# Patient Record
Sex: Female | Born: 1960 | Race: Black or African American | Hispanic: No | Marital: Married | Smoking: Former smoker
Health system: Southern US, Community
[De-identification: ages and names within clinical notes are randomized; demographics above are authoritative.]

## PROBLEM LIST (undated history)

## (undated) DIAGNOSIS — D869 Sarcoidosis, unspecified: Secondary | ICD-10-CM

## (undated) DIAGNOSIS — N1832 Chronic kidney disease, stage 3b: Secondary | ICD-10-CM

## (undated) DIAGNOSIS — E079 Disorder of thyroid, unspecified: Secondary | ICD-10-CM

## (undated) DIAGNOSIS — E785 Hyperlipidemia, unspecified: Secondary | ICD-10-CM

## (undated) DIAGNOSIS — Z9889 Other specified postprocedural states: Secondary | ICD-10-CM

## (undated) DIAGNOSIS — K219 Gastro-esophageal reflux disease without esophagitis: Secondary | ICD-10-CM

## (undated) DIAGNOSIS — Z87442 Personal history of urinary calculi: Secondary | ICD-10-CM

## (undated) DIAGNOSIS — R112 Nausea with vomiting, unspecified: Secondary | ICD-10-CM

## (undated) DIAGNOSIS — L309 Dermatitis, unspecified: Secondary | ICD-10-CM

## (undated) DIAGNOSIS — D649 Anemia, unspecified: Secondary | ICD-10-CM

## (undated) DIAGNOSIS — I10 Essential (primary) hypertension: Secondary | ICD-10-CM

## (undated) DIAGNOSIS — G4730 Sleep apnea, unspecified: Secondary | ICD-10-CM

## (undated) DIAGNOSIS — M109 Gout, unspecified: Secondary | ICD-10-CM

## (undated) DIAGNOSIS — T8859XA Other complications of anesthesia, initial encounter: Secondary | ICD-10-CM

## (undated) DIAGNOSIS — C73 Malignant neoplasm of thyroid gland: Secondary | ICD-10-CM

## (undated) DIAGNOSIS — H409 Unspecified glaucoma: Secondary | ICD-10-CM

## (undated) DIAGNOSIS — K743 Primary biliary cirrhosis: Secondary | ICD-10-CM

## (undated) DIAGNOSIS — K7581 Nonalcoholic steatohepatitis (NASH): Secondary | ICD-10-CM

## (undated) DIAGNOSIS — E119 Type 2 diabetes mellitus without complications: Secondary | ICD-10-CM

## (undated) DIAGNOSIS — Z85850 Personal history of malignant neoplasm of thyroid: Secondary | ICD-10-CM

## (undated) HISTORY — DX: Nonalcoholic steatohepatitis (NASH): K75.81

## (undated) HISTORY — DX: Dermatitis, unspecified: L30.9

## (undated) HISTORY — DX: Malignant neoplasm of thyroid gland: C73

## (undated) HISTORY — PX: COLONOSCOPY: SHX174

## (undated) HISTORY — PX: CATARACT EXTRACTION: SUR2

## (undated) HISTORY — DX: Hyperlipidemia, unspecified: E78.5

## (undated) HISTORY — DX: Primary biliary cirrhosis: K74.3

## (undated) HISTORY — PX: TUBAL LIGATION: SHX77

## (undated) HISTORY — DX: Type 2 diabetes mellitus without complications: E11.9

## (undated) HISTORY — DX: Gastro-esophageal reflux disease without esophagitis: K21.9

## (undated) HISTORY — PX: GASTRIC BYPASS: SHX52

## (undated) HISTORY — DX: Unspecified glaucoma: H40.9

## (undated) HISTORY — PX: OTHER SURGICAL HISTORY: SHX169

## (undated) HISTORY — DX: Sarcoidosis, unspecified: D86.9

## (undated) HISTORY — DX: Personal history of malignant neoplasm of thyroid: Z85.850

## (undated) HISTORY — DX: Essential (primary) hypertension: I10

## (undated) HISTORY — DX: Gout, unspecified: M10.9

## (undated) HISTORY — DX: Disorder of thyroid, unspecified: E07.9

## (undated) HISTORY — DX: Chronic kidney disease, stage 3b: N18.32

## (undated) HISTORY — DX: Anemia, unspecified: D64.9

---

## 2015-12-12 HISTORY — PX: KNEE SURGERY: SHX244

## 2016-12-11 LAB — HM COLONOSCOPY

## 2020-07-13 LAB — HM DIABETES EYE EXAM

## 2020-12-31 LAB — HM DIABETES EYE EXAM

## 2021-07-13 ENCOUNTER — Ambulatory Visit: Payer: Managed Care, Other (non HMO) | Admitting: Medical

## 2021-07-13 ENCOUNTER — Other Ambulatory Visit: Payer: Self-pay

## 2021-07-13 ENCOUNTER — Encounter: Payer: Self-pay | Admitting: Medical

## 2021-07-13 ENCOUNTER — Other Ambulatory Visit: Payer: Self-pay | Admitting: Medical

## 2021-07-13 ENCOUNTER — Ambulatory Visit
Admission: RE | Admit: 2021-07-13 | Discharge: 2021-07-13 | Disposition: A | Discharge status: Home / Self Care | Payer: Managed Care, Other (non HMO) | Source: Ambulatory Visit | Attending: Medical | Admitting: Medical

## 2021-07-13 VITALS — BP 120/80 | HR 88 | Ht 62.5 in | Wt 220.8 lb

## 2021-07-13 DIAGNOSIS — G8929 Other chronic pain: Secondary | ICD-10-CM | POA: Insufficient documentation

## 2021-07-13 DIAGNOSIS — M542 Cervicalgia: Secondary | ICD-10-CM | POA: Diagnosis not present

## 2021-07-13 DIAGNOSIS — N3941 Urge incontinence: Secondary | ICD-10-CM

## 2021-07-13 DIAGNOSIS — E790 Hyperuricemia without signs of inflammatory arthritis and tophaceous disease: Secondary | ICD-10-CM

## 2021-07-13 DIAGNOSIS — L309 Dermatitis, unspecified: Secondary | ICD-10-CM

## 2021-07-13 DIAGNOSIS — R159 Full incontinence of feces: Secondary | ICD-10-CM

## 2021-07-13 DIAGNOSIS — E785 Hyperlipidemia, unspecified: Secondary | ICD-10-CM

## 2021-07-13 DIAGNOSIS — M549 Dorsalgia, unspecified: Secondary | ICD-10-CM | POA: Diagnosis not present

## 2021-07-13 DIAGNOSIS — Z96652 Presence of left artificial knee joint: Secondary | ICD-10-CM | POA: Insufficient documentation

## 2021-07-13 DIAGNOSIS — G4700 Insomnia, unspecified: Secondary | ICD-10-CM

## 2021-07-13 DIAGNOSIS — R7989 Other specified abnormal findings of blood chemistry: Secondary | ICD-10-CM | POA: Insufficient documentation

## 2021-07-13 DIAGNOSIS — E890 Postprocedural hypothyroidism: Secondary | ICD-10-CM

## 2021-07-13 DIAGNOSIS — R202 Paresthesia of skin: Secondary | ICD-10-CM

## 2021-07-13 DIAGNOSIS — Z85850 Personal history of malignant neoplasm of thyroid: Secondary | ICD-10-CM

## 2021-07-13 DIAGNOSIS — R7301 Impaired fasting glucose: Secondary | ICD-10-CM | POA: Insufficient documentation

## 2021-07-13 DIAGNOSIS — I10 Essential (primary) hypertension: Secondary | ICD-10-CM

## 2021-07-13 DIAGNOSIS — Z9889 Other specified postprocedural states: Secondary | ICD-10-CM

## 2021-07-13 DIAGNOSIS — M1A9XX0 Chronic gout, unspecified, without tophus (tophi): Secondary | ICD-10-CM

## 2021-07-13 MED ORDER — TRIAMCINOLONE ACETONIDE 0.1 % EX CREA: 1.0000 "application " | TOPICAL_CREAM | Freq: Two times a day (BID) | CUTANEOUS | 0 refills | Status: DC

## 2021-07-13 MED ORDER — ZOLPIDEM TARTRATE ER 12.5 MG PO TBCR: 12.5000 mg | EXTENDED_RELEASE_TABLET | Freq: Every evening | ORAL | 0 refills | Status: DC | PRN

## 2021-07-13 NOTE — Patient Instructions (Signed)
Please go to [facility] for your  back xrays.   Their hours are 8am - 4:30 pm Monday - Friday.  Take your insurance card with you.  [facility] [phone]  [facility] [address], [place], [place] [address]  [address] [place], [place] [address]

## 2021-07-13 NOTE — Progress Notes (Signed)
Subjective:  Lauren [name] is a 60 y.o. female who presents for Chief Complaint  Patient presents with   new pt get established    Rash on arms x 1 month, back pain. Seen back in february 2022 for this.  Depression      Here as a new patient to establish care.   Moved to [facility] in May from [place].  Has back issues.  Had MRI of cervical spine 01/2021.  Gets pain in neck and throughout back upper and lower back since 12/2020.   No injury, no trauma, no fall.  Has hx/o poor posture, slouching, and hx/o rotator cuff repair both shoulder.  Hasn't seen back specialist.   The MRI was ordered by ortho since they were seeing her for the shoulder issues.  Has hx/o knee pains.   Lately hip and low back has been an issues.  No prior imaging of low back or hips.  Lately neck and upper back not as bad.   Using ibuprofen '800mg'$  some.   Aleve OTC really helps, but she note shx/o kidney issues.  She had cannabis prescription in [place], and would use vape for this.  She reports she is clumsy.  Sometimes legs give out.   Denies numbness or tingling in legs, but has had some weakness.  Currently unemployed.    Exercise - walks 2-3 miles per day.  Stretches regularly.   Occasionally gets numbness in hands, which is what led to the neck MRI.  No other numbness, tingling or weakness.   Has had some incontinence with stood at times, also with urine at times.  Currently feels she can control bowels and bladder ok.  No saddle anesthesia.    She notes rash on arms at antecubital area and neck x 1 mo.   Sometimes gets on dorsal hands if out in sun too long .  Is itchy, using OTC cream for itching and rash.  She notes hx/o thyroid cancer, s/p thyroidectomy.    She has a history of gout.  She is not on any daily prevention medicine.  She has as needed Indocin '50mg'$  BID  She had lab work done in January 2022.  She has this on her phone record  High blood pressure-compliant with medication  Hyperlipidemia-compliant with  medication  GERD-compliant with famotidine  No other aggravating or relieving factors.    No other c/o.  Past Medical History:  Diagnosis Date   Anemia    History of thyroid cancer    Hyperlipidemia    Hypertension    Thyroid disease     Current Outpatient Medications on File Prior to Visit  Medication Sig Dispense Refill   amLODipine-valsartan (EXFORGE) 5-160 MG tablet Take 1 tablet by mouth daily.     atorvastatin (LIPITOR) 80 MG tablet Take 80 mg by mouth daily.     chlorthalidone (HYGROTON) 25 MG tablet Take 25 mg by mouth daily.     diclofenac (VOLTAREN) 75 MG EC tablet Take 75 mg by mouth 2 (two) times daily.     famotidine (PEPCID) 20 MG tablet Take 20 mg by mouth 2 (two) times daily.     indomethacin (INDOCIN) 50 MG capsule Take 50 mg by mouth 2 (two) times daily with a meal.     No current facility-administered medications on file prior to visit.      The following portions of the patient's history were reviewed and updated as appropriate: allergies, current medications, past family history, past medical history, past social history,  past surgical history and problem list.  ROS Otherwise as in subjective above    Objective: BP 120/80   Pulse 88   Ht 5' 2.5" (1.588 m)   Wt 220 lb 12.8 oz (100.2 kg)   SpO2 98%   BMI 39.74 kg/m   General appearance: alert, no distress, well developed, well nourished, obese African American female Back: mild left lumbar parapsinal and midline lumbar spine tendnerss.  No swelling, no deformity.  Back flexion to about 100 degrees, ext normal.  No obvious scoliosis Hips with normal ROM bilat without pain, nontender, no obvious swelling or deformity Tender over left knee and there is a surgical scar of left knee, no swelling, no other leg tenderness or deformity Legs seem neurovascularly intact Arms also seen neurovascularly intact without tenderness or deformity Heart: RRR, normal S1, S2, no murmurs Lungs: CTA bilaterally, no  wheezes, rhonchi, or rales Pulses: 2+ radial pulses, 2+ pedal pulses, normal cap refill Ext: no edema Psych: Pleasant, answers questions appropriately     Assessment: Encounter Diagnoses  Name Primary?   Chronic neck pain Yes   Chronic back pain, unspecified back location, unspecified back pain laterality    Urge incontinence of urine    Incontinence of feces, unspecified fecal incontinence type    Paresthesia of arm    Eczema, unspecified type    H/O left knee surgery    History of thyroid cancer    Essential hypertension, benign    Hyperlipidemia, unspecified hyperlipidemia type    Postoperative hypothyroidism    Impaired fasting blood sugar    Elevated uric acid in blood    Elevated serum creatinine    Insomnia, unspecified type    Chronic gout without tophus, unspecified cause, unspecified site      Plan: Today we reviewed over her numerous issues.  She has a lot of chronic issues.  I reviewed MRI report from cervical spine from 01/2021 on her phone.  It showed cervical spondylosis and recent narrowing in C4-C5, C5-C6.    I reviewed labs she had on her phone from a chart from January 2022.  Labs showed uric acid over 10, hemoglobin A1c was over 9%, glucose was about 166, triglycerides over 300, CBC normal, creatinine elevated around 1.5, alkaline phosphatase was elevated.  Rest of labs seem within normal limits  Given her ongoing back pain this we will send for baseline x-rays.  She will likely need referral to a back specialist  She may need referral to gastro specialist given the fecal incontinence occasionally.  She will call back with the dose of her thyroid medicine since she does not have her pill bottle today.  Hypertension-continue current medication  Hyperlipidemia-continue current medication  Update labs as below today  Insomnia-refilled Ambien for as needed use.  Cautioned on limiting use of this and discussed the risk and benefits of medication  Given  elevated creatinine and her medication prior kidney issues, advised we avoid NSAIDs.  I did not refill diclofenac today and advised we will need to find other solutions to Arthritis and pain given kidney issues.  Elevated uric acid, hx/o gout - will likely need to start preventative medication such as allopurinol    Lauren [name] was seen today for new pt get established.  Diagnoses and all orders for this visit:  Chronic neck pain -     DG Lumbar Spine Complete; Future -     DG Thoracic Spine 2 View; Future  Chronic back pain, unspecified back location, unspecified back  pain laterality -     DG Lumbar Spine Complete; Future -     DG Thoracic Spine 2 View; Future  Urge incontinence of urine -     DG Lumbar Spine Complete; Future -     DG Thoracic Spine 2 View; Future  Incontinence of feces, unspecified fecal incontinence type -     DG Lumbar Spine Complete; Future -     DG Thoracic Spine 2 View; Future  Paresthesia of arm  Eczema, unspecified type  H/O left knee surgery  History of thyroid cancer  Essential hypertension, benign -     Comprehensive metabolic panel  Hyperlipidemia, unspecified hyperlipidemia type  Postoperative hypothyroidism -     TSH + free T4  Impaired fasting blood sugar -     Hemoglobin A1c  Elevated uric acid in blood -     Uric acid  Elevated serum creatinine  Insomnia, unspecified type  Chronic gout without tophus, unspecified cause, unspecified site  Other orders -     triamcinolone cream (KENALOG) 0.1 %; Apply 1 application topically 2 (two) times daily. -     zolpidem (AMBIEN CR) 12.5 MG CR tablet; Take 1 tablet (12.5 mg total) by mouth at bedtime as needed for sleep.  Spent > 45 minutes face to face with patient in discussion of symptoms, evaluation, plan and recommendations.     Follow up: pending xrays

## 2021-07-14 LAB — TSH+FREE T4
Free T4: 1.07 ng/dL (ref 0.82–1.77)
TSH: 3.11 u[IU]/mL (ref 0.450–4.500)

## 2021-07-14 LAB — COMPREHENSIVE METABOLIC PANEL
ALT: 43 IU/L — ABNORMAL HIGH (ref 0–32)
AST: 49 IU/L — ABNORMAL HIGH (ref 0–40)
Albumin/Globulin Ratio: 1.8 (ref 1.2–2.2)
Albumin: 5.2 g/dL — ABNORMAL HIGH (ref 3.8–4.9)
Alkaline Phosphatase: 236 IU/L — ABNORMAL HIGH (ref 44–121)
BUN/Creatinine Ratio: 14 (ref 9–23)
BUN: 26 mg/dL — ABNORMAL HIGH (ref 6–24)
Bilirubin Total: 0.3 mg/dL (ref 0.0–1.2)
CO2: 25 mmol/L (ref 20–29)
Calcium: 10.6 mg/dL — ABNORMAL HIGH (ref 8.7–10.2)
Chloride: 102 mmol/L (ref 96–106)
Creatinine, Ser: 1.84 mg/dL — ABNORMAL HIGH (ref 0.57–1.00)
Globulin, Total: 2.9 g/dL (ref 1.5–4.5)
Glucose: 124 mg/dL — ABNORMAL HIGH (ref 65–99)
Potassium: 4.1 mmol/L (ref 3.5–5.2)
Sodium: 144 mmol/L (ref 134–144)
Total Protein: 8.1 g/dL (ref 6.0–8.5)
eGFR: 31 mL/min/{1.73_m2} — ABNORMAL LOW (ref >59)

## 2021-07-14 LAB — HEMOGLOBIN A1C
Est. average glucose Bld gHb Est-mCnc: 183 mg/dL
Hgb A1c MFr Bld: 8 % — ABNORMAL HIGH (ref 4.8–5.6)

## 2021-07-14 LAB — URIC ACID: Uric Acid: 10.6 mg/dL — ABNORMAL HIGH (ref 3.0–7.2)

## 2021-07-18 ENCOUNTER — Other Ambulatory Visit: Payer: Self-pay

## 2021-07-18 ENCOUNTER — Telehealth: Payer: Self-pay

## 2021-07-18 ENCOUNTER — Ambulatory Visit: Payer: Managed Care, Other (non HMO) | Admitting: Medical

## 2021-07-18 VITALS — BP 122/72 | HR 69 | Wt 220.2 lb

## 2021-07-18 DIAGNOSIS — E890 Postprocedural hypothyroidism: Secondary | ICD-10-CM | POA: Diagnosis not present

## 2021-07-18 DIAGNOSIS — Z794 Long term (current) use of insulin: Secondary | ICD-10-CM | POA: Insufficient documentation

## 2021-07-18 DIAGNOSIS — E119 Type 2 diabetes mellitus without complications: Secondary | ICD-10-CM | POA: Insufficient documentation

## 2021-07-18 DIAGNOSIS — E790 Hyperuricemia without signs of inflammatory arthritis and tophaceous disease: Secondary | ICD-10-CM

## 2021-07-18 DIAGNOSIS — R748 Abnormal levels of other serum enzymes: Secondary | ICD-10-CM | POA: Insufficient documentation

## 2021-07-18 DIAGNOSIS — M1A9XX0 Chronic gout, unspecified, without tophus (tophi): Secondary | ICD-10-CM

## 2021-07-18 DIAGNOSIS — I10 Essential (primary) hypertension: Secondary | ICD-10-CM | POA: Diagnosis not present

## 2021-07-18 DIAGNOSIS — E785 Hyperlipidemia, unspecified: Secondary | ICD-10-CM | POA: Diagnosis not present

## 2021-07-18 DIAGNOSIS — Z85850 Personal history of malignant neoplasm of thyroid: Secondary | ICD-10-CM

## 2021-07-18 DIAGNOSIS — E1165 Type 2 diabetes mellitus with hyperglycemia: Secondary | ICD-10-CM

## 2021-07-18 DIAGNOSIS — N1832 Chronic kidney disease, stage 3b: Secondary | ICD-10-CM

## 2021-07-18 MED ORDER — ALLOPURINOL 100 MG PO TABS: 100.0000 mg | ORAL_TABLET | Freq: Every day | ORAL | 6 refills | Status: DC

## 2021-07-18 MED ORDER — EMPAGLIFLOZIN 10 MG PO TABS: 10.0000 mg | ORAL_TABLET | Freq: Every day | ORAL | 2 refills | Status: DC

## 2021-07-18 MED ORDER — FREESTYLE LIBRE 14 DAY SENSOR MISC: 2.0000 | 5 refills | Status: DC

## 2021-07-18 NOTE — Telephone Encounter (Signed)
Per [name] labs for tomorrow do not need to be fasting.  Tried to reach pt by phone and unable to leave message

## 2021-07-18 NOTE — Progress Notes (Signed)
Subjective:  Lauren [name] is a 60 y.o. female who presents for Chief Complaint  Patient presents with   discuss lab results    Discuss lab results.    Here with husband to discuss the recent abnormal labs from last visit.   She notes hx/o thyroid cancer, s/p thyroidectomy.    She has a history of gout.  She is not on any daily prevention medicine.  She has as needed Indocin '50mg'$  BID  She had lab work done in January 2022.  She has this on her phone record  High blood pressure-compliant with medication  Hyperlipidemia-compliant with medication  GERD-compliant with famotidine   Past Medical History:  Diagnosis Date   Anemia    History of thyroid cancer    Hyperlipidemia    Hypertension    Thyroid disease    Current Outpatient Medications on File Prior to Visit  Medication Sig Dispense Refill   amLODipine-valsartan (EXFORGE) 5-160 MG tablet Take 1 tablet by mouth daily.     atorvastatin (LIPITOR) 80 MG tablet Take 80 mg by mouth daily.     chlorthalidone (HYGROTON) 25 MG tablet Take 12.5 mg by mouth daily.     famotidine (PEPCID) 20 MG tablet Take 20 mg by mouth 2 (two) times daily.     triamcinolone cream (KENALOG) 0.1 % Apply 1 application topically 2 (two) times daily. 30 g 0   zolpidem (AMBIEN CR) 12.5 MG CR tablet Take 1 tablet (12.5 mg total) by mouth at bedtime as needed for sleep. 30 tablet 0   No current facility-administered medications on file prior to visit.   ROS as in subjective    Objective: BP 122/72   Pulse 69   Wt 220 lb 3.2 oz (99.9 kg)   BMI 39.63 kg/m   Gen: wd, wn, nad Otherwise not examined    Assessment: Encounter Diagnoses  Name Primary?   Essential hypertension, benign Yes   Stage 3b chronic kidney disease (HCC)    Postoperative hypothyroidism    Hyperlipidemia, unspecified hyperlipidemia type    History of thyroid cancer    Alkaline phosphatase elevation    Elevated uric acid in blood    Serum calcium elevated    Type 2  diabetes mellitus with hyperglycemia, without long-term current use of insulin (HCC)    Chronic gout without tophus, unspecified cause, unspecified site    Elevated liver enzymes      Plan: Diabetes Unfortunately your labs are in the diabetic range I recommend we begin Jardiance medication 1 tablet daily to help control blood sugars Inease water intake Monitor blood sugars and write these numbers down Begin monitoring with Free Style LIbre device    Chronic kidney disease stage IIIb Due to abnormal kidney function, and in order to protect your kidneys, I recommend you avoid medications that can harm the kidneys such as ibuprofen, Aleve, Advil, Motrin, Naprosyn, or prescription anti-inflammatories which are used for pain, inflammation, and arthritis.   You should avoid dehydration which can harm the kidneys. Hydrate with plenty of water such as 80 to 100 ounces of water daily The goal is to keep blood pressure and blood sugars under control Stop diclofenac   High cholesterol For now continue atorvastatin Lipitor 80 mg daily We are checking additional labs in regards to elevated liver test I suspect the liver tests are related to fatty liver disease However if we do not find any specific cause we may need to stop the atorvastatin temporarily   Alkaline phosphatase elevated  I am checking additional labs and regards to evaluation of this issue This could be due to low vitamin D or other issues   Elevated liver test Return at your convenience for additional labs We will plan to get an ultrasound of the liver as well I suspect your elevated liver test are due to fatty liver disease Recommendations for fatty liver disease is healthy low-fat diet regular exercise and weight loss   Elevated uric acid Begin allopurinol 100 mg daily for gout prevention and reducing uric acid in the blood For the first 2 weeks as you begin allopurinol, take your indomethacin/Indocin daily to help  reduce flareup of gout After 1 to 2 weeks I would stop the Indocin Other options for dealing with gout flare include Tylenol over-the-counter, tart cherry juice or other prescription medications I recommend we treat the elevated uric acid to protect the kidneys and to reduce gout For the meantime, reduce your chlorthalidone dose to 1/2 tablet daily    Lauren [name] was seen today for discuss lab results.  Diagnoses and all orders for this visit:  Essential hypertension, benign  Stage 3b chronic kidney disease ([place])  Postoperative hypothyroidism  Hyperlipidemia, unspecified hyperlipidemia type -     Hepatitis C antibody; Future -     Hepatitis B surface antigen; Future -     Iron, TIBC and Ferritin Panel; Future -     US Abdomen Limited; Future -     Alkaline Phosphatase, Isoenzymes; Future  History of thyroid cancer  Alkaline phosphatase elevation -     Hepatitis C antibody; Future -     Hepatitis B surface antigen; Future -     Iron, TIBC and Ferritin Panel; Future -     US Abdomen Limited; Future -     Alkaline Phosphatase, Isoenzymes; Future  Elevated uric acid in blood  Serum calcium elevated  Type 2 diabetes mellitus with hyperglycemia, without long-term current use of insulin (HCC)  Chronic gout without tophus, unspecified cause, unspecified site  Elevated liver enzymes -     Hepatitis C antibody; Future -     Hepatitis B surface antigen; Future -     Iron, TIBC and Ferritin Panel; Future -     US Abdomen Limited; Future -     Alkaline Phosphatase, Isoenzymes; Future   Spent > 45 minutes face to face with patient in discussion of symptoms, evaluation, plan and recommendations.     Follow up: this week for additional labs

## 2021-07-18 NOTE — Patient Instructions (Signed)
  Diabetes Unfortunately your labs are in the diabetic range I recommend we begin Jardiance medication 1 tablet daily to help control blood sugars Inease water intake Monitor blood sugars and write these numbers down Begin monitoring with Free Style LIbre device    Chronic kidney disease stage IIIb Due to abnormal kidney function, and in order to protect your kidneys, I recommend you avoid medications that can harm the kidneys such as ibuprofen, Aleve, Advil, Motrin, Naprosyn, or prescription anti-inflammatories which are used for pain, inflammation, and arthritis.   You should avoid dehydration which can harm the kidneys. Hydrate with plenty of water such as 80 to 100 ounces of water daily The goal is to keep blood pressure and blood sugars under control Stop diclofenac   High cholesterol For now continue atorvastatin Lipitor 80 mg daily We are checking additional labs in regards to elevated liver test I suspect the liver tests are related to fatty liver disease However if we do not find any specific cause we may need to stop the atorvastatin temporarily   Alkaline phosphatase elevated I am checking additional labs and regards to evaluation of this issue This could be due to low vitamin D or other issues   Elevated liver test Return at your convenience for additional labs We will plan to get an ultrasound of the liver as well I suspect your elevated liver test are due to fatty liver disease Recommendations for fatty liver disease is healthy low-fat diet regular exercise and weight loss   Elevated uric acid Begin allopurinol 100 mg daily for gout prevention and reducing uric acid in the blood For the first 2 weeks as you begin allopurinol, take your indomethacin/Indocin daily to help reduce flareup of gout After 1 to 2 weeks I would stop the Indocin Other options for dealing with gout flare include Tylenol over-the-counter, tart cherry juice or other prescription medications I  recommend we treat the elevated uric acid to protect the kidneys and to reduce gout For the meantime, reduce your chlorthalidone dose to 1/2 tablet daily

## 2021-07-19 ENCOUNTER — Other Ambulatory Visit: Payer: Managed Care, Other (non HMO)

## 2021-07-19 DIAGNOSIS — E785 Hyperlipidemia, unspecified: Secondary | ICD-10-CM

## 2021-07-19 DIAGNOSIS — R748 Abnormal levels of other serum enzymes: Secondary | ICD-10-CM

## 2021-07-20 ENCOUNTER — Encounter: Payer: Self-pay | Admitting: Internal Medicine

## 2021-07-22 LAB — HEPATITIS C ANTIBODY: Hep C Virus Ab: 0.2 s/co ratio (ref 0.0–0.9)

## 2021-07-22 LAB — ALKALINE PHOSPHATASE, ISOENZYMES
Alkaline Phosphatase: 206 IU/L — ABNORMAL HIGH (ref 44–121)
BONE FRACTION: 19 % (ref 14–68)
INTESTINAL FRAC.: 6 % (ref 0–18)
LIVER FRACTION: 75 % (ref 18–85)

## 2021-07-22 LAB — IRON,TIBC AND FERRITIN PANEL
Ferritin: 143 ng/mL (ref 15–150)
Iron Saturation: 24 % (ref 15–55)
Iron: 74 ug/dL (ref 27–159)
Total Iron Binding Capacity: 305 ug/dL (ref 250–450)
UIBC: 231 ug/dL (ref 131–425)

## 2021-07-22 LAB — HEPATITIS B SURFACE ANTIGEN: Hepatitis B Surface Ag: NEGATIVE

## 2021-07-25 ENCOUNTER — Telehealth: Payer: Self-pay

## 2021-07-25 NOTE — Telephone Encounter (Signed)
P.A. JARDIANCE completed and approved, called pharmacy went thru for $25.  Activated discount card went thru for $10.  Called pt and informed

## 2021-07-26 ENCOUNTER — Telehealth: Payer: Self-pay | Admitting: Internal Medicine

## 2021-07-26 ENCOUNTER — Encounter: Payer: Self-pay | Admitting: Internal Medicine

## 2021-07-26 NOTE — Telephone Encounter (Signed)
Patient wants to know about jardiance. She wants to know if she should be taking this since she has chronic kidney disease.   Also she wants to know what her BS should be reading range

## 2021-07-26 NOTE — Telephone Encounter (Signed)
Tried to call pt but vm is not set up

## 2021-07-27 ENCOUNTER — Encounter: Payer: Self-pay | Admitting: Internal Medicine

## 2021-07-27 ENCOUNTER — Other Ambulatory Visit: Payer: Self-pay | Admitting: Medical

## 2021-07-27 MED ORDER — INSULIN GLARGINE (1 UNIT DIAL) 300 UNIT/ML ~~LOC~~ SOPN: 10.0000 [IU] | PEN_INJECTOR | Freq: Every day | SUBCUTANEOUS | 5 refills | Status: DC

## 2021-07-27 MED ORDER — BD PEN NEEDLE NANO U/F 32G X 4 MM MISC: 1.0000 | Freq: Every day | 2 refills | Status: DC

## 2021-07-27 NOTE — Telephone Encounter (Signed)
Tried to call pt but vm full

## 2021-07-27 NOTE — Telephone Encounter (Signed)
Pt will be willing to start on insulin 10 units at night. Please send something in.   Also patient wants to know if you can send in a prenatal vitamin to her pharmacy. Her last doctor said she was deficient in vitamins and told her to go on prenatal vitamin but never followed up

## 2021-07-28 ENCOUNTER — Encounter: Payer: Self-pay | Admitting: Internal Medicine

## 2021-07-29 ENCOUNTER — Encounter: Payer: Self-pay | Admitting: Internal Medicine

## 2021-08-01 ENCOUNTER — Ambulatory Visit
Admission: RE | Admit: 2021-08-01 | Discharge: 2021-08-01 | Disposition: A | Discharge status: Home / Self Care | Payer: Managed Care, Other (non HMO) | Source: Ambulatory Visit | Attending: Medical | Admitting: Medical

## 2021-08-01 DIAGNOSIS — R748 Abnormal levels of other serum enzymes: Secondary | ICD-10-CM

## 2021-08-01 DIAGNOSIS — E785 Hyperlipidemia, unspecified: Secondary | ICD-10-CM

## 2021-08-04 ENCOUNTER — Other Ambulatory Visit: Payer: Self-pay | Admitting: Medical

## 2021-08-04 ENCOUNTER — Telehealth: Payer: Self-pay | Admitting: Internal Medicine

## 2021-08-04 MED ORDER — PRENATAL VITAMIN 27-0.8 MG PO TABS: 1.0000 | ORAL_TABLET | Freq: Every day | ORAL | 3 refills | Status: DC

## 2021-08-04 NOTE — Telephone Encounter (Signed)
Pt would like a prenatal vitamin [facility]

## 2021-08-05 ENCOUNTER — Other Ambulatory Visit: Payer: Self-pay | Admitting: Medical

## 2021-08-05 MED ORDER — ALLOPURINOL 100 MG PO TABS: 100.0000 mg | ORAL_TABLET | Freq: Every day | ORAL | 1 refills | Status: DC

## 2021-08-05 MED ORDER — ATORVASTATIN CALCIUM 80 MG PO TABS: 80.0000 mg | ORAL_TABLET | Freq: Every day | ORAL | 1 refills | Status: DC

## 2021-08-05 MED ORDER — ZOLPIDEM TARTRATE ER 12.5 MG PO TBCR: 12.5000 mg | EXTENDED_RELEASE_TABLET | Freq: Every evening | ORAL | 0 refills | Status: DC | PRN

## 2021-08-05 MED ORDER — FREESTYLE LIBRE 14 DAY SENSOR MISC: 2.0000 | 3 refills | Status: DC

## 2021-08-05 MED ORDER — INSULIN GLARGINE (1 UNIT DIAL) 300 UNIT/ML ~~LOC~~ SOPN: 10.0000 [IU] | PEN_INJECTOR | Freq: Every day | SUBCUTANEOUS | 1 refills | Status: DC

## 2021-08-10 LAB — HM DIABETES EYE EXAM

## 2021-08-11 ENCOUNTER — Encounter: Payer: Self-pay | Admitting: Internal Medicine

## 2021-08-29 ENCOUNTER — Ambulatory Visit: Payer: Managed Care, Other (non HMO) | Admitting: Family Medicine

## 2021-08-29 ENCOUNTER — Encounter: Payer: Self-pay | Admitting: Family Medicine

## 2021-08-29 ENCOUNTER — Encounter: Payer: Self-pay | Admitting: Internal Medicine

## 2021-08-29 ENCOUNTER — Other Ambulatory Visit: Payer: Self-pay

## 2021-08-29 VITALS — BP 138/88 | HR 80 | Ht 62.5 in | Wt 216.4 lb

## 2021-08-29 DIAGNOSIS — M1A9XX0 Chronic gout, unspecified, without tophus (tophi): Secondary | ICD-10-CM | POA: Diagnosis not present

## 2021-08-29 DIAGNOSIS — E1122 Type 2 diabetes mellitus with diabetic chronic kidney disease: Secondary | ICD-10-CM

## 2021-08-29 DIAGNOSIS — I10 Essential (primary) hypertension: Secondary | ICD-10-CM

## 2021-08-29 DIAGNOSIS — N1832 Chronic kidney disease, stage 3b: Secondary | ICD-10-CM | POA: Diagnosis not present

## 2021-08-29 DIAGNOSIS — Z794 Long term (current) use of insulin: Secondary | ICD-10-CM

## 2021-08-29 DIAGNOSIS — E790 Hyperuricemia without signs of inflammatory arthritis and tophaceous disease: Secondary | ICD-10-CM

## 2021-08-29 DIAGNOSIS — M79672 Pain in left foot: Secondary | ICD-10-CM | POA: Diagnosis not present

## 2021-08-29 LAB — HM PAP SMEAR: HM Pap smear: ABNORMAL

## 2021-08-29 LAB — RESULTS CONSOLE HPV: CHL HPV: NEGATIVE

## 2021-08-29 MED ORDER — PREDNISONE 10 MG PO TABS: ORAL_TABLET | ORAL | 0 refills | Status: DC

## 2021-08-29 MED ORDER — COLCHICINE 0.6 MG PO TABS
ORAL_TABLET | ORAL | 0 refills | Status: DC
Start: 2021-08-29 — End: 2021-09-15

## 2021-08-29 NOTE — Patient Instructions (Addendum)
  Go to [facility] ([address]) tomorrow to get an xray of your left foot. It might be gout, but could be other things (stress fracture, tendonitis).  We will try colchicine today, as this would treat any gout component. If pain is not better, start steroids tomorrow morning.  Take 40mg  for 2 days, then 30mg  for 1 days, then 20mg  for 2 days and 10mg  for 2 days.  If your sugars are still very high, you can taper down faster (by doing only 1 day each at 30 mg, 20mg  and 10mg ).  You should follow up with [name] within the next 1-2 weks. You should have your labs repeated, and possibly your allopurinol adjusted (vs changed to Uloric, depending on creatinine and uric acid levels).  If sugars are over 200 (fasting), increase your Toujeo to 13 Units, adjust as needed and contact us.

## 2021-08-29 NOTE — Progress Notes (Signed)
Chief Complaint  Patient presents with   Gout    Having a gout flare-first started with her right foot great toe, her foot is now swollen and painful-almost a week now. Takes daily allopurinol '100mg'$  daily, tart cherry supplement daily and has been taking tart cherry juice for the last 3-4 days.    Patient has a known history of gout. She presents today with complaint of foot pain.  The pain started Wednesday 9/14 in the left great toe, then pain spread to the whole foot (bottom of the foot along arch, and across the top of the foot. Heel is the only part not painful.) She took ibuprofen (per doctor on call '800mg'$ ), but didn't want to continue this because she knew it wasn't good for her kidneys. She had some leftover percocet from shoulder surgery--helped with pain, last dose yesterday.  She is a fairly new patient of [facility].  At last visit, she was told to cut the chlorthalidone in half, about a month ago.  Couldn't cut it in half, so stopped taking it. BP's at home have been 115-130/80 or less.  Has been walking 3 miles/day (none in the last 2 weeks, but didn't stop related to any pain)  Lab Results  Component Value Date   LABURIC 10.6 (H) 07/13/2021     Chemistry      Component Value Date/Time   NA 144 07/13/2021 1215   K 4.1 07/13/2021 1215   CL 102 07/13/2021 1215   CO2 25 07/13/2021 1215   BUN 26 (H) 07/13/2021 1215   CREATININE 1.84 (H) 07/13/2021 1215      Component Value Date/Time   CALCIUM 10.6 (H) 07/13/2021 1215   ALKPHOS 206 (H) 07/19/2021 1324   AST 49 (H) 07/13/2021 1215   ALT 43 (H) 07/13/2021 1215   BILITOT 0.3 07/13/2021 1215     DM--on insulin for about a month. On 10 Units of Toujeo.  Sugars are 130-140 range.  2 weeks ago a 1am she had at low at [address] [facility] alarm woke her up), no lows since.   Lab Results  Component Value Date   HGBA1C 8.0 (H) 07/13/2021   She also reports some issues with pain in her back/hips, and down both legs, that she has discussed  with [name] (had MRI in past).  She feels slight discomfort coming up her left calf.  Denies calf swelling.  PMH, PSH, SH reviewed  Outpatient Encounter Medications as of 08/29/2021  Medication Sig   allopurinol (ZYLOPRIM) 100 MG tablet Take 1 tablet (100 mg total) by mouth daily.   amLODipine-valsartan (EXFORGE) 5-160 MG tablet Take 1 tablet by mouth daily.   atorvastatin (LIPITOR) 80 MG tablet Take 1 tablet (80 mg total) by mouth daily.   Continuous Blood Gluc Sensor (FREESTYLE LIBRE 14 DAY SENSOR) MISC 2 each by Does not apply route every 14 (fourteen) days.   insulin glargine, 1 Unit Dial, (TOUJEO) 300 UNIT/ML Solostar Pen Inject 10 Units into the skin daily.   Insulin Pen Needle (BD PEN NEEDLE NANO U/F) 32G X 4 MM MISC 1 each by Does not apply route at bedtime.   LUMIGAN 0.01 % SOLN 1 drop at bedtime.   Misc Natural Products (TART CHERRY ADVANCED PO) Take 1 capsule by mouth daily.   Prenatal Vit-Fe Fumarate-FA (PRENATAL VITAMIN) 27-0.8 MG TABS Take 1 tablet by mouth daily.   triamcinolone cream (KENALOG) 0.1 % Apply 1 application topically 2 (two) times daily.   chlorthalidone (HYGROTON) 25 MG tablet Take  12.5 mg by mouth daily. (Patient not taking: Reported on 08/29/2021)   famotidine (PEPCID) 20 MG tablet Take 20 mg by mouth 2 (two) times daily. (Patient not taking: Reported on 08/29/2021)   zolpidem (AMBIEN CR) 12.5 MG CR tablet Take 1 tablet (12.5 mg total) by mouth at bedtime as needed for sleep. (Patient not taking: Reported on 08/29/2021)   No facility-administered encounter medications on file as of 08/29/2021.   Allergies  Allergen Reactions   Oxycodone Itching   ROS:  No fever, some nausea today, no vomiting. No URI symptoms, headaches, dizziness, chest pain, abdominal pain, dysuria. No hypoglycemia (didn't have sx with glu 68), no polydipsia, polyuria. See HPI.   PHYSICAL EXAM:  BP 138/88   Pulse 80   Ht 5' 2.5" (1.588 m)   Wt 216 lb 6.4 oz (98.2 kg)   BMI 38.95 kg/m    Pleasant, obese female, in a wheelchair, accompanied by her husband. HEENT: conjunctiva and sclera are clear, wearing mask Extremities: normal pulses.  Some mild erythema across the top of the left foot (proximal to mid metatarsals). Some inflammation at the L 1st MTP noted, and some mild erythema and swelling noted across the toes. Nontender at plantar fascia insertion at calcaneous. No pitting edema, calves are nontender, no cords. She is alert and oriented. Gait not observed (in wheelchair due to foot pain). Psych: normal mood, affect, hygiene and grooming  Glu 103 when she checked with her Freestyle while in exam room  ASSESSMENT/PLAN:  Left foot pain - Ddx reviewed. Sounds like started as gout. Cannot r/o stress fracture, OA as contributing. Check x-ray. Treat for gout - Plan: DG Foot Complete Left  Chronic gout without tophus, unspecified cause, unspecified site - Colchicine today. If not better, start prednisone--given DM, will avoid higher doses. Pt needs to be able to walk/bear weight in 2 days.  - Plan: colchicine 0.6 MG tablet, predniSONE (DELTASONE) 10 MG tablet  Type 2 diabetes mellitus with stage 3b chronic kidney disease, with long-term current use of insulin (HCC) - Cont monitoring sugar; discussed titration of Toujeo if sugars >200 due to steroids (then taper back down to avoid hypoglycemia)  Stage 3b chronic kidney disease (HCC) - Avoid NSAIDs  Essential hypertension, benign - In pain now. Per home values, BP okay since chlorthalidone stopped  Elevated uric acid in blood - stopped diuretic and started '100mg'$  allopurinol. Now poss in a flare, not good time to recheck. f/u with [name] within 2 weeks for recheck uric acid  I spent 40 minutes dedicated to the care of this patient, including pre-visit review of records, face to face time, post-visit ordering of testing and documentation.   Go to [facility] (either [address]) tomorrow to get an xray of  your left foot. It might be gout, but could be other things (stress fracture, tendonitis).  We will try colchicine today, as this would treat any gout component. If pain is not better, start steroids tomorrow morning.  Take '40mg'$  for 2 days, then '30mg'$  for 1 days, then '20mg'$  for 2 days and '10mg'$  for 2 days.  If your sugars are still very high, you can taper down faster (by doing only 1 day each at 30 mg, '20mg'$  and '10mg'$ ).  You should follow up with [name] within the next 1-2 weks. You should have your labs repeated, and possibly your allopurinol adjusted (vs changed to Uloric, depending on creatinine and uric acid levels).

## 2021-08-30 ENCOUNTER — Ambulatory Visit
Admission: RE | Admit: 2021-08-30 | Discharge: 2021-08-30 | Disposition: A | Discharge status: Home / Self Care | Payer: Managed Care, Other (non HMO) | Source: Ambulatory Visit | Attending: Family Medicine | Admitting: Family Medicine

## 2021-08-30 DIAGNOSIS — M79672 Pain in left foot: Secondary | ICD-10-CM

## 2021-09-06 ENCOUNTER — Encounter: Payer: Self-pay | Admitting: Internal Medicine

## 2021-09-08 ENCOUNTER — Encounter: Payer: Self-pay | Admitting: Medical

## 2021-09-12 ENCOUNTER — Encounter: Payer: Self-pay | Admitting: Medical

## 2021-09-12 ENCOUNTER — Ambulatory Visit: Payer: Managed Care, Other (non HMO) | Admitting: Medical

## 2021-09-12 ENCOUNTER — Other Ambulatory Visit: Payer: Self-pay

## 2021-09-12 VITALS — BP 128/80 | HR 76 | Wt 213.8 lb

## 2021-09-12 DIAGNOSIS — J301 Allergic rhinitis due to pollen: Secondary | ICD-10-CM | POA: Insufficient documentation

## 2021-09-12 DIAGNOSIS — R748 Abnormal levels of other serum enzymes: Secondary | ICD-10-CM

## 2021-09-12 DIAGNOSIS — N1832 Chronic kidney disease, stage 3b: Secondary | ICD-10-CM | POA: Diagnosis not present

## 2021-09-12 DIAGNOSIS — E790 Hyperuricemia without signs of inflammatory arthritis and tophaceous disease: Secondary | ICD-10-CM

## 2021-09-12 DIAGNOSIS — E1165 Type 2 diabetes mellitus with hyperglycemia: Secondary | ICD-10-CM

## 2021-09-12 DIAGNOSIS — M1A9XX0 Chronic gout, unspecified, without tophus (tophi): Secondary | ICD-10-CM

## 2021-09-12 DIAGNOSIS — M25572 Pain in left ankle and joints of left foot: Secondary | ICD-10-CM

## 2021-09-12 DIAGNOSIS — M25571 Pain in right ankle and joints of right foot: Secondary | ICD-10-CM | POA: Insufficient documentation

## 2021-09-12 DIAGNOSIS — E785 Hyperlipidemia, unspecified: Secondary | ICD-10-CM

## 2021-09-12 DIAGNOSIS — G4733 Obstructive sleep apnea (adult) (pediatric): Secondary | ICD-10-CM

## 2021-09-12 DIAGNOSIS — G4700 Insomnia, unspecified: Secondary | ICD-10-CM

## 2021-09-12 LAB — HM MAMMOGRAPHY

## 2021-09-12 NOTE — Progress Notes (Signed)
Subjective:  Lauren [name] is a 60 y.o. female who presents for Chief Complaint  Patient presents with   2 week follow-up    2 week Follow-up on gout .      Here for med check.  Diabetes, diagnosed earlier this year.  She had been prediabetic prior to this year.  She is compliant with Toujeo 10 units nightly but she has had some low readings.  She has a freestyle libre device and the alarms go off sometimes between 2 and 5 AM showing blood sugars down into the 68 or 69 range at times.  No foot concerns.  She does see her eye doctor yearly.  For the most part fasting blood sugars in the morning have been less than 130  Hyperlipidemia-compliant with statin without complaint.  She has had recent gout problems.  She was here recently and saw Dr. Tomi Bamberger for really bad gout flare, the worst she has had.  She came in a wheelchair.  The medicine she was given help although last week her right foot started to act up.  The left foot was the last 1 to act up.  She is taking allopurinol 100 mg daily.  She started the prednisone that was given last visit.  Sleep apnea-compliant with CPAP, last sleep study greater than 8 years ago  Insomnia-she takes Ambien CR but feels like it does not help as much of late.  She would like to try a different medication.  She can get the sleep fine but she seems to wake up early in the morning and cannot get back to sleep.  She had heard about a medication called Dwana Curd she was interested in trying  She saw a TV commercial about medication Carrington Clamp and was curious if she needed to be on this or not  She and her husband moved here in recent months.  She has not established with gastroenterology here  She sees new orthopedic tomorrow about her left knee , Dr. Vanetta Mulders  Lost 45 lb since early 2022 intentionally.  She also has history of gastric sleeve surgery  She was on chlorthalidone prior to stop this after last visit as she could not break them in half as we  discussed.  This was stopped due to potential for aggravating uric acid.  She is compliant with her other blood pressure medication amlodipine valsartan.  Home blood pressure readings lately has been running normal.  No other aggravating or relieving factors.    No other c/o.   Past Medical History:  Diagnosis Date   Anemia    History of thyroid cancer    Hyperlipidemia    Hypertension    Thyroid disease     Current Outpatient Medications on File Prior to Visit  Medication Sig Dispense Refill   allopurinol (ZYLOPRIM) 100 MG tablet Take 1 tablet (100 mg total) by mouth daily. 90 tablet 1   amLODipine-valsartan (EXFORGE) 5-160 MG tablet Take 1 tablet by mouth daily.     atorvastatin (LIPITOR) 80 MG tablet Take 1 tablet (80 mg total) by mouth daily. 90 tablet 1   chlorthalidone (HYGROTON) 25 MG tablet Take 12.5 mg by mouth daily.     colchicine 0.6 MG tablet Take 2 tablets at onset of gout pain.  Take an additional 1 tablet an hour later, if needed 3 tablet 0   Continuous Blood Gluc Sensor (FREESTYLE LIBRE 2 SENSOR) MISC by Does not apply route.     famotidine (PEPCID) 20 MG tablet Take 20  mg by mouth 2 (two) times daily.     insulin glargine, 1 Unit Dial, (TOUJEO) 300 UNIT/ML Solostar Pen Inject 10 Units into the skin daily. 15 mL 1   Insulin Pen Needle (BD PEN NEEDLE NANO U/F) 32G X 4 MM MISC 1 each by Does not apply route at bedtime. 100 each 2   LUMIGAN 0.01 % SOLN 1 drop at bedtime.     Misc Natural Products (TART CHERRY ADVANCED PO) Take 1 capsule by mouth daily.     predniSONE (DELTASONE) 10 MG tablet Take as directed. Take in the morning with food. 17 tablet 0   Prenatal Vit-Fe Fumarate-FA (PRENATAL VITAMIN) 27-0.8 MG TABS Take 1 tablet by mouth daily. 90 tablet 3   triamcinolone cream (KENALOG) 0.1 % Apply 1 application topically 2 (two) times daily. 30 g 0   zolpidem (AMBIEN CR) 12.5 MG CR tablet Take 1 tablet (12.5 mg total) by mouth at bedtime as needed for sleep. 90 tablet 0    No current facility-administered medications on file prior to visit.    The following portions of the patient's history were reviewed and updated as appropriate: allergies, current medications, past family history, past medical history, past social history, past surgical history and problem list.  ROS Otherwise as in subjective above    Objective: BP 128/80   Pulse 76   Wt 213 lb 12.8 oz (97 kg)   BMI 38.48 kg/m   BP Readings from Last 3 Encounters:  09/12/21 128/80  08/29/21 138/88  07/18/21 122/72   Wt Readings from Last 3 Encounters:  09/12/21 213 lb 12.8 oz (97 kg)  08/29/21 216 lb 6.4 oz (98.2 kg)  07/18/21 220 lb 3.2 oz (99.9 kg)    General appearance: alert, no distress, well developed, well nourished, African American female Neck: supple, no lymphadenopathy, no thyromegaly, no masses, no bruits Heart: RRR, normal S1, S2, no murmurs Lungs: CTA bilaterally, no wheezes, rhonchi, or rales Pulses: 2+ radial pulses, 2+ pedal pulses, normal cap refill Ext: no edema     Assessment: Encounter Diagnoses  Name Primary?   Type 2 diabetes mellitus with hyperglycemia, without long-term current use of insulin (HCC) Yes   Stage 3b chronic kidney disease (HCC)    Hyperlipidemia, unspecified hyperlipidemia type    Elevated uric acid in blood    Chronic gout without tophus, unspecified cause, unspecified site    Alkaline phosphatase elevation    Insomnia, unspecified type    Pain in joints of both feet    Allergic rhinitis due to pollen, unspecified seasonality    OSA (obstructive sleep apnea)       Plan: Diabetes Routine labs today She had been on metformin in the past before gastric sleeve surgery, but this was not started recently given kidney marker She is taking insulin at night 10 units of Toujeo at bedtime.  She does have some readings on her freestyle monitor showing 69 glucose on various days in the early hours such as 2 AM to 5 AM. Consider switching to  Toujeo in the morning.  Consider other medications to help control sugars  CKD 3 recheck labs today avoid NSAIDs discussed importance of keeping blood pressure and blood sugars in control consider nephrology consult  Hyperlipidemia Fasting labs today, continue statin  Elevated uric acid, chronic gout, joint pains Labs today, we will likely increase allopurinol to 300 mg daily She is currently on prednisone which is elevating her sugar some Avoid NSAIDs due to kidney function Other lab  evaluation today  Elevated LFTs , elevated alkaline phosphatase with liver fraction being the most predominant I reviewed over the labs we did back in August.  At that time negative hepatitis B and C, iron levels and ferritin normal, abdominal ultrasound showed fatty liver disease Continue efforts to lose weight through healthy diet and exercise Consider GI consult We discussed that liver biopsy would be the definitive answer to clearing up any uncertainty about her labs, but she has not had this prior  Insomnia We discussed the risk of sleep aids, sleep medications Consider updated sleep evaluation.  Her last sleep study was greater than 5 years ago We discussed medication options.  She feels like her Ambien is no longer working.  We discussed the new medicine she was inquiring about compared to other options like trazodone and Belsomra I will consider her options and get back in touch with her   Obstructive sleep apnea-continue CPAP   Allergic rhinitis-advise she use over-the-counter nasal spray such as Flonase or allergy pill such as Zyrtec daily   Tazaria was seen today for 2 week follow-up.  Diagnoses and all orders for this visit:  Type 2 diabetes mellitus with hyperglycemia, without long-term current use of insulin (HCC) -     Comprehensive metabolic panel -     Hemoglobin A1c  Stage 3b chronic kidney disease (HCC)  Hyperlipidemia, unspecified hyperlipidemia type -     Comprehensive  metabolic panel -     Lipid panel  Elevated uric acid in blood -     Uric acid  Chronic gout without tophus, unspecified cause, unspecified site -     Uric acid -     Sedimentation rate  Alkaline phosphatase elevation -     Comprehensive metabolic panel -     Sedimentation rate  Insomnia, unspecified type  Pain in joints of both feet -     Uric acid -     CBC -     CK -     Sedimentation rate  Allergic rhinitis due to pollen, unspecified seasonality  OSA (obstructive sleep apnea)  Spent > 45 minutes face to face with patient in discussion of symptoms, evaluation, plan and recommendations.    Follow up: pending labs

## 2021-09-13 ENCOUNTER — Ambulatory Visit (HOSPITAL_BASED_OUTPATIENT_CLINIC_OR_DEPARTMENT_OTHER)
Admission: RE | Admit: 2021-09-13 | Discharge: 2021-09-13 | Disposition: A | Discharge status: Home / Self Care | Payer: Managed Care, Other (non HMO) | Source: Ambulatory Visit | Attending: Orthopaedic Surgery | Admitting: Orthopaedic Surgery

## 2021-09-13 ENCOUNTER — Encounter (HOSPITAL_BASED_OUTPATIENT_CLINIC_OR_DEPARTMENT_OTHER): Payer: Self-pay | Admitting: Orthopaedic Surgery

## 2021-09-13 ENCOUNTER — Other Ambulatory Visit (HOSPITAL_BASED_OUTPATIENT_CLINIC_OR_DEPARTMENT_OTHER): Payer: Self-pay | Admitting: Orthopaedic Surgery

## 2021-09-13 ENCOUNTER — Ambulatory Visit (INDEPENDENT_AMBULATORY_CARE_PROVIDER_SITE_OTHER): Payer: Managed Care, Other (non HMO) | Admitting: Orthopaedic Surgery

## 2021-09-13 VITALS — BP 151/104 | Ht 62.0 in | Wt 208.0 lb

## 2021-09-13 DIAGNOSIS — Z9889 Other specified postprocedural states: Secondary | ICD-10-CM | POA: Insufficient documentation

## 2021-09-13 DIAGNOSIS — M1712 Unilateral primary osteoarthritis, left knee: Secondary | ICD-10-CM

## 2021-09-13 LAB — COMPREHENSIVE METABOLIC PANEL
ALT: 41 IU/L — ABNORMAL HIGH (ref 0–32)
AST: 25 IU/L (ref 0–40)
Albumin/Globulin Ratio: 1.8 (ref 1.2–2.2)
Albumin: 4.6 g/dL (ref 3.8–4.9)
Alkaline Phosphatase: 252 IU/L — ABNORMAL HIGH (ref 44–121)
BUN/Creatinine Ratio: 12 (ref 9–23)
BUN: 17 mg/dL (ref 6–24)
Bilirubin Total: <0.2 mg/dL (ref 0.0–1.2)
CO2: 23 mmol/L (ref 20–29)
Calcium: 9.8 mg/dL (ref 8.7–10.2)
Chloride: 104 mmol/L (ref 96–106)
Creatinine, Ser: 1.47 mg/dL — ABNORMAL HIGH (ref 0.57–1.00)
Globulin, Total: 2.6 g/dL (ref 1.5–4.5)
Glucose: 138 mg/dL — ABNORMAL HIGH (ref 70–99)
Potassium: 3.9 mmol/L (ref 3.5–5.2)
Sodium: 141 mmol/L (ref 134–144)
Total Protein: 7.2 g/dL (ref 6.0–8.5)
eGFR: 41 mL/min/{1.73_m2} — ABNORMAL LOW (ref >59)

## 2021-09-13 LAB — LIPID PANEL
Chol/HDL Ratio: 2.8 ratio (ref 0.0–4.4)
Cholesterol, Total: 160 mg/dL (ref 100–199)
HDL: 57 mg/dL (ref >39)
LDL Chol Calc (NIH): 64 mg/dL (ref 0–99)
Triglycerides: 246 mg/dL — ABNORMAL HIGH (ref 0–149)
VLDL Cholesterol Cal: 39 mg/dL (ref 5–40)

## 2021-09-13 LAB — CBC
Hematocrit: 36.5 % (ref 34.0–46.6)
Hemoglobin: 11.7 g/dL (ref 11.1–15.9)
MCH: 26.3 pg — ABNORMAL LOW (ref 26.6–33.0)
MCHC: 32.1 g/dL (ref 31.5–35.7)
MCV: 82 fL (ref 79–97)
Platelets: 427 10*3/uL (ref 150–450)
RBC: 4.45 x10E6/uL (ref 3.77–5.28)
RDW: 14.6 % (ref 11.7–15.4)
WBC: 8.9 10*3/uL (ref 3.4–10.8)

## 2021-09-13 LAB — HEMOGLOBIN A1C
Est. average glucose Bld gHb Est-mCnc: 160 mg/dL
Hgb A1c MFr Bld: 7.2 % — ABNORMAL HIGH (ref 4.8–5.6)

## 2021-09-13 LAB — SEDIMENTATION RATE: Sed Rate: 26 mm/hr (ref 0–40)

## 2021-09-13 LAB — CK: Total CK: 304 U/L — ABNORMAL HIGH (ref 32–182)

## 2021-09-13 LAB — URIC ACID: Uric Acid: 4.7 mg/dL (ref 3.0–7.2)

## 2021-09-13 NOTE — Progress Notes (Signed)
Chief Complaint: left knee     History of Present Illness:   Lauren [name] is a 60 y.o. female left knee pain status post patellofemoral knee replacement done in [place] 5 years prior.  She says that she has had pain following this although this is progressively worse over the last several years.  She endorses popping and clicking about the knee.  She has pain with weightbearing on the knee.  Her pain is worse at the end of a long day.  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  She has not tried any specific bracing.    Surgical History:   Left knee patellofemoral placement 2015  PMH/PSH/Family History/Social History/Meds/Allergies:    Past Medical History:  Diagnosis Date   Anemia    History of thyroid cancer    Hyperlipidemia    Hypertension    Thyroid disease    Past Surgical History:  Procedure Laterality Date   GASTRIC BYPASS     KNEE SURGERY Left 2017   Social History   Socioeconomic History   Marital status: Married    Spouse name: Not on file   Number of children: Not on file   Years of education: Not on file   Highest education level: Not on file  Occupational History   Not on file  Tobacco Use   Smoking status: Former    Types: E-cigarettes   Smokeless tobacco: Never  Substance and Sexual Activity   Alcohol use: Never   Drug use: Not Currently    Comment: gummies   Sexual activity: Yes  Other Topics Concern   Not on file  Social History Narrative   Not on file   Social Determinants of Health   Financial Resource Strain: Not on file  Food Insecurity: Not on file  Transportation Needs: Not on file  Physical Activity: Not on file  Stress: Not on file  Social Connections: Not on file   No family history on file. Allergies  Allergen Reactions   Oxycodone Itching   Current Outpatient Medications  Medication Sig Dispense Refill    allopurinol (ZYLOPRIM) 100 MG tablet Take 1 tablet (100 mg total) by mouth daily. 90 tablet 1   amLODipine-valsartan (EXFORGE) 5-160 MG tablet Take 1 tablet by mouth daily.     atorvastatin (LIPITOR) 80 MG tablet Take 1 tablet (80 mg total) by mouth daily. 90 tablet 1   chlorthalidone (HYGROTON) 25 MG tablet Take 12.5 mg by mouth daily.     colchicine 0.6 MG tablet Take 2 tablets at onset of gout pain.  Take an additional 1 tablet an hour later, if needed 3 tablet 0   Continuous Blood Gluc Sensor (FREESTYLE LIBRE 2 SENSOR) MISC by Does not apply route.     famotidine (PEPCID) 20 MG tablet Take 20 mg by mouth 2 (two) times daily.     insulin glargine, 1 Unit Dial, (TOUJEO) 300 UNIT/ML Solostar Pen Inject 10 Units into the skin daily. 15 mL 1   Insulin Pen Needle (BD PEN NEEDLE NANO U/F) 32G X 4 MM MISC 1 each by Does not apply route at bedtime. 100 each 2   LUMIGAN 0.01 % SOLN 1 drop at bedtime.     Misc Natural Products (TART CHERRY ADVANCED  PO) Take 1 capsule by mouth daily.     predniSONE (DELTASONE) 10 MG tablet Take as directed. Take in the morning with food. 17 tablet 0   Prenatal Vit-Fe Fumarate-FA (PRENATAL VITAMIN) 27-0.8 MG TABS Take 1 tablet by mouth daily. 90 tablet 3   triamcinolone cream (KENALOG) 0.1 % Apply 1 application topically 2 (two) times daily. 30 g 0   zolpidem (AMBIEN CR) 12.5 MG CR tablet Take 1 tablet (12.5 mg total) by mouth at bedtime as needed for sleep. 90 tablet 0   No current facility-administered medications for this visit.   No results found.  Review of Systems:   A ROS was performed including pertinent positives and negatives as documented in the HPI.  Physical Exam :   Constitutional: NAD and appears stated age Neurological: Alert and oriented Psych: Appropriate affect and cooperative Blood pressure (!) 151/104, height 5\' 2"  (1.575 m), weight 208 lb (94.3 kg).   Comprehensive Musculoskeletal Exam:     Musculoskeletal Exam  Gait Normal  Alignment  Normal   Right Left  Inspection Normal Normal  Palpation    Tenderness None Patellofemoral  Crepitus None Lateral patellar  Effusion None None  Range of Motion    Extension 0 0  Flexion 135 135  Strength    Extension 5/5 5/5  Flexion 5/5 5/5  Ligament Exam     Generalized Laxity No No  Lachman Negative Negative   Pivot Shift Negative Negative  Anterior Drawer Negative Negative  Valgus at 0 Negative Negative  Valgus at 20 Negative Negative  Varus at 0 0 0  Varus at 20   0 0  Posterior Drawer at 90 0 0  Vascular/Lymphatic Exam    Edema None None  Venous Stasis Changes No No  Distal Circulation Normal Normal  Neurologic    Light Touch Sensation Intact Intact  Special Tests:      Imaging:   Xray (4 views left knee): She has mild to moderate arthritis involving the predominantly medial compartment.  There is lateral patellar tilt with significant osteophyte formation about the lateral patellofemoral joint   I personally reviewed and interpreted the radiographs.   Assessment:   60 year old female with a history of left patellofemoral replacement in 2015.  Overall she continues to have persistent pain.  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  We discussed different treatment possibilities including bracing.  Specifically we will trial a patella strap brace in order to offload the developed lateral patellofemoral joint where she does have some residual impingement.  We tried this time today and she experienced significant improvement so would like to keep this.  I did also discussed with her injections.  I would like to stay away from steroid injections given the fact that she does have patellofemoral replacement in place.  We just discussed the role for PRP although I was somewhat hesitant on this given her residual lateral patellofemoral osteophytes which are more of a mechanical phenomenon.  She does have a history of lumbar disc herniation  which she was being previously treated in [place] for I do believe that she is a candidate for an epidural steroid injection given the fact that she is not able to take systemic anti-inflammatories due to her sarcoidosis.  We will plan for referral for possible injection.  Plan :    -Left knee patella brace provided -Referral for consideration of epidural steroid injection given her lumbar stenosis type pain and inability to take NSAIDs  I personally saw and evaluated the patient, and participated in the management and treatment plan.  Vanetta Mulders, MD Attending Physician, Orthopedic Surgery  This document was dictated using Dragon voice recognition software. A reasonable attempt at proof reading has been made to minimize errors.

## 2021-09-15 ENCOUNTER — Other Ambulatory Visit: Payer: Self-pay | Admitting: Medical

## 2021-09-15 DIAGNOSIS — M1A9XX0 Chronic gout, unspecified, without tophus (tophi): Secondary | ICD-10-CM

## 2021-09-15 MED ORDER — BELSOMRA 15 MG PO TABS: 1.0000 | ORAL_TABLET | Freq: Every day | ORAL | 1 refills | Status: DC

## 2021-09-15 MED ORDER — ALLOPURINOL 100 MG PO TABS: 200.0000 mg | ORAL_TABLET | Freq: Every day | ORAL | 1 refills | Status: DC

## 2021-09-15 MED ORDER — COLCHICINE 0.6 MG PO TABS: 0.6000 mg | ORAL_TABLET | Freq: Two times a day (BID) | ORAL | 1 refills | Status: DC

## 2021-09-20 ENCOUNTER — Encounter: Payer: Self-pay | Admitting: Internal Medicine

## 2021-11-12 ENCOUNTER — Other Ambulatory Visit: Payer: Self-pay | Admitting: Medical

## 2021-12-08 ENCOUNTER — Telehealth: Payer: Self-pay | Admitting: Internal Medicine

## 2021-12-08 NOTE — Telephone Encounter (Signed)
Pt was notified.  

## 2021-12-08 NOTE — Telephone Encounter (Signed)
Pt is in [place] until Jan 5th and she is having some arthitis flare up and she wants to know how much tylenol can she take and for how long can she take it for.

## 2021-12-23 ENCOUNTER — Ambulatory Visit (INDEPENDENT_AMBULATORY_CARE_PROVIDER_SITE_OTHER): Payer: Managed Care, Other (non HMO) | Admitting: Medical

## 2021-12-23 ENCOUNTER — Other Ambulatory Visit: Payer: Self-pay

## 2021-12-23 VITALS — BP 130/86 | HR 78 | Temp 97.4°F | Wt 216.6 lb

## 2021-12-23 DIAGNOSIS — M25571 Pain in right ankle and joints of right foot: Secondary | ICD-10-CM

## 2021-12-23 DIAGNOSIS — M79671 Pain in right foot: Secondary | ICD-10-CM

## 2021-12-23 DIAGNOSIS — N1832 Chronic kidney disease, stage 3b: Secondary | ICD-10-CM

## 2021-12-23 DIAGNOSIS — E162 Hypoglycemia, unspecified: Secondary | ICD-10-CM | POA: Diagnosis not present

## 2021-12-23 DIAGNOSIS — E1165 Type 2 diabetes mellitus with hyperglycemia: Secondary | ICD-10-CM

## 2021-12-23 DIAGNOSIS — E790 Hyperuricemia without signs of inflammatory arthritis and tophaceous disease: Secondary | ICD-10-CM

## 2021-12-23 DIAGNOSIS — R748 Abnormal levels of other serum enzymes: Secondary | ICD-10-CM

## 2021-12-23 MED ORDER — HYDROCODONE-ACETAMINOPHEN 5-325 MG PO TABS: 1.0000 | ORAL_TABLET | Freq: Four times a day (QID) | ORAL | 0 refills | Status: DC | PRN

## 2021-12-23 MED ORDER — METHYLPREDNISOLONE ACETATE 40 MG/ML IJ SUSP
40.0000 mg | Freq: Once | INTRAMUSCULAR | Status: AC
  Administered 2021-12-23: 40 mg via INTRAMUSCULAR

## 2021-12-23 NOTE — Progress Notes (Signed)
Subjective:  Lauren [name] is a 61 y.o. female who presents for Chief Complaint  Patient presents with   gout flare up    Right foot gout flare up.      Last visit here 09/2021.    She notes for a few weeks been having low sugars.  Having lows in the 50-66 range.  Using the freestyle libre device.   Has glucose tabs to use for emergency.  Using Toujeo 10u daily.   Likes to walk but hasn't been walking as much as she would like other than recent vacation in [place].  Diet is about the same as last visit.   Using counseling with [employer] for nutrition.   Was in [place] for 2 weeks during the time of the hypoglycemia.  Was doing more walking.   Diet hasn't really changed.   Having "gout flare".  Been having pains in right ankle and mid foot.  Taking 2 allopurinol daily.  Using tart cherry tablets and colchicine but colchicine BID gives her heavy watery stools.    Hx/o elevated ALP and LFTs. Hasn't yet seen gastro.  Was in [place] to meet her new grandchild. Her daughter is in [place] and lives in [place]  No other aggravating or relieving factors.    No other c/o.  The following portions of the patient's history were reviewed and updated as appropriate: allergies, current medications, past family history, past medical history, past social history, past surgical history and problem list.  ROS Otherwise as in subjective above  Objective: BP 130/86    Pulse 78    Temp (!) 97.4 F (36.3 C)    Wt 216 lb 9.6 oz (98.2 kg)    BMI 39.62 kg/m   General appearance: alert, no distress, well developed, well nourished Heart: RRR, normal S1, S2, no murmurs Lungs: CTA bilaterally, no wheezes, rhonchi, or rales Mild swelling of right ankle laterally, and tender over ankle and mid foot, reduced ankle ROM.  Rest of toes and leg unremarkable Pulses: 2+ radial pulses, 2+ pedal pulses, normal cap refill Ext: no edema   Assessment: Encounter Diagnoses  Name Primary?   Foot pain, right Yes   Acute  right ankle pain    Hypoglycemia    Type 2 diabetes mellitus with hyperglycemia, without long-term current use of insulin (HCC)    Stage 3b chronic kidney disease (HCC)    Elevated liver enzymes    Alkaline phosphatase elevation    Elevated uric acid in blood      Plan: Foot pain, ankle pain-we discussed possible differential causes.  Could be gout flare but could be other inflammation.  No recent injury or trauma.  Can use Tylenol or hydrocodone below for pain.  Discussed risk and benefits of medications.  Do not duplicate Tylenol.  Avoid NSAIDs.  Depo-Medrol 40 mg IM given today.  Cut back colchicine to once daily or 1/2 tablet twice daily to see if she can tolerate this and still get benefit.  She is not tolerating regular dosing due to severe diarrhea.  Hx/o gout/elevated uric acid - c/t Allopurinol 200mg  daily.   Diabetes  with recent hypoglycemia - we want to avoid hypoglycemia.   Immediately reduce Toujeo from 10u to 7 u daily.  Continue monitoring.  If still getting any sugars <80 in the next week, then call back.  Discussed emergency measures if hypoglycemia.  Discussed hypo pen for hypoglycemia.  She declines today  Elevated LFT and ALP - we will check on  status of GI referral  [facility] - return soon for med check and labs.  Avoid NSAIDs, avoid dehydration.    Lauren [name] was seen today for gout flare up.  Diagnoses and all orders for this visit:  Foot pain, right -     methylPREDNISolone acetate (DEPO-MEDROL) injection 40 mg  Acute right ankle pain  Hypoglycemia  Type 2 diabetes mellitus with hyperglycemia, without long-term current use of insulin (HCC)  Stage 3b chronic kidney disease (HCC)  Elevated liver enzymes  Alkaline phosphatase elevation  Elevated uric acid in blood  Other orders -     HYDROcodone-acetaminophen (NORCO) 5-325 MG tablet; Take 1 tablet by mouth every 6 (six) hours as needed.    Follow up: 65mo

## 2021-12-23 NOTE — Patient Instructions (Addendum)
Given the recent low sugars, back off Toujeo to 7 units daily.  Continue to monitor sugars.    Goal is 80-130 fasting.   If you start creeping up over 130 fasting, we may need to go up a little.  But for now, 10 units is obviously too much.  If you still are getting readings under 70 within the next week, call back.     For joint pain currently: Since you are having lots of diarrhea, try just using half dosing of Colchicine and see if you tolerate this Colchicine is meant to be used short term for flare up only Continue Tart Cherry tablets We gave you a shot of steroid today to help the inflammation You can use Tylenol over the counter either 325mg  or 500mg  up to 3 times daily short term If you continue to have ankle and foot pains for several more weeks, we would need to get some xrays

## 2021-12-26 ENCOUNTER — Encounter: Payer: Self-pay | Admitting: Internal Medicine

## 2022-01-09 ENCOUNTER — Other Ambulatory Visit: Payer: Self-pay

## 2022-01-09 MED ORDER — ALLOPURINOL 100 MG PO TABS: 200.0000 mg | ORAL_TABLET | Freq: Every day | ORAL | 1 refills | Status: DC

## 2022-01-11 HISTORY — PX: LIVER BIOPSY: SHX301

## 2022-01-12 ENCOUNTER — Telehealth: Payer: Self-pay | Admitting: Gastroenterology

## 2022-01-12 ENCOUNTER — Encounter: Payer: Self-pay | Admitting: Gastroenterology

## 2022-01-12 ENCOUNTER — Other Ambulatory Visit (INDEPENDENT_AMBULATORY_CARE_PROVIDER_SITE_OTHER): Payer: 59

## 2022-01-12 ENCOUNTER — Ambulatory Visit (INDEPENDENT_AMBULATORY_CARE_PROVIDER_SITE_OTHER): Payer: 59 | Admitting: Gastroenterology

## 2022-01-12 VITALS — BP 128/88 | HR 95 | Ht 61.5 in | Wt 217.0 lb

## 2022-01-12 DIAGNOSIS — R7989 Other specified abnormal findings of blood chemistry: Secondary | ICD-10-CM

## 2022-01-12 DIAGNOSIS — R748 Abnormal levels of other serum enzymes: Secondary | ICD-10-CM

## 2022-01-12 LAB — HEPATIC FUNCTION PANEL
ALT: 21 U/L (ref 0–35)
AST: 25 U/L (ref 0–37)
Albumin: 4.6 g/dL (ref 3.5–5.2)
Alkaline Phosphatase: 167 U/L — ABNORMAL HIGH (ref 39–117)
Bilirubin, Direct: 0.1 mg/dL (ref 0.0–0.3)
Total Bilirubin: 0.4 mg/dL (ref 0.2–1.2)
Total Protein: 8.1 g/dL (ref 6.0–8.3)

## 2022-01-12 LAB — PROTIME-INR
INR: 0.8 ratio (ref 0.8–1.0)
Prothrombin Time: 9.4 s — ABNORMAL LOW (ref 9.6–13.1)

## 2022-01-12 NOTE — Progress Notes (Signed)
01/12/2022 Lauren [name] [id number] 07-Jan-1961   HISTORY OF PRESENT ILLNESS:  This is a 61 year old female who is new to our office.  She has been referred here by Chana Bode, PA-C, for evaluation of elevated LFTs, primarily alk phos with a peak of 252.  AST and ALT mildly elevated at 49 and 43.  Total bili is normal.  Ultrasound showed fatty liver.  Labs have not been checked since October.  No significant alcohol use or consumption of herbs, etc. She tells me that she does use Synergy THC/CBD balm on her joints for gout, etc.  She tells me that that actually did show up on her urine drug screen test.  No family history of liver disease to her knowledge.  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  No issues with that in quite some time.  She and her husband just relocated here from [place] several months ago.  She tells me that she had a colonoscopy in 2018.  She does not recall having any polyps at that time.   Past Medical History:  Diagnosis Date   Anemia    History of thyroid cancer    Hyperlipidemia    Hypertension    Thyroid disease    Past Surgical History:  Procedure Laterality Date   GASTRIC BYPASS     KNEE SURGERY Left 2017    reports that she has quit smoking. Her smoking use included e-cigarettes. She has never used smokeless tobacco. She reports current alcohol use. She reports that she does not currently use drugs. family history includes Breast cancer in her maternal grandmother and mother; Diabetes Mellitus II in her paternal grandfather; Throat cancer in her paternal grandfather. Allergies  Allergen Reactions   Oxycodone Itching      Outpatient Encounter Medications as of 01/12/2022  Medication Sig   allopurinol (ZYLOPRIM) 100 MG tablet Take 2 tablets (200 mg total) by mouth daily.   amLODipine-valsartan (EXFORGE) 5-160 MG tablet Take 1 tablet by mouth daily.   atorvastatin (LIPITOR) 80 MG tablet Take 1  tablet (80 mg total) by mouth daily.   colchicine 0.6 MG tablet Take 1 tablet (0.6 mg total) by mouth 2 (two) times daily. Take 2 tablets at onset of gout pain.  Take an additional 1 tablet an hour later, if needed   Continuous Blood Gluc Sensor (FREESTYLE LIBRE 2 SENSOR) MISC by Does not apply route.   famotidine (PEPCID) 20 MG tablet Take 20 mg by mouth 2 (two) times daily as needed.   HYDROcodone-acetaminophen (NORCO) 5-325 MG tablet Take 1 tablet by mouth every 6 (six) hours as needed.   insulin glargine, 1 Unit Dial, (TOUJEO) 300 UNIT/ML Solostar Pen Inject 10 Units into the skin daily.   Insulin Pen Needle (BD PEN NEEDLE NANO U/F) 32G X 4 MM MISC 1 each by Does not apply route at bedtime.   LUMIGAN 0.01 % SOLN 1 drop at bedtime.   Misc Natural Products (TART CHERRY ADVANCED PO) Take 1 capsule by mouth daily.   Prenatal Vit-Fe Fumarate-FA (PRENATAL VITAMIN) 27-0.8 MG TABS Take 1 tablet by mouth daily.   Suvorexant (BELSOMRA) 15 MG TABS Take 1 tablet by mouth daily. (Patient taking differently: Take 1 tablet by mouth as needed.)   No facility-administered encounter medications on file as of 01/12/2022.     REVIEW OF SYSTEMS  : All other systems reviewed and negative except where noted in the History of  Present Illness.   PHYSICAL EXAM: BP 128/88    Pulse 95    Ht 5' 1.5" (1.562 m)    Wt 217 lb (98.4 kg)    SpO2 97%    BMI 40.34 kg/m  General: Well developed AA female in no acute distress Head: Normocephalic and atraumatic Eyes:  Sclerae anicteric, conjunctiva pink. Ears: Normal auditory acuity Lungs: Clear throughout to auscultation; no W/R/R. Heart: Regular rate and rhythm; no M/R/G. Abdomen: Soft, non-distended.  BS present.  Non-tender. Musculoskeletal: Symmetrical with no gross deformities  Skin: No lesions on visible extremities Extremities: No edema  Neurological: Alert oriented x 4, grossly non-focal Psychological:  Alert and cooperative. Normal mood and affect  ASSESSMENT  AND PLAN: *Elevated LFTs, primarily alk phos with a peak of 252.  AST and ALT mildly elevated at 49 and 43.  Total bili is normal.  Ultrasound showed fatty liver.  Labs have not been checked since October.  No alcohol use or consumption of herbs, etc.  No family history of liver disease to her knowledge.  We will recheck LFTs and perform extensive serologic evaluation to rule out other causes of chronic liver disease.  **We will try to request her previous colonoscopy records from [place].   CC:  Tysinger, Camelia Eng, PA-C

## 2022-01-12 NOTE — Patient Instructions (Signed)
Your provider has requested that you go to the basement level for lab work before leaving today. Press "B" on the elevator. The lab is located at the first door on the left as you exit the elevator.  If you are age 61 or older, your body mass index should be between 23-30. Your Body mass index is 40.34 kg/m. If this is out of the aforementioned range listed, please consider follow up with your Primary Care Provider.  If you are age 13 or younger, your body mass index should be between 19-25. Your Body mass index is 40.34 kg/m. If this is out of the aformentioned range listed, please consider follow up with your Primary Care Provider.   ________________________________________________________  The [place] [facility] providers would like to encourage you to use [facility] to communicate with providers for non-urgent requests or questions.  Due to long hold times on the telephone, sending your provider a message by [facility] may be a faster and more efficient way to get a response.  Please allow 48 business hours for a response.  Please remember that this is for non-urgent requests.  _______________________________________________________

## 2022-01-12 NOTE — Telephone Encounter (Signed)
Inbound call from patient states last colonoscopy was in 2012 by Dr. Ritta Slot Phone number [phone]

## 2022-01-13 NOTE — Progress Notes (Signed)
Agree with assessment and plan as outlined.  

## 2022-01-13 NOTE — Telephone Encounter (Signed)
Spoke with patient and she will come by and sign a records release.

## 2022-01-17 LAB — ALPHA-1-ANTITRYPSIN: A-1 Antitrypsin, Ser: 126 mg/dL (ref 83–199)

## 2022-01-17 LAB — MITOCHONDRIAL ANTIBODIES: Mitochondrial M2 Ab, IgG: 50.1 U — ABNORMAL HIGH (ref <=20.0)

## 2022-01-17 LAB — ANA: Anti Nuclear Antibody (ANA): NEGATIVE

## 2022-01-17 LAB — ANTI-SMOOTH MUSCLE ANTIBODY, IGG: Actin (Smooth Muscle) Antibody (IGG): <20 U (ref <20)

## 2022-01-17 LAB — ALKALINE PHOSPHATASE ISOENZYMES
Alkaline phosphatase (APISO): 170 U/L — ABNORMAL HIGH (ref 37–153)
Bone Isoenzymes: 19 % — ABNORMAL LOW (ref 28–66)
Intestinal Isoenzymes: 0 % — ABNORMAL LOW (ref 1–24)
Liver Isoenzymes: 81 % — ABNORMAL HIGH (ref 25–69)
Macrohepatic isoenzymes: 0 % (ref <=0)
Placental isoenzymes: 0 % (ref <=0)

## 2022-01-17 LAB — IGG: IgG (Immunoglobin G), Serum: 1003 mg/dL (ref 600–1640)

## 2022-01-17 LAB — CERULOPLASMIN: Ceruloplasmin: 29 mg/dL (ref 18–53)

## 2022-01-20 ENCOUNTER — Encounter: Payer: Self-pay | Admitting: Pulmonary Disease

## 2022-01-20 ENCOUNTER — Other Ambulatory Visit: Payer: Self-pay

## 2022-01-20 ENCOUNTER — Ambulatory Visit (INDEPENDENT_AMBULATORY_CARE_PROVIDER_SITE_OTHER): Payer: 59 | Admitting: Pulmonary Disease

## 2022-01-20 VITALS — BP 138/100 | HR 98 | Temp 98.0°F | Ht 62.0 in | Wt 217.8 lb

## 2022-01-20 DIAGNOSIS — G4733 Obstructive sleep apnea (adult) (pediatric): Secondary | ICD-10-CM

## 2022-01-20 DIAGNOSIS — G4722 Circadian rhythm sleep disorder, advanced sleep phase type: Secondary | ICD-10-CM

## 2022-01-20 NOTE — Patient Instructions (Signed)
Will call with results of CPAP download  Follow up in 1 year 

## 2022-01-20 NOTE — Progress Notes (Signed)
[facility]  Chief Complaint  Patient presents with   Consult    Sleep consult DME: [facility]    Past Surgical History:  She  has a past surgical history that includes Gastric bypass and Knee surgery (Left, 2017).  Past Medical History:  Anemia, Thyroid cancer, HLD, HTN, Gout, DM type 2, Reflux, Sarcoidosis  Constitutional:  BP (!) 138/100 (BP Location: Left Arm, Cuff Size: Normal)    Pulse 98    Temp 98 F (36.7 C) (Oral)    Ht 5\' 2"  (1.575 m)    Wt 217 lb 12.8 oz (98.8 kg)    SpO2 99%    BMI 39.84 kg/m   Brief Summary:  Lauren [name] is a 61 y.o. female with obstructive sleep apnea.      Subjective:   She is here with her husband.  She recently moved from [place] to [place].    She had a sleep study in 2009 and started on CPAP.  Her current machine is at 10 cm H2O and she uses nasal mask.  Her machine is about 61 years old.  She gets supplies from [employer].  No issues with mask fit or pressure setting.  She had gastric bypass and lost about 70 lbs.  She still has snoring and apnea if she doesn't use CPAP.  She goes to bed at 830 pm.  She falls asleep easily.  She wakes up at 230 am.  She then can't fall back to sleep.  She would prefer to wake up at 4 am.  She has tried [place], but this didn't help her fall back to sleep.  She has more recently tried belsomra, but this hasn't helped her fall back to sleep either.  She uses this sporadically.  She was getting episodes of sleep paralysis when she was heavier and had more difficulty with sleep apnea.  She denies sleep walking, sleep talking, bruxism, or nightmares.  There is no history of restless legs.  She denies sleep hallucinations, or cataplexy.  The Epworth score is 2 out of 24.   Physical Exam:   Appearance - well kempt   ENMT - no sinus tenderness, no oral exudate, no LAN, Mallampati 3 airway, no stridor  Respiratory - equal breath sounds bilaterally, no wheezing or  rales  CV - s1s2 regular rate and rhythm, no murmurs  Ext - no clubbing, no edema  Skin - no rashes  Psych - normal mood and affect   Sleep Tests:  PSG 11/26/08 >> AHI 19.4, SpO2 low 80%  Social History:  She  reports that she quit smoking about 21 years ago. Her smoking use included e-cigarettes and cigarettes. She has a 27.50 pack-year smoking history. She has never used smokeless tobacco. She reports current alcohol use. She reports that she does not currently use drugs.  Family History:  Her family history includes Breast cancer in her maternal grandmother and mother; Diabetes Mellitus II in her paternal grandfather; Throat cancer in her paternal grandfather.     Assessment/Plan:   Obstructive sleep apnea. - she is compliant with CPAP and reports benefit from therapy - she uses [employer] for her DME - continue CPAP 10 cm H2O - will get a copy of her download and call her with results  Advanced sleep phase. - explained how this could mimic symptoms of sleep maintenance insomnia - advised her to delay her sleep time until 930 pm and wake time at 4 am - emphasized the  importance of maintaining a regular sleep-wake schedule - proper sleep hygiene reviewed  Time Spent Involved in Patient Care on Day of Examination:  37 minutes  Follow up:   Patient Instructions  Will call with results of CPAP download  Follow up in 1 year  Medication List:   Allergies as of 01/20/2022       Reactions   Oxycodone Itching        Medication List        Accurate as of January 20, 2022  3:58 PM. If you have any questions, ask your nurse or doctor.          allopurinol 100 MG tablet Commonly known as: ZYLOPRIM Take 2 tablets (200 mg total) by mouth daily.   amLODipine-valsartan 5-160 MG tablet Commonly known as: EXFORGE Take 1 tablet by mouth daily.   atorvastatin 80 MG tablet Commonly known as: LIPITOR Take 1 tablet (80 mg total) by mouth daily.   BD Pen Needle Nano  U/F 32G X 4 MM Misc Generic drug: Insulin Pen Needle 1 each by Does not apply route at bedtime.   Belsomra 15 MG Tabs Generic drug: Suvorexant Take 1 tablet by mouth daily. What changed:  when to take this reasons to take this   colchicine 0.6 MG tablet Take 1 tablet (0.6 mg total) by mouth 2 (two) times daily. Take 2 tablets at onset of gout pain.  Take an additional 1 tablet an hour later, if needed   famotidine 20 MG tablet Commonly known as: PEPCID Take 20 mg by mouth 2 (two) times daily as needed.   FreeStyle Libre 2 Sensor Misc by Does not apply route.   HYDROcodone-acetaminophen 5-325 MG tablet Commonly known as: Norco Take 1 tablet by mouth every 6 (six) hours as needed.   insulin glargine (1 Unit Dial) 300 UNIT/ML Solostar Pen Commonly known as: TOUJEO Inject 10 Units into the skin daily.   Lumigan 0.01 % Soln Generic drug: bimatoprost 1 drop at bedtime.   Prenatal Vitamin 27-0.8 MG Tabs Take 1 tablet by mouth daily.   TART CHERRY ADVANCED PO Take 1 capsule by mouth daily.        Signature:  Chesley Mires, MD [place] Pager - [phone] 01/20/2022, 3:58 PM

## 2022-01-24 ENCOUNTER — Ambulatory Visit (INDEPENDENT_AMBULATORY_CARE_PROVIDER_SITE_OTHER): Payer: 59 | Admitting: Gastroenterology

## 2022-01-24 ENCOUNTER — Other Ambulatory Visit: Payer: Self-pay

## 2022-01-24 ENCOUNTER — Ambulatory Visit (INDEPENDENT_AMBULATORY_CARE_PROVIDER_SITE_OTHER)
Admission: RE | Admit: 2022-01-24 | Discharge: 2022-01-24 | Disposition: A | Discharge status: Home / Self Care | Payer: 59 | Source: Ambulatory Visit | Attending: Gastroenterology | Admitting: Gastroenterology

## 2022-01-24 ENCOUNTER — Encounter: Payer: Self-pay | Admitting: Gastroenterology

## 2022-01-24 ENCOUNTER — Other Ambulatory Visit (INDEPENDENT_AMBULATORY_CARE_PROVIDER_SITE_OTHER): Payer: 59

## 2022-01-24 VITALS — BP 140/90 | HR 70 | Ht 62.0 in | Wt 218.0 lb

## 2022-01-24 DIAGNOSIS — R748 Abnormal levels of other serum enzymes: Secondary | ICD-10-CM

## 2022-01-24 DIAGNOSIS — K760 Fatty (change of) liver, not elsewhere classified: Secondary | ICD-10-CM

## 2022-01-24 DIAGNOSIS — Z1211 Encounter for screening for malignant neoplasm of colon: Secondary | ICD-10-CM | POA: Diagnosis not present

## 2022-01-24 DIAGNOSIS — K743 Primary biliary cirrhosis: Secondary | ICD-10-CM

## 2022-01-24 LAB — TSH: TSH: 4.46 u[IU]/mL (ref 0.35–5.50)

## 2022-01-24 MED ORDER — SUTAB 1479-225-188 MG PO TABS: 1.0000 | ORAL_TABLET | Freq: Once | ORAL | 0 refills | Status: AC

## 2022-01-24 NOTE — Patient Instructions (Addendum)
If you are age 61 or older, your body mass index should be between 23-30. Your Body mass index is 39.87 kg/m. If this is out of the aforementioned range listed, please consider follow up with your Primary Care Provider.  If you are age 35 or younger, your body mass index should be between 19-25. Your Body mass index is 39.87 kg/m. If this is out of the aformentioned range listed, please consider follow up with your Primary Care Provider.   ________________________________________________________  The  GI providers would like to encourage you to use [facility] to communicate with providers for non-urgent requests or questions.  Due to long hold times on the telephone, sending your provider a message by [facility] may be a faster and more efficient way to get a response.  Please allow 48 business hours for a response.  Please remember that this is for non-urgent requests.  _______________________________________________________  Please go to the lab in the basement of our building to have lab work done as you leave today. Hit "B" for basement when you get on the elevator.  When the doors open the lab is on your left.  We will call you with the results. Thank you.  Please go to the lab in the basement of our building to have Bone Density scan done as you leave today. Hit "B" for basement when you get on the elevator.  When the doors open Radiology is straight ahead.   You will be contacted to schedule a LIVER BIOPSY at [facility] have been scheduled for a Colonoscopy on 03-07-22. Please follow written instructions given to you at your visit today.  Please pick up your prep supplies at the pharmacy within the next 1-3 days. If you use inhalers (even only as needed), please bring them with you on the day of your procedure.  Please follow up in 3 to 4 months.  Thank you for entrusting me with your care and for choosing [facility], Dr. Melvina Cellar

## 2022-01-24 NOTE — Progress Notes (Signed)
HPI :  61 year old female here for a follow-up visit for abnormal liver enzymes.  Recall she was seen just a few weeks ago to establish care with [place], seen by Alonza Bogus.  As far as our system looks back, her liver enzymes have been elevated since August 22.  [place] of state and moved to [place] within the past year.  She thinks she has been told she has had elevated liver enzymes for 5 years or so.  Alk phos elevated from 160s to 250s, AST anywhere from 20s to 40s, ALT 220s to 40s.  Bilirubin normal.  She underwent a right upper quadrant ultrasound in August of last year which showed fatty liver.  She has undergone a serologic work-up for this so far to include negative testing for hepatitis B and C, normal iron studies, negative testing for autoimmune hepatitis, normal ceruloplasmin, normal alpha-1 antitrypsin.  Her antimitochondrial antibody however was markedly positive at 50.  Does have occasional pruritus, does have occasional dry eyes and dry mouth for which she uses eyedrops.  She does have some fatigue at the end of the day but attributes this to insomnia as she does really not sleep very well.  She does admit to a history of sarcoidosis remotely but has not been an issue for her more recently.  She has very occasional alcohol use, not routinely.  She denies any family history of liver disease.  She previously weighed 271 pounds, had a gastric sleeve 10 years ago, has overtime been able to reduce her weight down to 218, her BMI remains around 39-40.  History of thyroid cancer status post partial thyroidectomy.  She is active and tries to exercise routinely but has been limited by gout in that regard and has been challenging to lose weight.  She had a colonoscopy in 2012 which she thinks was normal, have not been able to obtain results but she is overdue for screening.  Denies any problems with her bowels at this time.  No issues with anesthesia in the past that she  recalls.    Colonoscopy 2012 - Dr. Sheela Stack - no report available   RUQ [place] 08/01/21: IMPRESSION: 1. Heterogeneous increased hepatic parenchymal echogenicity typical of hepatic steatosis. 2. Gallstones without sonographic findings of acute cholecystitis. No biliary dilatation.   AP elevated 167 on 01/12/22 - elevations to 230s in the past ALT 21, AST 25,  AMA positive to 50 (strongly positive)    Past Medical History:  Diagnosis Date   Anemia    History of thyroid cancer    Hyperlipidemia    Hypertension    Thyroid disease      Past Surgical History:  Procedure Laterality Date   GASTRIC BYPASS     KNEE SURGERY Left 2017   Family History  Problem Relation Age of Onset   Breast cancer Mother    Breast cancer Maternal Grandmother    Diabetes Mellitus II Paternal Grandfather    Throat cancer Paternal Grandfather    Stomach cancer Neg Hx    Esophageal cancer Neg Hx    Pancreatic cancer Neg Hx    Colon cancer Neg Hx    Social History   Tobacco Use   Smoking status: Former    Packs/day: 1.25    Years: 22.00    Pack years: 27.50    Types: E-cigarettes, Cigarettes    Quit date: 2002    Years since quitting: 21.1   Smokeless tobacco: Never   Tobacco comments:  Stopped smoking 16yr ago  Vaping Use   Vaping Use: Never used  Substance Use Topics   Alcohol use: Yes    Comment: Occ.   Drug use: Not Currently    Comment: gummies   Current Outpatient Medications  Medication Sig Dispense Refill   allopurinol (ZYLOPRIM) 100 MG tablet Take 2 tablets (200 mg total) by mouth daily. 90 tablet 1   amLODipine-valsartan (EXFORGE) 5-160 MG tablet Take 1 tablet by mouth daily.     atorvastatin (LIPITOR) 80 MG tablet Take 1 tablet (80 mg total) by mouth daily. 90 tablet 1   colchicine 0.6 MG tablet Take 1 tablet (0.6 mg total) by mouth 2 (two) times daily. Take 2 tablets at onset of gout pain.  Take an additional 1 tablet an hour later, if needed 60 tablet 1   Continuous  Blood Gluc Sensor (FREESTYLE LIBRE 2 SENSOR) MISC by Does not apply route.     famotidine (PEPCID) 20 MG tablet Take 20 mg by mouth 2 (two) times daily as needed.     HYDROcodone-acetaminophen (NORCO) 5-325 MG tablet Take 1 tablet by mouth every 6 (six) hours as needed. 12 tablet 0   insulin glargine, 1 Unit Dial, (TOUJEO) 300 UNIT/ML Solostar Pen Inject 10 Units into the skin daily. 15 mL 1   Insulin Pen Needle (BD PEN NEEDLE NANO U/F) 32G X 4 MM MISC 1 each by Does not apply route at bedtime. 100 each 2   LUMIGAN 0.01 % SOLN 1 drop at bedtime.     Misc Natural Products (TART CHERRY ADVANCED PO) Take 1 capsule by mouth daily.     Prenatal Vit-Fe Fumarate-FA (PRENATAL VITAMIN) 27-0.8 MG TABS Take 1 tablet by mouth daily. 90 tablet 3   Suvorexant (BELSOMRA) 15 MG TABS Take 1 tablet by mouth daily. (Patient taking differently: Take 1 tablet by mouth as needed.) 30 tablet 1   No current facility-administered medications for this visit.   Allergies  Allergen Reactions   Oxycodone Itching     Review of Systems: All systems reviewed and negative except where noted in HPI.   Lab Results  Component Value Date   WBC 8.9 09/12/2021   HGB 11.7 09/12/2021   HCT 36.5 09/12/2021   MCV 82 09/12/2021   PLT 427 09/12/2021    Lab Results  Component Value Date   ALT 21 01/12/2022   AST 25 01/12/2022   ALKPHOS 167 (H) 01/12/2022   BILITOT 0.4 01/12/2022    Lab Results  Component Value Date   CREATININE 1.47 (H) 09/12/2021   BUN 17 09/12/2021   NA 141 09/12/2021   K 3.9 09/12/2021   CL 104 09/12/2021   CO2 23 09/12/2021     Physical Exam: BP 140/90    Pulse 70    Ht _0  (1.575 m)    Wt 218 lb (98.9 kg)    BMI 39.87 kg/m  Constitutional: Pleasant,well-developed, female in no acute distress. Neurological: Alert and oriented to person place and time. Psychiatric: Normal mood and affect. Behavior is normal.   ASSESSMENT AND PLAN: 61year old female here for reassessment of the  following:  PBC Fatty liver Elevated liver enzymes Colon cancer screening  Reviewed her history and work-up to date.  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  We discussed what this is, risks for fibrotic change/cirrhosis/portal hypertension.  While I think PBC is the correct diagnosis,  we discussed the utility of performing a liver biopsy in the situation to confirm she has this but more so to assess for fibrotic change as this appears to have been ongoing for some time, also in the setting of fatty liver.  She does not have any overt cirrhosis on imaging or labs which is reassuring.  We discussed risks and benefits of liver biopsy, I think it would be useful again to assess for amount of fibrotic change and after this discussion she wants to proceed with it.  Will refer to IR for transcutaneous liver biopsy.  We otherwise discussed PBC, unclear if it is causing some of the symptoms that she is endorsing above.  We will check TSH, screen her for immunity to hepatitis a and B and vaccinate to those if needed.  We will also coordinate a DEXA scan to screen for osteoporosis which can commonly be seen with PBC.  We otherwise discussed fatty liver.  Would recommend minimizing alcohol use.  She should continue to work on weight loss, discussed risks of fibrosis and cirrhosis with this as well.  Assuming liver biopsy confirms PBC we will plan on starting ursodiol at that time.  We discussed that briefly and will await liver biopsy result first.  She agrees I would like to see her in 3 to 4 months for reassessment. Otherwise due for screening colonoscopy and she wants to proceed.  Plan: - refer to IR for liver biopsy - lab for TSH, immunity to hep A and B - schedule DEXA scan - anticipate starting Ursodiol when liver biopsy returnes - schedule screening colonoscopy - f/u 3-4 months  Jolly Mango,  MD [facility]  CC: Carlena Hurl, PA-C

## 2022-01-25 LAB — HEPATITIS B SURFACE ANTIBODY,QUALITATIVE: Hep B S Ab: NONREACTIVE

## 2022-01-25 LAB — HEPATITIS A ANTIBODY, TOTAL: Hepatitis A AB,Total: REACTIVE — AB

## 2022-01-26 ENCOUNTER — Other Ambulatory Visit: Payer: Self-pay

## 2022-01-26 DIAGNOSIS — K743 Primary biliary cirrhosis: Secondary | ICD-10-CM

## 2022-01-26 DIAGNOSIS — Z23 Encounter for immunization: Secondary | ICD-10-CM

## 2022-01-26 DIAGNOSIS — R748 Abnormal levels of other serum enzymes: Secondary | ICD-10-CM

## 2022-01-26 DIAGNOSIS — K760 Fatty (change of) liver, not elsewhere classified: Secondary | ICD-10-CM

## 2022-01-27 ENCOUNTER — Ambulatory Visit (INDEPENDENT_AMBULATORY_CARE_PROVIDER_SITE_OTHER): Payer: 59 | Admitting: Gastroenterology

## 2022-01-27 DIAGNOSIS — K760 Fatty (change of) liver, not elsewhere classified: Secondary | ICD-10-CM | POA: Diagnosis not present

## 2022-01-27 DIAGNOSIS — K743 Primary biliary cirrhosis: Secondary | ICD-10-CM | POA: Diagnosis not present

## 2022-01-27 DIAGNOSIS — Z23 Encounter for immunization: Secondary | ICD-10-CM | POA: Diagnosis not present

## 2022-01-27 DIAGNOSIS — R748 Abnormal levels of other serum enzymes: Secondary | ICD-10-CM | POA: Diagnosis not present

## 2022-02-01 ENCOUNTER — Other Ambulatory Visit: Payer: Self-pay | Admitting: Medical

## 2022-02-02 ENCOUNTER — Telehealth: Payer: Self-pay | Admitting: Pulmonary Disease

## 2022-02-02 ENCOUNTER — Other Ambulatory Visit: Payer: Self-pay | Admitting: Student

## 2022-02-02 NOTE — Telephone Encounter (Signed)
CPAP 12/25/21 to 01/23/22 >> used on 8 of 30 nights with average 5 hrs 45 min.  Average AHI 0.2 with CPAP 10 cm H2O.  Please let her know that her CPAP report shows good control of sleep apnea when she uses the device.  She needs to use whenever she is asleep to get maximum benefit from therapy.  If she feels the pressure setting is too high, then okay to send order to [facility] to change setting to CPAP 8 cm H2O.

## 2022-02-02 NOTE — Telephone Encounter (Signed)
ATC patient.  LMTCB. 

## 2022-02-03 ENCOUNTER — Encounter (HOSPITAL_COMMUNITY): Payer: Self-pay

## 2022-02-03 ENCOUNTER — Other Ambulatory Visit: Payer: Self-pay

## 2022-02-03 ENCOUNTER — Ambulatory Visit (HOSPITAL_COMMUNITY)
Admission: RE | Admit: 2022-02-03 | Discharge: 2022-02-03 | Disposition: A | Discharge status: Home / Self Care | Payer: Managed Care, Other (non HMO) | Source: Ambulatory Visit | Attending: Gastroenterology | Admitting: Gastroenterology

## 2022-02-03 ENCOUNTER — Inpatient Hospital Stay (HOSPITAL_COMMUNITY)
Admission: RE | Admit: 2022-02-03 | Discharge: 2022-02-03 | Disposition: A | Discharge status: Home / Self Care | Payer: Managed Care, Other (non HMO) | Source: Ambulatory Visit | Attending: Gastroenterology | Admitting: Gastroenterology

## 2022-02-03 DIAGNOSIS — E785 Hyperlipidemia, unspecified: Secondary | ICD-10-CM | POA: Insufficient documentation

## 2022-02-03 DIAGNOSIS — R7989 Other specified abnormal findings of blood chemistry: Secondary | ICD-10-CM | POA: Insufficient documentation

## 2022-02-03 DIAGNOSIS — D649 Anemia, unspecified: Secondary | ICD-10-CM | POA: Insufficient documentation

## 2022-02-03 DIAGNOSIS — Z85850 Personal history of malignant neoplasm of thyroid: Secondary | ICD-10-CM | POA: Insufficient documentation

## 2022-02-03 DIAGNOSIS — Z9884 Bariatric surgery status: Secondary | ICD-10-CM | POA: Insufficient documentation

## 2022-02-03 DIAGNOSIS — R0602 Shortness of breath: Secondary | ICD-10-CM | POA: Diagnosis not present

## 2022-02-03 DIAGNOSIS — N289 Disorder of kidney and ureter, unspecified: Secondary | ICD-10-CM | POA: Insufficient documentation

## 2022-02-03 DIAGNOSIS — K743 Primary biliary cirrhosis: Secondary | ICD-10-CM

## 2022-02-03 DIAGNOSIS — Z6839 Body mass index (BMI) 39.0-39.9, adult: Secondary | ICD-10-CM | POA: Insufficient documentation

## 2022-02-03 DIAGNOSIS — G4733 Obstructive sleep apnea (adult) (pediatric): Secondary | ICD-10-CM | POA: Insufficient documentation

## 2022-02-03 DIAGNOSIS — K760 Fatty (change of) liver, not elsewhere classified: Secondary | ICD-10-CM

## 2022-02-03 DIAGNOSIS — I10 Essential (primary) hypertension: Secondary | ICD-10-CM | POA: Insufficient documentation

## 2022-02-03 DIAGNOSIS — Z87891 Personal history of nicotine dependence: Secondary | ICD-10-CM | POA: Insufficient documentation

## 2022-02-03 DIAGNOSIS — E039 Hypothyroidism, unspecified: Secondary | ICD-10-CM | POA: Insufficient documentation

## 2022-02-03 DIAGNOSIS — K8020 Calculus of gallbladder without cholecystitis without obstruction: Secondary | ICD-10-CM | POA: Insufficient documentation

## 2022-02-03 DIAGNOSIS — E119 Type 2 diabetes mellitus without complications: Secondary | ICD-10-CM | POA: Insufficient documentation

## 2022-02-03 DIAGNOSIS — E669 Obesity, unspecified: Secondary | ICD-10-CM | POA: Insufficient documentation

## 2022-02-03 DIAGNOSIS — Z1211 Encounter for screening for malignant neoplasm of colon: Secondary | ICD-10-CM

## 2022-02-03 DIAGNOSIS — K91870 Postprocedural hematoma of a digestive system organ or structure following a digestive system procedure: Secondary | ICD-10-CM | POA: Diagnosis not present

## 2022-02-03 DIAGNOSIS — R748 Abnormal levels of other serum enzymes: Secondary | ICD-10-CM

## 2022-02-03 HISTORY — DX: Other complications of anesthesia, initial encounter: T88.59XA

## 2022-02-03 HISTORY — DX: Nausea with vomiting, unspecified: R11.2

## 2022-02-03 HISTORY — DX: Nausea with vomiting, unspecified: Z98.890

## 2022-02-03 LAB — CBC
HCT: 38.6 % (ref 36.0–46.0)
Hemoglobin: 11.9 g/dL — ABNORMAL LOW (ref 12.0–15.0)
MCH: 26.8 pg (ref 26.0–34.0)
MCHC: 30.8 g/dL (ref 30.0–36.0)
MCV: 86.9 fL (ref 80.0–100.0)
Platelets: 394 10*3/uL (ref 150–400)
RBC: 4.44 MIL/uL (ref 3.87–5.11)
RDW: 15.9 % — ABNORMAL HIGH (ref 11.5–15.5)
WBC: 6.8 10*3/uL (ref 4.0–10.5)
nRBC: 0 % (ref 0.0–0.2)

## 2022-02-03 LAB — PROTIME-INR
INR: 0.9 (ref 0.8–1.2)
Prothrombin Time: 11.6 seconds (ref 11.4–15.2)

## 2022-02-03 LAB — GLUCOSE, CAPILLARY: Glucose-Capillary: 101 mg/dL — ABNORMAL HIGH (ref 70–99)

## 2022-02-03 MED ORDER — FENTANYL CITRATE (PF) 100 MCG/2ML IJ SOLN
INTRAMUSCULAR | Status: AC
  Filled 2022-02-03: qty 2

## 2022-02-03 MED ORDER — LIDOCAINE HCL 1 % IJ SOLN
INTRAMUSCULAR | Status: AC
  Administered 2022-02-03: 10 mL
  Filled 2022-02-03: qty 20

## 2022-02-03 MED ORDER — FENTANYL CITRATE (PF) 100 MCG/2ML IJ SOLN
INTRAMUSCULAR | Status: AC | PRN
  Administered 2022-02-03 (×2): 50 ug via INTRAVENOUS

## 2022-02-03 MED ORDER — SODIUM CHLORIDE 0.9 % IV SOLN: INTRAVENOUS | Status: DC

## 2022-02-03 MED ORDER — MIDAZOLAM HCL 2 MG/2ML IJ SOLN
INTRAMUSCULAR | Status: AC
  Filled 2022-02-03: qty 4

## 2022-02-03 MED ORDER — GELATIN ABSORBABLE 12-7 MM EX MISC
CUTANEOUS | Status: AC
  Filled 2022-02-03: qty 1

## 2022-02-03 MED ORDER — MIDAZOLAM HCL 2 MG/2ML IJ SOLN
INTRAMUSCULAR | Status: AC | PRN
  Administered 2022-02-03 (×2): 1 mg via INTRAVENOUS

## 2022-02-03 NOTE — Discharge Instructions (Addendum)
There are no changes to your home medications      Interventional radiology phone numbers [phone] After hours [phone]  Liver Biopsy, Care After These instructions give you information on caring for yourself after your procedure. Your doctor may also give you more specific instructions. Call your doctor if you have any problems or questions after your procedure. What can I expect after the procedure? After the procedure, it is common to have: Pain and soreness where the biopsy was done. Bruising around the area where the biopsy was done. Sleepiness and be tired for a few days. Follow these instructions at home: Medicines Take over-the-counter and prescription medicines only as told by your doctor. If you were prescribed an antibiotic medicine, take it as told by your doctor. Do not stop taking the antibiotic even if you start to feel better. Do not take medicines such as aspirin and ibuprofen. These medicines can thin your blood. Do not take these medicines unless your doctor tells you to take them. If you are taking prescription pain medicine, take actions to prevent or treat constipation. Your doctor may recommend that you: Drink enough fluid to keep your pee (urine) clear or pale yellow. Take over-the-counter or prescription medicines. Eat foods that are high in fiber, such as fresh fruits and vegetables, whole grains, and beans. Limit foods that are high in fat and processed sugars, such as fried and sweet foods. Caring for your cut Follow instructions from your doctor about how to take care of your cuts from surgery (incisions). Make sure you: Wash your hands with soap and water before you change your bandage (dressing). If you cannot use soap and water, use hand sanitizer. Change your bandage as told by your doctor. Check your cuts every day for signs of infection. Check for: Redness, swelling, or more pain. Fluid or blood. Pus or a bad smell. Warmth. Do not take  baths, swim, or use a hot tub until your doctor says it is okay to do so. You may remove your dressing tomorrow and shower. Activity Rest at home for 1-2 days or as told by your doctor. Avoid sitting for a long time without moving. Get up to take short walks every 1-2 hours. Return to your normal activities as told by your doctor. Ask what activities are safe for you. Do not do these things in the first 24 hours: Drive. Use machinery. Take a bath or shower. Do not lift more than 10 pounds (4.5 kg) or play contact sports for the first 2 weeks.   General instructions Do not drink alcohol in the first week after the procedure. Have someone stay with you for at least 24 hours after the procedure. Get your test results. Ask your doctor or the department that is doing the test: When will my results be ready? How will I get my results? What are my treatment options? What other tests do I need? What are my next steps? Keep all follow-up visits as told by your doctor. This is important.   Contact a doctor if: A cut bleeds and leaves more than just a small spot of blood. A cut is red, puffs up (swells), or hurts more than before. Fluid or something else comes from a cut. A cut smells bad. You have a fever or chills. Get help right away if: You have swelling, bloating, or pain in your belly (abdomen). You get dizzy or faint. You have a rash. You feel sick to your stomach (nauseous) or throw up (vomit).  You have trouble breathing, feel short of breath, or feel faint. Your chest hurts. You have problems talking or seeing. You have trouble with your balance or moving your arms or legs. Summary After the procedure, it is common to have pain, soreness, bruising, and tiredness. Your doctor will tell you how to take care of yourself at home. Change your bandage, take your medicines, and limit your activities as told by your doctor. Call your doctor if you have symptoms of infection. Get help  right away if your belly swells, your cut bleeds a lot, or you have trouble talking or breathing. This information is not intended to replace advice given to you by your health care provider. Make sure you discuss any questions you have with your health care provider. Document Revised: 12/06/2017 Document Reviewed: 12/07/2017 Elsevier Patient Education  2021 [place].     Moderate Conscious Sedation, Adult, Care After This sheet gives you information about how to care for yourself after your procedure. Your health care provider may also give you more specific instructions. If you have problems or questions, contact your health care provider. What can I expect after the procedure? After the procedure, it is common to have: Sleepiness for several hours. Impaired judgment for several hours. Difficulty with balance. Vomiting if you eat too soon. Follow these instructions at home: For the time period you were told by your health care provider: Rest. Do not participate in activities where you could fall or become injured. Do not drive or use machinery. Do not drink alcohol. Do not take sleeping pills or medicines that cause drowsiness. Do not make important decisions or sign legal documents. Do not take care of children on your own.     Eating and drinking Follow the diet recommended by your health care provider. Drink enough fluid to keep your urine pale yellow. If you vomit: Drink water, juice, or soup when you can drink without vomiting. Make sure you have little or no nausea before eating solid foods.   General instructions Take over-the-counter and prescription medicines only as told by your health care provider. Have a responsible adult stay with you for the time you are told. It is important to have someone help care for you until you are awake and alert. Do not smoke. Keep all follow-up visits as told by your health care provider. This is important. Contact a health care  provider if: You are still sleepy or having trouble with balance after 24 hours. You feel light-headed. You keep feeling nauseous or you keep vomiting. You develop a rash. You have a fever. You have redness or swelling around the IV site. Get help right away if: You have trouble breathing. You have new-onset confusion at home. Summary After the procedure, it is common to feel sleepy, have impaired judgment, or feel nauseous if you eat too soon. Rest after you get home. Know the things you should not do after the procedure. Follow the diet recommended by your health care provider and drink enough fluid to keep your urine pale yellow. Get help right away if you have trouble breathing or new-onset confusion at home. This information is not intended to replace advice given to you by your health care provider. Make sure you discuss any questions you have with your health care provider. Document Revised: 03/26/2020 Document Reviewed: 10/23/2019 Elsevier Patient Education  2021 [employer].

## 2022-02-03 NOTE — Consult Note (Addendum)
Chief Complaint: Patient was seen in consultation today for image guided random liver biopsy  Referring Physician(s): Armbruster,Steven P  Supervising Physician: Arne Cleveland  Patient Status: [facility] - Out-pt  History of Present Illness: Lauren [name] is a 61 y.o. female ex smoker with past medical history of anemia, thyroid cancer, hypothyroidism, obstructive sleep apnea, hyperlipidemia, obesity with prior gastric bypass, DM ,renal insufficiency and hypertension who presents now with elevated liver function tests, fatty liver by imaging, positive AMA and elevated alkaline phosphatase.  She presents today for image guided random core liver biopsy to rule out suspected PBC.  Past Medical History:  Diagnosis Date   Anemia    History of thyroid cancer    Hyperlipidemia    Hypertension    Thyroid disease     Past Surgical History:  Procedure Laterality Date   GASTRIC BYPASS     KNEE SURGERY Left 2017    Allergies: Oxycodone  Medications: Prior to Admission medications   Medication Sig Start Date End Date Taking? Authorizing Provider  allopurinol (ZYLOPRIM) 100 MG tablet Take 2 tablets (200 mg total) by mouth daily. 01/09/22   Tysinger, Camelia Eng, PA-C  amLODipine-valsartan (EXFORGE) 5-160 MG tablet Take 1 tablet by mouth daily.    [provider]  atorvastatin (LIPITOR) 80 MG tablet TAKE 1 TABLET DAILY 02/01/22   Tysinger, Camelia Eng, PA-C  colchicine 0.6 MG tablet Take 1 tablet (0.6 mg total) by mouth 2 (two) times daily. Take 2 tablets at onset of gout pain.  Take an additional 1 tablet an hour later, if needed 09/15/21   Tysinger, Camelia Eng, PA-C  Continuous Blood Gluc Sensor (FREESTYLE LIBRE 2 SENSOR) MISC by Does not apply route.    [provider]  famotidine (PEPCID) 20 MG tablet Take 20 mg by mouth 2 (two) times daily as needed.    [provider]  HYDROcodone-acetaminophen (NORCO) 5-325 MG tablet Take 1 tablet by mouth every 6 (six) hours as needed.  12/23/21   Tysinger, Camelia Eng, PA-C  insulin glargine, 1 Unit Dial, (TOUJEO) 300 UNIT/ML Solostar Pen Inject 10 Units into the skin daily. 08/05/21   Tysinger, Camelia Eng, PA-C  Insulin Pen Needle (BD PEN NEEDLE NANO U/F) 32G X 4 MM MISC 1 each by Does not apply route at bedtime. 07/27/21   Tysinger, Camelia Eng, PA-C  LUMIGAN 0.01 % SOLN 1 drop at bedtime. 08/11/21   [provider]  Misc Natural Products (TART CHERRY ADVANCED PO) Take 1 capsule by mouth daily.    [provider]  Prenatal Vit-Fe Fumarate-FA (PRENATAL VITAMIN) 27-0.8 MG TABS Take 1 tablet by mouth daily. 08/04/21   Tysinger, Camelia Eng, PA-C  Suvorexant (BELSOMRA) 15 MG TABS Take 1 tablet by mouth daily. Patient taking differently: Take 1 tablet by mouth as needed. 09/15/21   Tysinger, Camelia Eng, PA-C     Family History  Problem Relation Age of Onset   Breast cancer Mother    Breast cancer Maternal Grandmother    Diabetes Mellitus II Paternal Grandfather    Throat cancer Paternal Grandfather    Stomach cancer Neg Hx    Esophageal cancer Neg Hx    Pancreatic cancer Neg Hx    Colon cancer Neg Hx     Social History   Socioeconomic History   Marital status: Married    Spouse name: Not on file   Number of children: Not on file   Years of education: Not on file   Highest education level: Not on  file  Occupational History   Not on file  Tobacco Use   Smoking status: Former    Packs/day: 1.25    Years: 22.00    Pack years: 27.50    Types: E-cigarettes, Cigarettes    Quit date: 2002    Years since quitting: 21.1   Smokeless tobacco: Never   Tobacco comments:    Stopped smoking 46yrs ago  Vaping Use   Vaping Use: Never used  Substance and Sexual Activity   Alcohol use: Yes    Comment: Occ.   Drug use: Not Currently    Comment: gummies   Sexual activity: Yes  Other Topics Concern   Not on file  Social History Narrative   Not on file   Social Determinants of Health   Financial Resource Strain: Not on file   Food Insecurity: Not on file  Transportation Needs: Not on file  Physical Activity: Not on file  Stress: Not on file  Social Connections: Not on file      Review of Systems currently denies fever, headache, chest pain, dyspnea, cough, abdominal/back pain, nausea, vomiting or bleeding  Vital Signs: BP (!) 170/102    Pulse 77    Temp 98.8 F (37.1 C) (Oral)    Resp 18    Ht 5\' 2"  (1.575 m)    Wt 218 lb (98.9 kg)    SpO2 100%    BMI 39.87 kg/m   Physical Exam awake ,alert.  Chest clear to auscultation bilaterally.  Heart with regular rate and rhythm.  Abdomen soft,  positive bowel sounds, nontender.  No lower extremity edema.  Imaging: DG Bone Density  Result Date: 01/27/2022 Table formatting from the original result was not included. Date of study: 01/24/2022 Exam: DUAL X-RAY ABSORPTIOMETRY (DXA) FOR BONE MINERAL DENSITY (BMD) Instrument: [employer] Requesting Provider: Dr. Havery Moros Indication: screening for osteoporosis in patient with primary biliary cholangitis Comparison: none (please note that it is not possible to compare data from different instruments) Clinical data: Pt is a 61 y.o. female with reported previous fractures of elbow, foot.  On calcium and vitamin D. Results:  Lumbar spine L1-L3 (L4) Femoral neck (FN) 33% distal radius T-score  +2.9 RFN: +1.6 LFN: +1.4 n/a Assessment: the BMD is normal according to the [facility] classification for osteoporosis (see below). Fracture risk: low FRAX score: not calculated due to normal BMD Comments: the technical quality of the study is good, however, L4 vertebra had to be excluded from analysis due to degenerative changes. Recommend optimizing calcium (1200 mg/day) and vitamin D (800 IU/day) intake. No pharmacological treatment is indicated. Followup: Repeat BMD is appropriate after 2 years. WHO criteria for diagnosis of osteoporosis in postmenopausal women and in men 43 y/o or older: - normal: T-score -1.0 to + 1.0 - osteopenia/low bone  density: T-score between -2.5 and -1.0 - osteoporosis: T-score below -2.5 - severe osteoporosis: T-score below -2.5 with history of fragility fracture Note: although not part of the WHO classification, the presence of a fragility fracture, regardless of the T-score, should be considered diagnostic of osteoporosis, provided other causes for the fracture have been excluded. Philemon Kingdom, MD [facility] ]   Labs:  CBC: Recent Labs    09/12/21 1211  WBC 8.9  HGB 11.7  HCT 36.5  PLT 427    COAGS: Recent Labs    01/12/22 1058  INR 0.8    BMP: Recent Labs    07/13/21 1215 09/12/21 1211  NA 144 141  K 4.1 3.9  CL 102 104  CO2 25 23  GLUCOSE 124* 138*  BUN 26* 17  CALCIUM 10.6* 9.8  CREATININE 1.84* 1.47*    LIVER FUNCTION TESTS: Recent Labs    07/13/21 1215 07/19/21 1324 09/12/21 1211 01/12/22 1058  BILITOT 0.3  --  <0.2 0.4  AST 49*  --  25 25  ALT 43*  --  41* 21  ALKPHOS 236* 206* 252* 167*  PROT 8.1  --  7.2 8.1  ALBUMIN 5.2*  --  4.6 4.6    TUMOR MARKERS: No results for input(s): AFPTM, CEA, CA199, CHROMGRNA in the last 8760 hours.  Assessment and Plan: 61 y.o. female ex smoker with past medical history of anemia, thyroid cancer, hyperlipidemia,  hypothyroidism, obstructive sleep apnea, obesity with prior gastric bypass, DM , renal insufficiency and hypertension who presents now with elevated liver function tests, fatty liver by imaging, positive AMA and elevated alkaline phosphatase.  She presents today for image guided random core liver biopsy to rule out suspected PBC. Risks and benefits of procedure  was discussed with the patient  including, but not limited to bleeding, infection, damage to adjacent structures or low yield requiring additional tests.  All of the questions were answered and there is agreement to proceed.  Consent signed and in chart.     Thank you for this interesting consult.  I greatly enjoyed meeting Lauren [name] and  look forward to participating in their care.  A copy of this report was sent to the requesting provider on this date.  Electronically Signed: D. Rowe Robert, PA-C 02/03/2022, 12:08 PM   I spent a total of  25 minutes   in face to face in clinical consultation, greater than 50% of which was counseling/coordinating care for image guided random core liver biopsy

## 2022-02-03 NOTE — Telephone Encounter (Signed)
Called and left detailed VM for patient (ok per DPR) letting her know that cpap showed control and to use when she is asleep for max benefit. Advised patient to return call if she felt that the pressures were too high or had questions.. Nothing further needed.

## 2022-02-03 NOTE — Procedures (Signed)
°  Procedure: [place] core liver biopsy R lobe 18g x2 EBL:   minimal Complications:  none immediate  See full dictation in [facility].  Dillard Cannon MD Main # [phone] Pager  [phone] Mobile [phone]

## 2022-02-04 ENCOUNTER — Emergency Department (HOSPITAL_COMMUNITY): Payer: Managed Care, Other (non HMO)

## 2022-02-04 ENCOUNTER — Encounter (HOSPITAL_COMMUNITY): Payer: Self-pay

## 2022-02-04 ENCOUNTER — Other Ambulatory Visit: Payer: Self-pay

## 2022-02-04 ENCOUNTER — Encounter: Payer: Self-pay | Admitting: Gastroenterology

## 2022-02-04 ENCOUNTER — Inpatient Hospital Stay (HOSPITAL_COMMUNITY)
Admission: EM | Admit: 2022-02-04 | Discharge: 2022-02-07 | DRG: 919 | Disposition: A | Discharge status: Home / Self Care | Payer: Managed Care, Other (non HMO) | Attending: Internal Medicine | Admitting: Internal Medicine

## 2022-02-04 DIAGNOSIS — D72829 Elevated white blood cell count, unspecified: Secondary | ICD-10-CM | POA: Diagnosis present

## 2022-02-04 DIAGNOSIS — I129 Hypertensive chronic kidney disease with stage 1 through stage 4 chronic kidney disease, or unspecified chronic kidney disease: Secondary | ICD-10-CM | POA: Diagnosis present

## 2022-02-04 DIAGNOSIS — S72009A Fracture of unspecified part of neck of unspecified femur, initial encounter for closed fracture: Secondary | ICD-10-CM | POA: Diagnosis present

## 2022-02-04 DIAGNOSIS — S36112A Contusion of liver, initial encounter: Secondary | ICD-10-CM | POA: Diagnosis present

## 2022-02-04 DIAGNOSIS — E119 Type 2 diabetes mellitus without complications: Secondary | ICD-10-CM

## 2022-02-04 DIAGNOSIS — E785 Hyperlipidemia, unspecified: Secondary | ICD-10-CM | POA: Diagnosis present

## 2022-02-04 DIAGNOSIS — Z885 Allergy status to narcotic agent status: Secondary | ICD-10-CM

## 2022-02-04 DIAGNOSIS — E1122 Type 2 diabetes mellitus with diabetic chronic kidney disease: Secondary | ICD-10-CM | POA: Diagnosis present

## 2022-02-04 DIAGNOSIS — K760 Fatty (change of) liver, not elsewhere classified: Secondary | ICD-10-CM | POA: Diagnosis present

## 2022-02-04 DIAGNOSIS — R0781 Pleurodynia: Secondary | ICD-10-CM

## 2022-02-04 DIAGNOSIS — Z87891 Personal history of nicotine dependence: Secondary | ICD-10-CM

## 2022-02-04 DIAGNOSIS — Y848 Other medical procedures as the cause of abnormal reaction of the patient, or of later complication, without mention of misadventure at the time of the procedure: Secondary | ICD-10-CM | POA: Diagnosis present

## 2022-02-04 DIAGNOSIS — N1832 Chronic kidney disease, stage 3b: Secondary | ICD-10-CM | POA: Diagnosis present

## 2022-02-04 DIAGNOSIS — K743 Primary biliary cirrhosis: Secondary | ICD-10-CM | POA: Diagnosis present

## 2022-02-04 DIAGNOSIS — R112 Nausea with vomiting, unspecified: Secondary | ICD-10-CM

## 2022-02-04 DIAGNOSIS — G4733 Obstructive sleep apnea (adult) (pediatric): Secondary | ICD-10-CM | POA: Diagnosis present

## 2022-02-04 DIAGNOSIS — Z85850 Personal history of malignant neoplasm of thyroid: Secondary | ICD-10-CM

## 2022-02-04 DIAGNOSIS — Z20822 Contact with and (suspected) exposure to covid-19: Secondary | ICD-10-CM | POA: Diagnosis present

## 2022-02-04 DIAGNOSIS — I10 Essential (primary) hypertension: Secondary | ICD-10-CM | POA: Diagnosis present

## 2022-02-04 DIAGNOSIS — Z794 Long term (current) use of insulin: Secondary | ICD-10-CM

## 2022-02-04 DIAGNOSIS — R1011 Right upper quadrant pain: Secondary | ICD-10-CM

## 2022-02-04 DIAGNOSIS — K91870 Postprocedural hematoma of a digestive system organ or structure following a digestive system procedure: Principal | ICD-10-CM | POA: Diagnosis present

## 2022-02-04 DIAGNOSIS — K661 Hemoperitoneum: Secondary | ICD-10-CM | POA: Diagnosis present

## 2022-02-04 DIAGNOSIS — Z833 Family history of diabetes mellitus: Secondary | ICD-10-CM

## 2022-02-04 DIAGNOSIS — Z6839 Body mass index (BMI) 39.0-39.9, adult: Secondary | ICD-10-CM

## 2022-02-04 DIAGNOSIS — Z79899 Other long term (current) drug therapy: Secondary | ICD-10-CM

## 2022-02-04 DIAGNOSIS — R0602 Shortness of breath: Secondary | ICD-10-CM

## 2022-02-04 LAB — COMPREHENSIVE METABOLIC PANEL
ALT: 112 U/L — ABNORMAL HIGH (ref 0–44)
AST: 96 U/L — ABNORMAL HIGH (ref 15–41)
Albumin: 4.1 g/dL (ref 3.5–5.0)
Alkaline Phosphatase: 144 U/L — ABNORMAL HIGH (ref 38–126)
Anion gap: 9 (ref 5–15)
BUN: 23 mg/dL — ABNORMAL HIGH (ref 6–20)
CO2: 25 mmol/L (ref 22–32)
Calcium: 9.3 mg/dL (ref 8.9–10.3)
Chloride: 102 mmol/L (ref 98–111)
Creatinine, Ser: 1.53 mg/dL — ABNORMAL HIGH (ref 0.44–1.00)
GFR, Estimated: 39 mL/min — ABNORMAL LOW (ref >60)
Glucose, Bld: 141 mg/dL — ABNORMAL HIGH (ref 70–99)
Potassium: 3.6 mmol/L (ref 3.5–5.1)
Sodium: 136 mmol/L (ref 135–145)
Total Bilirubin: 0.3 mg/dL (ref 0.3–1.2)
Total Protein: 7.5 g/dL (ref 6.5–8.1)

## 2022-02-04 LAB — CBC WITH DIFFERENTIAL/PLATELET
Abs Immature Granulocytes: 0.05 10*3/uL (ref 0.00–0.07)
Basophils Absolute: 0 10*3/uL (ref 0.0–0.1)
Basophils Relative: 0 %
Eosinophils Absolute: 0.2 10*3/uL (ref 0.0–0.5)
Eosinophils Relative: 2 %
HCT: 33.7 % — ABNORMAL LOW (ref 36.0–46.0)
Hemoglobin: 10.8 g/dL — ABNORMAL LOW (ref 12.0–15.0)
Immature Granulocytes: 0 %
Lymphocytes Relative: 17 %
Lymphs Abs: 2.1 10*3/uL (ref 0.7–4.0)
MCH: 27.1 pg (ref 26.0–34.0)
MCHC: 32 g/dL (ref 30.0–36.0)
MCV: 84.5 fL (ref 80.0–100.0)
Monocytes Absolute: 0.9 10*3/uL (ref 0.1–1.0)
Monocytes Relative: 7 %
Neutro Abs: 9.2 10*3/uL — ABNORMAL HIGH (ref 1.7–7.7)
Neutrophils Relative %: 74 %
Platelets: 344 10*3/uL (ref 150–400)
RBC: 3.99 MIL/uL (ref 3.87–5.11)
RDW: 15.8 % — ABNORMAL HIGH (ref 11.5–15.5)
WBC: 12.6 10*3/uL — ABNORMAL HIGH (ref 4.0–10.5)
nRBC: 0 % (ref 0.0–0.2)

## 2022-02-04 LAB — PROTIME-INR
INR: 0.9 (ref 0.8–1.2)
Prothrombin Time: 12.4 seconds (ref 11.4–15.2)

## 2022-02-04 LAB — LIPASE, BLOOD: Lipase: 47 U/L (ref 11–51)

## 2022-02-04 LAB — AMMONIA: Ammonia: 20 umol/L (ref 9–35)

## 2022-02-04 LAB — LACTIC ACID, PLASMA: Lactic Acid, Venous: 1.3 mmol/L (ref 0.5–1.9)

## 2022-02-04 MED ORDER — MORPHINE SULFATE (PF) 4 MG/ML IV SOLN
4.0000 mg | Freq: Once | INTRAVENOUS | Status: AC
  Administered 2022-02-04: 4 mg via INTRAVENOUS
  Filled 2022-02-04: qty 1

## 2022-02-04 MED ORDER — ONDANSETRON HCL 4 MG/2ML IJ SOLN
4.0000 mg | Freq: Once | INTRAMUSCULAR | Status: AC
  Administered 2022-02-04: 4 mg via INTRAVENOUS
  Filled 2022-02-04: qty 2

## 2022-02-04 MED ORDER — IOHEXOL 350 MG/ML SOLN
100.0000 mL | Freq: Once | INTRAVENOUS | Status: AC | PRN
  Administered 2022-02-05: 100 mL via INTRAVENOUS

## 2022-02-04 NOTE — ED Provider Notes (Signed)
[facility] DEPT Provider Note   CSN: [id number] Arrival date & time: 02/04/22  2107     History  Chief Complaint  Patient presents with   Abdominal Pain    Lauren [name] is a 61 y.o. female.  The history is provided by the patient and medical records.  Abdominal Pain Pain location:  R flank and RUQ Pain quality: aching, dull, pressure and sharp   Pain radiates to:  R shoulder, chest and R flank Pain severity:  Severe Onset quality:  Gradual Duration:  1 day Timing:  Constant Progression:  Unchanged Chronicity:  New Context: previous surgery (liver biopsy yesterday)   Relieved by:  Nothing Worsened by:  Nothing Associated symptoms: chest pain, chills, fatigue, nausea, shortness of breath and vomiting   Associated symptoms: no constipation, no cough, no diarrhea, no dysuria, no fever, no vaginal bleeding and no vaginal discharge   Risk factors: multiple surgeries       Home Medications Prior to Admission medications   Medication Sig Start Date End Date Taking? Authorizing Provider  allopurinol (ZYLOPRIM) 100 MG tablet Take 2 tablets (200 mg total) by mouth daily. 01/09/22   Tysinger, Camelia Eng, PA-C  amLODipine-valsartan (EXFORGE) 5-160 MG tablet Take 1 tablet by mouth daily.    [provider]  atorvastatin (LIPITOR) 80 MG tablet TAKE 1 TABLET DAILY 02/01/22   Tysinger, Camelia Eng, PA-C  colchicine 0.6 MG tablet Take 1 tablet (0.6 mg total) by mouth 2 (two) times daily. Take 2 tablets at onset of gout pain.  Take an additional 1 tablet an hour later, if needed 09/15/21   Tysinger, Camelia Eng, PA-C  Continuous Blood Gluc Sensor (FREESTYLE LIBRE 2 SENSOR) MISC by Does not apply route.    [provider]  famotidine (PEPCID) 20 MG tablet Take 20 mg by mouth 2 (two) times daily as needed.    [provider]  HYDROcodone-acetaminophen (NORCO) 5-325 MG tablet Take 1 tablet by mouth every 6 (six) hours as needed. 12/23/21   Tysinger,  Camelia Eng, PA-C  insulin glargine, 1 Unit Dial, (TOUJEO) 300 UNIT/ML Solostar Pen Inject 10 Units into the skin daily. 08/05/21   Tysinger, Camelia Eng, PA-C  Insulin Pen Needle (BD PEN NEEDLE NANO U/F) 32G X 4 MM MISC 1 each by Does not apply route at bedtime. 07/27/21   Tysinger, Camelia Eng, PA-C  LUMIGAN 0.01 % SOLN 1 drop at bedtime. 08/11/21   [provider]  Misc Natural Products (TART CHERRY ADVANCED PO) Take 1 capsule by mouth daily.    [provider]  Prenatal Vit-Fe Fumarate-FA (PRENATAL VITAMIN) 27-0.8 MG TABS Take 1 tablet by mouth daily. 08/04/21   Tysinger, Camelia Eng, PA-C  Suvorexant (BELSOMRA) 15 MG TABS Take 1 tablet by mouth daily. Patient taking differently: Take 1 tablet by mouth as needed. 09/15/21   Tysinger, Camelia Eng, PA-C      Allergies    Oxycodone    Review of Systems   Review of Systems  Constitutional:  Positive for chills and fatigue. Negative for fever.  HENT:  Negative for congestion.   Respiratory:  Positive for chest tightness and shortness of breath. Negative for cough.   Cardiovascular:  Positive for chest pain. Negative for palpitations and leg swelling.  Gastrointestinal:  Positive for abdominal pain, nausea and vomiting. Negative for constipation and diarrhea.  Genitourinary:  Positive for flank pain. Negative for dysuria, frequency, vaginal bleeding and vaginal discharge.       Darkened urine  Musculoskeletal:  Positive for back pain. Negative for neck pain and neck stiffness.  Skin:  Negative for rash and wound.  Neurological:  Negative for light-headedness, numbness and headaches.  Psychiatric/Behavioral:  Negative for agitation.   All other systems reviewed and are negative.  Physical Exam Updated Vital Signs BP (!) 160/99 (BP Location: Left Arm)    Pulse 86    Temp 98.5 F (36.9 C) (Oral)    Resp (!) 24    Ht 5\' 2"  (1.575 m)    Wt 98.9 kg    SpO2 98%    BMI 39.87 kg/m  Physical Exam Vitals and nursing note reviewed.  Constitutional:       General: She is not in acute distress.    Appearance: She is well-developed. She is ill-appearing. She is not toxic-appearing or diaphoretic.  HENT:     Head: Normocephalic and atraumatic.  Eyes:     Conjunctiva/sclera: Conjunctivae normal.  Cardiovascular:     Rate and Rhythm: Regular rhythm. Tachycardia present.     Heart sounds: No murmur heard. Pulmonary:     Effort: Pulmonary effort is normal. No respiratory distress.     Breath sounds: Normal breath sounds. No wheezing, rhonchi or rales.  Chest:     Chest wall: No tenderness.  Abdominal:     General: Abdomen is flat. Bowel sounds are normal.     Palpations: Abdomen is soft.     Tenderness: There is abdominal tenderness in the right upper quadrant. There is right CVA tenderness. There is no left CVA tenderness.  Musculoskeletal:        General: No swelling.     Cervical back: Neck supple.  Skin:    General: Skin is warm and dry.     Capillary Refill: Capillary refill takes less than 2 seconds.  Neurological:     General: No focal deficit present.     Mental Status: She is alert.  Psychiatric:        Mood and Affect: Mood is anxious.    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 The benefits, risks, alternatives, and complications were also discussed. The patient understands and wishes to proceed with the procedure. A verbal as  well as written consent was obtained. Survey ultrasound of the liver was performed and an appropriate skin entry site was determined. Skin site was marked, prepped with Betadine, and draped in usual sterile fashion, and infiltrated locally with 1% lidocaine. Intravenous Fentanyl 163mcg and Versed 2mg  were administered as conscious sedation during continuous monitoring of the patient's level of consciousness and physiological / cardiorespiratory status by the radiology RN, with a total moderate sedation time of 10 minutes. A 17 gauge trocar needle was advanced under ultrasound guidance into the liver, right lobe anterior. 2 solid-appearing coaxial 18gauge core samples were then obtained through the guide needle. The guide needle was removed. Post procedure scans demonstrate no apparent complication. COMPLICATIONS: COMPLICATIONS None immediate FINDINGS: No focal liver lesion  was identified. Several layering gallstones in the nondistended gallbladder incidentally noted. Representative core biopsy samples obtained as above. IMPRESSION: 1. Technically successful ultrasound guided core liver biopsy. 2. Cholelithiasis Electronically Signed   By: Lucrezia Europe M.D.   On: 02/03/2022 16:27    Procedures Procedures    Medications Ordered in ED Medications  morphine (PF) 4 MG/ML injection 4 mg (has no administration in time range)  ondansetron (ZOFRAN) injection 4 mg (has no administration in time range)    ED Course/ Medical Decision Making/ A&P                           Medical Decision Making Amount and/or Complexity of Data Reviewed Labs: ordered. Radiology: ordered.  Risk Prescription drug management.    Kalayah Pagnotta is a 61 y.o. female with a past medical history significant for previous thyroid cancer, kidney disease, diabetes, hypertension, hyperlipidemia, previous gastric bypass surgery, and liver biopsy performed yesterday who presents with worsening abdominal pain, chest pain, shortness of breath,  darkened urine, and fatigue.  According to patient, she had a liver biopsy yesterday and did not have any complications.  She reports that since last night and through the day she had worsened pain in her right upper quadrant that radiates around towards her flank.  She also has pain in her right chest and right shoulder that is very pleuritic.  She reports her urine has been darker and she is also nausea and vomiting.  She reports some chills and fatigue but otherwise denies fevers at home.  Denies cough.  Denies any history of DVT or PE.  She denies any changes with her bowel movements.  Reports the pain is 10 out of 10 in severity at times but is now 8 out of 10 when resting.  Significant pain with any rolling or moving.  On exam, patient had exquisite tenderness to her right upper quadrant and right flank.  She also had clear breath sounds bilaterally and did not have tenderness in the shoulder but was having pain going to her right shoulder.  She had bowel sounds that were appreciated bilaterally.  No rhonchi.  No focal neurologic deficits initially.  Patient was tachycardic on arrival but had improved while resting.  Clinically I am concerned about a postprocedural complication such as bleeding, hematoma, or other abnormality.  I am also concerned for possible PE given the pleuritic discomfort with shortness of breath and tachycardia.  We will get CT PE study, will get CT abdomen pelvis, and will get screening labs.  We will get urinalysis due to the flank pain and darkened urine.  We will give pain and nausea medicine.  Anticipate reassessment after work-up.     Care transferred to oncoming team to await work-up results.        Final Clinical Impression(s) / ED Diagnoses Final diagnoses:  Right upper quadrant abdominal pain  Nausea and vomiting, unspecified vomiting type  Pleuritic chest pain  Shortness of breath    Clinical Impression: 1. Right upper quadrant abdominal pain   2.  Nausea and vomiting, unspecified vomiting type   3. Pleuritic chest pain   4. Shortness of breath     Disposition: Care transferred to oncoming team to await work-up results.  This note was prepared with assistance of Systems analyst. Occasional wrong-word or sound-a-like substitutions may have occurred due to the inherent limitations of voice recognition software.      Wylee Ogden, Gwenyth Allegra,  MD 02/05/22 2043

## 2022-02-04 NOTE — ED Triage Notes (Signed)
Pt. BIB [facility] c/o pain post liver biopsy that was done yesterday. She states that the pain starts in her abdomen and moves to her back and collar bone. She took hydrocodone at 7:45 with no relief.  EMS VS: BP: 154/90 HR: 86 CBG:120 O2: 96%

## 2022-02-05 ENCOUNTER — Encounter (HOSPITAL_COMMUNITY): Payer: Self-pay | Admitting: Family Medicine

## 2022-02-05 DIAGNOSIS — G4733 Obstructive sleep apnea (adult) (pediatric): Secondary | ICD-10-CM

## 2022-02-05 DIAGNOSIS — Z794 Long term (current) use of insulin: Secondary | ICD-10-CM

## 2022-02-05 DIAGNOSIS — S36112A Contusion of liver, initial encounter: Secondary | ICD-10-CM

## 2022-02-05 DIAGNOSIS — N1832 Chronic kidney disease, stage 3b: Secondary | ICD-10-CM

## 2022-02-05 DIAGNOSIS — I10 Essential (primary) hypertension: Secondary | ICD-10-CM

## 2022-02-05 DIAGNOSIS — R112 Nausea with vomiting, unspecified: Secondary | ICD-10-CM

## 2022-02-05 DIAGNOSIS — R1011 Right upper quadrant pain: Secondary | ICD-10-CM

## 2022-02-05 DIAGNOSIS — E119 Type 2 diabetes mellitus without complications: Secondary | ICD-10-CM

## 2022-02-05 LAB — CBC
HCT: 32.1 % — ABNORMAL LOW (ref 36.0–46.0)
Hemoglobin: 10.3 g/dL — ABNORMAL LOW (ref 12.0–15.0)
MCH: 27.4 pg (ref 26.0–34.0)
MCHC: 32.1 g/dL (ref 30.0–36.0)
MCV: 85.4 fL (ref 80.0–100.0)
Platelets: 325 10*3/uL (ref 150–400)
RBC: 3.76 MIL/uL — ABNORMAL LOW (ref 3.87–5.11)
RDW: 15.9 % — ABNORMAL HIGH (ref 11.5–15.5)
WBC: 12.6 10*3/uL — ABNORMAL HIGH (ref 4.0–10.5)
nRBC: 0 % (ref 0.0–0.2)

## 2022-02-05 LAB — URINALYSIS, ROUTINE W REFLEX MICROSCOPIC
Bacteria, UA: NONE SEEN
Bilirubin Urine: NEGATIVE
Glucose, UA: NEGATIVE mg/dL
Hgb urine dipstick: NEGATIVE
Ketones, ur: NEGATIVE mg/dL
Leukocytes,Ua: NEGATIVE
Nitrite: NEGATIVE
Protein, ur: >=300 mg/dL — AB
Specific Gravity, Urine: 1.015 (ref 1.005–1.030)
pH: 6 (ref 5.0–8.0)

## 2022-02-05 LAB — CBG MONITORING, ED: Glucose-Capillary: 137 mg/dL — ABNORMAL HIGH (ref 70–99)

## 2022-02-05 LAB — COMPREHENSIVE METABOLIC PANEL
ALT: 111 U/L — ABNORMAL HIGH (ref 0–44)
AST: 87 U/L — ABNORMAL HIGH (ref 15–41)
Albumin: 3.9 g/dL (ref 3.5–5.0)
Alkaline Phosphatase: 133 U/L — ABNORMAL HIGH (ref 38–126)
Anion gap: 8 (ref 5–15)
BUN: 21 mg/dL — ABNORMAL HIGH (ref 6–20)
CO2: 28 mmol/L (ref 22–32)
Calcium: 9.2 mg/dL (ref 8.9–10.3)
Chloride: 100 mmol/L (ref 98–111)
Creatinine, Ser: 1.58 mg/dL — ABNORMAL HIGH (ref 0.44–1.00)
GFR, Estimated: 37 mL/min — ABNORMAL LOW (ref >60)
Glucose, Bld: 143 mg/dL — ABNORMAL HIGH (ref 70–99)
Potassium: 4.2 mmol/L (ref 3.5–5.1)
Sodium: 136 mmol/L (ref 135–145)
Total Bilirubin: 0.5 mg/dL (ref 0.3–1.2)
Total Protein: 7.3 g/dL (ref 6.5–8.1)

## 2022-02-05 LAB — HIV ANTIBODY (ROUTINE TESTING W REFLEX): HIV Screen 4th Generation wRfx: NONREACTIVE

## 2022-02-05 LAB — RESP PANEL BY RT-PCR (FLU A&B, COVID) ARPGX2
Influenza A by PCR: NEGATIVE
Influenza B by PCR: NEGATIVE
SARS Coronavirus 2 by RT PCR: NEGATIVE

## 2022-02-05 LAB — GLUCOSE, CAPILLARY
Glucose-Capillary: 128 mg/dL — ABNORMAL HIGH (ref 70–99)
Glucose-Capillary: 145 mg/dL — ABNORMAL HIGH (ref 70–99)
Glucose-Capillary: 132 mg/dL — ABNORMAL HIGH (ref 70–99)
Glucose-Capillary: 163 mg/dL — ABNORMAL HIGH (ref 70–99)

## 2022-02-05 LAB — LACTIC ACID, PLASMA: Lactic Acid, Venous: 1.7 mmol/L (ref 0.5–1.9)

## 2022-02-05 MED ORDER — POLYETHYLENE GLYCOL 3350 17 G PO PACK
17.0000 g | PACK | Freq: Every day | ORAL | Status: AC
  Administered 2022-02-05: 17 g via ORAL
  Filled 2022-02-05: qty 1

## 2022-02-05 MED ORDER — ONDANSETRON HCL 4 MG/2ML IJ SOLN: 4.0000 mg | Freq: Four times a day (QID) | INTRAMUSCULAR | Status: DC | PRN

## 2022-02-05 MED ORDER — SENNOSIDES-DOCUSATE SODIUM 8.6-50 MG PO TABS: 1.0000 | ORAL_TABLET | Freq: Every evening | ORAL | Status: DC | PRN

## 2022-02-05 MED ORDER — AMLODIPINE BESYLATE-VALSARTAN 5-160 MG PO TABS: 1.0000 | ORAL_TABLET | Freq: Every day | ORAL | Status: DC

## 2022-02-05 MED ORDER — ALLOPURINOL 100 MG PO TABS
200.0000 mg | ORAL_TABLET | Freq: Every day | ORAL | Status: DC
  Administered 2022-02-05 – 2022-02-07 (×3): 200 mg via ORAL
  Filled 2022-02-05 (×4): qty 2

## 2022-02-05 MED ORDER — ACETAMINOPHEN 325 MG PO TABS
650.0000 mg | ORAL_TABLET | Freq: Four times a day (QID) | ORAL | Status: DC | PRN
  Filled 2022-02-05: qty 2

## 2022-02-05 MED ORDER — IRBESARTAN 150 MG PO TABS
150.0000 mg | ORAL_TABLET | Freq: Every day | ORAL | Status: DC
  Administered 2022-02-05 – 2022-02-07 (×3): 150 mg via ORAL
  Filled 2022-02-05 (×4): qty 1

## 2022-02-05 MED ORDER — ATORVASTATIN CALCIUM 40 MG PO TABS
80.0000 mg | ORAL_TABLET | Freq: Every day | ORAL | Status: DC
  Administered 2022-02-05 – 2022-02-07 (×3): 80 mg via ORAL
  Filled 2022-02-05 (×3): qty 2

## 2022-02-05 MED ORDER — INSULIN ASPART 100 UNIT/ML IJ SOLN
0.0000 [IU] | INTRAMUSCULAR | Status: DC
  Administered 2022-02-05 – 2022-02-06 (×3): 1 [IU] via SUBCUTANEOUS
  Filled 2022-02-05: qty 0.06

## 2022-02-05 MED ORDER — FAMOTIDINE 20 MG PO TABS: 20.0000 mg | ORAL_TABLET | Freq: Two times a day (BID) | ORAL | Status: DC | PRN

## 2022-02-05 MED ORDER — DIPHENHYDRAMINE HCL 25 MG PO CAPS
25.0000 mg | ORAL_CAPSULE | Freq: Four times a day (QID) | ORAL | Status: DC | PRN
  Administered 2022-02-05 – 2022-02-06 (×3): 25 mg via ORAL
  Filled 2022-02-05 (×4): qty 1

## 2022-02-05 MED ORDER — HYDROMORPHONE HCL 1 MG/ML IJ SOLN
0.5000 mg | INTRAMUSCULAR | Status: DC | PRN
  Administered 2022-02-05: 1 mg via INTRAVENOUS
  Filled 2022-02-05: qty 1

## 2022-02-05 MED ORDER — MORPHINE SULFATE (PF) 4 MG/ML IV SOLN
4.0000 mg | Freq: Once | INTRAVENOUS | Status: AC
  Administered 2022-02-05: 4 mg via INTRAVENOUS
  Filled 2022-02-05: qty 1

## 2022-02-05 MED ORDER — ONDANSETRON HCL 4 MG/2ML IJ SOLN
4.0000 mg | Freq: Once | INTRAMUSCULAR | Status: AC
  Administered 2022-02-05: 4 mg via INTRAVENOUS
  Filled 2022-02-05: qty 2

## 2022-02-05 MED ORDER — AMLODIPINE BESYLATE 5 MG PO TABS
5.0000 mg | ORAL_TABLET | Freq: Every day | ORAL | Status: DC
  Administered 2022-02-05 – 2022-02-07 (×3): 5 mg via ORAL
  Filled 2022-02-05 (×3): qty 1

## 2022-02-05 MED ORDER — ACETAMINOPHEN 650 MG RE SUPP: 650.0000 mg | Freq: Four times a day (QID) | RECTAL | Status: DC | PRN

## 2022-02-05 MED ORDER — OXYCODONE HCL 5 MG PO TABS
10.0000 mg | ORAL_TABLET | ORAL | Status: DC | PRN
  Administered 2022-02-05: 10 mg via ORAL
  Filled 2022-02-05 (×2): qty 2

## 2022-02-05 MED ORDER — HYDROMORPHONE HCL 1 MG/ML IJ SOLN
0.5000 mg | INTRAMUSCULAR | Status: DC | PRN
  Administered 2022-02-05 – 2022-02-06 (×5): 1 mg via INTRAVENOUS
  Filled 2022-02-05 (×5): qty 1

## 2022-02-05 MED ORDER — BISACODYL 5 MG PO TBEC: 5.0000 mg | DELAYED_RELEASE_TABLET | Freq: Every day | ORAL | Status: DC | PRN

## 2022-02-05 NOTE — Plan of Care (Signed)
Plan of care reviewed and discussed with the patient and her husband. 

## 2022-02-05 NOTE — ED Provider Notes (Addendum)
I received the patient in signout from Dr. Edward Qualia, briefly the patient is a 61 year old female who had a liver biopsy done yesterday complaining of right upper quadrant abdominal discomfort.  Patient has had a 1 g drop in her hemoglobin and we are awaiting CT scan of the chest abdomen pelvis.  CT scan has resulted and is negative for pulmonary embolism however shows a 8 cm intraparenchymal hematoma with a moderate subscapular hematoma and small amount of hemoperitoneum.  We will discuss the case with interventional radiology.  I discussed the case with Dr. Laurence Ferrari, interventional radiology.  Recommended medical admission, bed rest and they would round on her in the morning.     Deno Etienne, DO 02/05/22 Maish Vaya, [place], DO 02/05/22 [id number]

## 2022-02-05 NOTE — Plan of Care (Signed)
Plan of care discussed and initiated.

## 2022-02-05 NOTE — Plan of Care (Signed)
°  Problem: Education: Goal: Knowledge of General Education information will improve Description: Including pain rating scale, medication(s)/side effects and non-pharmacologic comfort measures Outcome: Progressing   Problem: Activity: Goal: Risk for activity intolerance will decrease Outcome: Progressing   Problem: Pain Managment: Goal: General experience of comfort will improve Outcome: Progressing   Problem: Elimination: Goal: Will not experience complications related to bowel motility Outcome: Progressing

## 2022-02-05 NOTE — Progress Notes (Signed)
[facility] PROGRESS NOTE    Progress Note  Lauren [name]  KZS:[id number] DOB: [birth year] DOA: 02/04/2022 PCP: Carlena Hurl, PA-C     Brief Narrative:   Lauren [name] is an 61 y.o. 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 IR consulted recommended conservative management bedrest narcotics and IV fluids. Follow hemoglobin and transfuse. Hemoglobin this morning is 10.3 she continues to have leukocytosis on CBC which I have reviewed. Tmax of 99.4. She relates her pain is not controlled add MiraLAX  Insulin-dependent diabetes mellitus type 2: With an A1c of 7.2, continue sliding scale insulin.  Essential hypertension: Continue valsartan and amlodipine.  Chronic kidney disease stage IIIb: Her creatinine appears to be at baseline.  Obstructive sleep apnea: Continue CPAP at night.  Morbid obesity: With a BMI of 39, she has been counseled.   DVT prophylaxis: scd Family Communication:husband Status is: Observation The patient remains OBS appropriate and will d/c before 2 midnights.  Pain is not controlled some nausea          Code Status:     Code Status Orders  (From admission, onward)           Start     Ordered   02/05/22 0243  Full code  Continuous        02/05/22 0243            Code Status History     This patient has a current code status but no historical code status.         IV Access:   Peripheral IV   Procedures and diagnostic studies:   CT Angio Chest PE W and/or Wo Contrast  Result Date: 02/05/2022 CLINICAL DATA:  Abdominal pain, post-op liver bx yesterday, now RUQ and back pain, CP, SOB; Pulmonary embolism (PE) suspected, high prob EXAM: CT ANGIOGRAPHY CHEST CT ABDOMEN AND PELVIS WITH CONTRAST TECHNIQUE: Multidetector CT imaging of the chest was performed using the standard protocol during bolus administration of intravenous contrast. Multiplanar CT image reconstructions and MIPs were obtained to evaluate the vascular anatomy. Multidetector CT imaging of the abdomen and pelvis was performed using the standard protocol during bolus administration of intravenous contrast. RADIATION DOSE REDUCTION: This exam was performed according to the departmental dose-optimization program which includes automated exposure control, adjustment of the mA and/or kV according to patient size and/or use of iterative reconstruction technique. CONTRAST:  19mL OMNIPAQUE IOHEXOL 350 MG/ML SOLN COMPARISON:  None. FINDINGS: CTA CHEST FINDINGS Cardiovascular: Adequate opacification of the pulmonary arterial tree. No intraluminal filling defect identified to suggest acute pulmonary embolism. Central pulmonary arteries are of normal caliber. No significant coronary artery calcification. Cardiac size within normal limits. No pericardial effusion. Mild atherosclerotic calcification within the thoracic aorta. No aortic aneurysm. Mediastinum/Nodes: Status post left thyroidectomy. Residual right visualized thyroid is unremarkable. Multiple calcified mediastinal lymph nodes are seen in keeping with old granulomatous disease. No pathologic thoracic adenopathy. Esophagus is unremarkable. Lungs/Pleura: Lungs are clear. No  pleural effusion or pneumothorax. Musculoskeletal: Degenerative changes seen  within the lumbar spine. No lytic or blastic bone lesions are seen. No acute bone abnormality. Review of the MIP images confirms the above findings. CT ABDOMEN and PELVIS FINDINGS Hepatobiliary: 5.1 x 8.3 cm intraparenchymal hematoma has developed within segment 7 of the liver with extension posterolaterally resulting in a 2.5 x 11.0 x 9.7 cm subcapsular hematoma. No active extravasation identified. No intra or extrahepatic biliary ductal dilation. Portal vein and hepatic veins appear patent. Cholelithiasis without pericholecystic inflammatory change noted. Pancreas: Unremarkable Spleen: Unremarkable Adrenals/Urinary Tract: The adrenal glands are unremarkable. The kidneys are normal in size and position. 4 mm nonobstructing calculus noted within the lower pole the right kidney. Simple cortical cyst noted within the lower pole the right kidney. The kidneys are otherwise unremarkable. Bladder is unremarkable. Stomach/Bowel: Small high attenuation free fluid is seen within the pelvis in keeping with mild hemoperitoneum. 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 The stomach, small bowel, and large bowel are otherwise unremarkable. Appendix normal. No free intraperitoneal gas. Vascular/Lymphatic: No significant vascular findings are present. No enlarged abdominal or pelvic lymph nodes. Reproductive: Uterus and bilateral adnexa are unremarkable. Other: Tiny fat containing umbilical hernia. Musculoskeletal: No acute bone abnormality. No lytic or blastic bone lesion. Degenerative changes are seen within the lumbar spine. Review of the MIP images confirms the above findings. IMPRESSION: No pulmonary embolism.  No acute intrathoracic pathology identified. 8.3 cm intraparenchymal hematoma within segment 7 liver communicating with a moderate subcapsular hematoma. Small hemoperitoneum. No active extravasation identified. Mild nonobstructing right nephrolithiasis. Electronically Signed   By: Fidela Salisbury M.D.   On:  02/05/2022 00:47   CT ABDOMEN PELVIS W CONTRAST  Result Date: 02/05/2022 CLINICAL DATA:  Abdominal pain, post-op liver bx yesterday, now RUQ and back pain, CP, SOB; Pulmonary embolism (PE) suspected, high prob EXAM: CT ANGIOGRAPHY CHEST CT ABDOMEN AND PELVIS WITH CONTRAST TECHNIQUE: Multidetector CT imaging of the chest was performed using the standard protocol during bolus administration of intravenous contrast. Multiplanar CT image reconstructions and MIPs were obtained to evaluate the vascular anatomy. Multidetector CT imaging of the abdomen and pelvis was performed using the standard protocol during bolus administration of intravenous contrast. RADIATION DOSE REDUCTION: This exam was performed according to the departmental dose-optimization program which includes automated exposure control, adjustment of the mA and/or kV according to patient size and/or use of iterative reconstruction technique. CONTRAST:  180mL OMNIPAQUE IOHEXOL 350 MG/ML SOLN COMPARISON:  None. FINDINGS: CTA CHEST FINDINGS Cardiovascular: Adequate opacification of the pulmonary arterial tree. No intraluminal filling defect identified to suggest acute pulmonary embolism. Central pulmonary arteries are of normal caliber. No significant coronary artery calcification. Cardiac size within normal limits. No pericardial effusion. Mild atherosclerotic calcification within the thoracic aorta. No aortic aneurysm. Mediastinum/Nodes: Status post left thyroidectomy. Residual right visualized thyroid is unremarkable. Multiple calcified mediastinal lymph nodes are seen in keeping with old granulomatous disease. No pathologic thoracic adenopathy. Esophagus is unremarkable. Lungs/Pleura: Lungs are clear. No pleural effusion or pneumothorax. Musculoskeletal: Degenerative changes seen within the lumbar spine. No lytic or blastic bone lesions are seen. No acute bone abnormality. Review of the MIP images confirms the above findings. CT ABDOMEN and PELVIS  FINDINGS Hepatobiliary: 5.1 x 8.3 cm intraparenchymal hematoma has developed within segment 7 of the liver with extension posterolaterally resulting in a 2.5 x 11.0 x 9.7 cm subcapsular hematoma. No active extravasation identified. No intra or extrahepatic biliary ductal dilation. Portal vein and hepatic veins appear patent. Cholelithiasis without  pericholecystic inflammatory change noted. Pancreas: Unremarkable Spleen: Unremarkable Adrenals/Urinary Tract: The adrenal glands are unremarkable. The kidneys are normal in size and position. 4 mm nonobstructing calculus noted within the lower pole the right kidney. Simple cortical cyst noted within the lower pole the right kidney. The kidneys are otherwise unremarkable. Bladder is unremarkable. Stomach/Bowel: Small high attenuation free fluid is seen within the pelvis in keeping with mild hemoperitoneum. 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 The stomach, small bowel, and large bowel are otherwise unremarkable. Appendix normal. No free intraperitoneal gas. Vascular/Lymphatic: No significant vascular findings are present. No enlarged abdominal or pelvic lymph nodes. Reproductive: Uterus and bilateral adnexa are unremarkable. Other: Tiny fat containing umbilical hernia. Musculoskeletal: No acute bone abnormality. No lytic or blastic bone lesion. Degenerative changes are seen within the lumbar spine. Review of the MIP images confirms the above findings. IMPRESSION: No pulmonary embolism.  No acute intrathoracic pathology identified. 8.3 cm intraparenchymal hematoma within segment 7 liver communicating with a moderate subcapsular hematoma. Small hemoperitoneum. No active extravasation identified. Mild nonobstructing right nephrolithiasis. Electronically Signed   By: Fidela Salisbury M.D.   On: 02/05/2022 00:47   US BIOPSY (LIVER)  Result Date: 02/03/2022 CLINICAL DATA:  Suspected primary biliary cholangitis, fatty liver, elevated LFTs EXAM: ULTRASOUND-GUIDED CORE  LIVER BIOPSY TECHNIQUE: An ultrasound guided liver biopsy was thoroughly discussed with the patient and questions were answered. The benefits, risks, alternatives, and complications were also discussed. The patient understands and wishes to proceed with the procedure. A verbal as well as written consent was obtained. Survey ultrasound of the liver was performed and an appropriate skin entry site was determined. Skin site was marked, prepped with Betadine, and draped in usual sterile fashion, and infiltrated locally with 1% lidocaine. Intravenous Fentanyl 117mcg and Versed 2mg  were administered as conscious sedation during continuous monitoring of the patient's level of consciousness and physiological / cardiorespiratory status by the radiology RN, with a total moderate sedation time of 10 minutes. A 17 gauge trocar needle was advanced under ultrasound guidance into the liver, right lobe anterior. 2 solid-appearing coaxial 18gauge core samples were then obtained through the guide needle. The guide needle was removed. Post procedure scans demonstrate no apparent complication. COMPLICATIONS: COMPLICATIONS None immediate FINDINGS: No focal liver lesion was identified. Several layering gallstones in the nondistended gallbladder incidentally noted. Representative core biopsy samples obtained as above. IMPRESSION: 1. Technically successful ultrasound guided core liver biopsy. 2. Cholelithiasis Electronically Signed   By: Lucrezia Europe M.D.   On: 02/03/2022 16:27     Medical Consultants:   None.   Subjective:    Lauren [name] relates her pain is not controlled every time she moves it hurts  Objective:    Vitals:   02/05/22 0030 02/05/22 0132 02/05/22 0345 02/05/22 0442  BP: 129/71 (!) 147/91 137/81 (!) 149/91  Pulse: 73 91 78 78  Resp: (!) 24 (!) 22 19 18   Temp:   99.2 F (37.3 C) 99.4 F (37.4 C)  TempSrc:    Oral  SpO2: 97% 98% 96% 94%  Weight:      Height:       SpO2: 94 %   Intake/Output  Summary (Last 24 hours) at 02/05/2022 0701 Last data filed at 02/05/2022 0500 Gross per 24 hour  Intake --  Output 300 ml  Net -300 ml   Filed Weights   02/04/22 2120  Weight: 98.9 kg    Exam: General exam: In no acute distress. Respiratory system: Good air movement and clear to  auscultation. Cardiovascular system: S1 & S2 heard, RRR. No JVD. Gastrointestinal system: Abdomen is nondistended, soft and nontender.  Extremities: No pedal edema. Skin: No rashes, lesions or ulcers Psychiatry: Judgement and insight appear normal. Mood & affect appropriate.    Data Reviewed:    Labs: Basic Metabolic Panel: Recent Labs  Lab 02/04/22 2214 02/05/22 0426  NA 136 136  K 3.6 4.2  CL 102 100  CO2 25 28  GLUCOSE 141* 143*  BUN 23* 21*  CREATININE 1.53* 1.58*  CALCIUM 9.3 9.2   GFR Estimated Creatinine Clearance: 41.6 mL/min (A) (by C-G formula based on SCr of 1.58 mg/dL (H)). Liver Function Tests: Recent Labs  Lab 02/04/22 2214 02/05/22 0426  AST 96* 87*  ALT 112* 111*  ALKPHOS 144* 133*  BILITOT 0.3 0.5  PROT 7.5 7.3  ALBUMIN 4.1 3.9   Recent Labs  Lab 02/04/22 2214  LIPASE 47   Recent Labs  Lab 02/04/22 2214  AMMONIA 20   Coagulation profile Recent Labs  Lab 02/03/22 1205 02/04/22 2214  INR 0.9 0.9   COVID-19 Labs  No results for input(s): DDIMER, FERRITIN, LDH, CRP in the last 72 hours.  Lab Results  Component Value Date   [place] NEGATIVE 02/05/2022    CBC: Recent Labs  Lab 02/03/22 1205 02/04/22 2214 02/05/22 0426  WBC 6.8 12.6* 12.6*  NEUTROABS  --  9.2*  --   HGB 11.9* 10.8* 10.3*  HCT 38.6 33.7* 32.1*  MCV 86.9 84.5 85.4  PLT 394 344 325   Cardiac Enzymes: No results for input(s): CKTOTAL, CKMB, CKMBINDEX, TROPONINI in the last 168 hours. BNP (last 3 results) No results for input(s): PROBNP in the last 8760 hours. CBG: Recent Labs  Lab 02/03/22 1144 02/05/22 0409  GLUCAP 101* 137*   D-Dimer: No results for input(s):  DDIMER in the last 72 hours. Hgb A1c: No results for input(s): HGBA1C in the last 72 hours. Lipid Profile: No results for input(s): CHOL, HDL, LDLCALC, TRIG, CHOLHDL, LDLDIRECT in the last 72 hours. Thyroid function studies: No results for input(s): TSH, T4TOTAL, T3FREE, THYROIDAB in the last 72 hours.  Invalid input(s): FREET3 Anemia work up: No results for input(s): VITAMINB12, FOLATE, FERRITIN, TIBC, IRON, RETICCTPCT in the last 72 hours. Sepsis Labs: Recent Labs  Lab 02/03/22 1205 02/04/22 2214 02/04/22 2358 02/05/22 0426  WBC 6.8 12.6*  --  12.6*  LATICACIDVEN  --  1.3 1.7  --    Microbiology Recent Results (from the past 240 hour(s))  Resp Panel by RT-PCR (Flu A&B, Covid) Nasopharyngeal Swab     Status: None   Collection Time: 02/05/22  1:50 AM   Specimen: Nasopharyngeal Swab; Nasopharyngeal(NP) swabs in vial transport medium  Result Value Ref Range Status   SARS Coronavirus 2 by RT PCR NEGATIVE NEGATIVE Final    Comment: (NOTE) SARS-CoV-2 target nucleic acids are NOT DETECTED.  The SARS-CoV-2 RNA is generally detectable in upper respiratory specimens during the acute phase of infection. The lowest concentration of SARS-CoV-2 viral copies this assay can detect is 138 copies/mL. A negative result does not preclude SARS-Cov-2 infection and should not be used as the sole basis for treatment or other patient management decisions. A negative result may occur with  improper specimen collection/handling, submission of specimen other than nasopharyngeal swab, presence of viral mutation(s) within the areas targeted by this assay, and inadequate number of viral copies(<138 copies/mL). A negative result must be combined with clinical observations, patient history, and epidemiological information. The expected result is  Negative.  Fact Sheet for Patients:  EntrepreneurPulse.com.au  Fact Sheet for Healthcare Providers:   IncredibleEmployment.be  This test is no t yet approved or cleared by the [place] FDA and  has been authorized for detection and/or diagnosis of SARS-CoV-2 by FDA under an Emergency Use Authorization (EUA). This EUA will remain  in effect (meaning this test can be used) for the duration of the COVID-19 declaration under Section 564(b)(1) of the Act, 21 U.S.C.section 360bbb-3(b)(1), unless the authorization is terminated  or revoked sooner.       Influenza A by PCR NEGATIVE NEGATIVE Final   Influenza B by PCR NEGATIVE NEGATIVE Final    Comment: (NOTE) The Xpert Xpress SARS-CoV-2/FLU/RSV plus assay is intended as an aid in the diagnosis of influenza from Nasopharyngeal swab specimens and should not be used as a sole basis for treatment. Nasal washings and aspirates are unacceptable for Xpert Xpress SARS-CoV-2/FLU/RSV testing.  Fact Sheet for Patients: EntrepreneurPulse.com.au  Fact Sheet for Healthcare Providers: IncredibleEmployment.be  This test is not yet approved or cleared by the [place] FDA and has been authorized for detection and/or diagnosis of SARS-CoV-2 by FDA under an Emergency Use Authorization (EUA). This EUA will remain in effect (meaning this test can be used) for the duration of the COVID-19 declaration under Section 564(b)(1) of the Act, 21 U.S.C. section 360bbb-3(b)(1), unless the authorization is terminated or revoked.  Performed at [facility], [place] [address], [place], [place] [address]      Medications:    allopurinol  200 mg Oral Daily   amLODipine  5 mg Oral Daily   atorvastatin  80 mg Oral Daily   insulin aspart  0-6 Units Subcutaneous Q4H   irbesartan  150 mg Oral Daily   Continuous Infusions:    LOS: 0 days   Charlynne Cousins  [facility]  02/05/2022, 7:01 AM

## 2022-02-05 NOTE — H&P (Signed)
History and Physical    Lauren [name] MEQ:[id number] DOB: 1960/12/25 DOA: 02/04/2022  PCP: Carlena Hurl, PA-C   Patient coming from: Home   Chief Complaint: Abdominal pain   HPI: Lauren [name] is a pleasant 61 y.o. 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  Patient reports that she was in her usual state of health when she had liver biopsy performed by IR but developed worsening pain approximately an hour after returning home.  She was using hydrocodone at home without any relief and pain became severe.  She describes pain in the right upper quadrant and right flank with radiation towards her right clavicle and shoulder.  Pain is worse with deep breath.  She denies cough, fever, or chills.   ED Course: Upon arrival to the ED, patient is found to be afebrile, saturating well on room air, and with stable blood pressure.  EKG features sinus rhythm.  CTA chest is negative for PE or other acute intrathoracic abnormality.  CT of the abdomen and pelvis notable for 8.3 cm intraparenchymal liver hematoma communicating with moderate subcapsular hematoma, as well as small hemoperitoneum without evidence for active extravasation.  CBC notable for leukocytosis 12,600 and hemoglobin 10.8.  INR was normal.  IR was consulted by the ED physician and the patient was treated with 2 doses of morphine.  Review of Systems:  All other systems reviewed and apart from HPI, are negative.  Past Medical History:  Diagnosis Date   Anemia    Complication of anesthesia    History of thyroid cancer    Hyperlipidemia    Hypertension    PONV (postoperative nausea and vomiting)    Thyroid disease     Past Surgical History:  Procedure Laterality Date   GASTRIC BYPASS     KNEE SURGERY Left 2017    Social History:   reports  that she quit smoking about 21 years ago. Her smoking use included e-cigarettes and cigarettes. She has a 27.50 pack-year smoking history. She has never used smokeless tobacco. She reports current alcohol use. She reports that she does not currently use drugs.  Allergies  Allergen Reactions   Oxycodone Itching    Family History  Problem Relation Age of Onset   Breast cancer Mother    Breast cancer Maternal Grandmother    Diabetes Mellitus II Paternal Grandfather    Throat cancer Paternal Grandfather    Stomach cancer Neg Hx    Esophageal cancer Neg Hx    Pancreatic cancer Neg Hx    Colon cancer Neg Hx      Prior to Admission medications   Medication Sig Start Date End Date Taking? Authorizing Provider  allopurinol (ZYLOPRIM) 100 MG tablet Take 2 tablets (200 mg total) by mouth daily. 01/09/22   Tysinger, Camelia Eng, PA-C  amLODipine-valsartan (EXFORGE) 5-160 MG tablet Take 1 tablet by mouth daily.    [provider]  atorvastatin (LIPITOR) 80 MG tablet TAKE 1 TABLET DAILY 02/01/22   Tysinger, Camelia Eng, PA-C  colchicine 0.6 MG tablet Take 1 tablet (0.6 mg total) by mouth 2 (two) times daily. Take 2 tablets at onset of gout pain.  Take an additional 1 tablet an hour later, if needed 09/15/21   Tysinger, Camelia Eng, PA-C  Continuous Blood Gluc Sensor (FREESTYLE LIBRE 2 SENSOR) MISC by Does not apply route.    [provider]  famotidine (PEPCID) 20 MG tablet Take 20 mg by mouth 2 (two) times daily as needed.    [provider]  HYDROcodone-acetaminophen (NORCO) 5-325 MG tablet Take 1 tablet by mouth every 6 (six) hours as needed. 12/23/21   Tysinger, Camelia Eng, PA-C  insulin glargine, 1 Unit Dial, (TOUJEO) 300 UNIT/ML Solostar Pen Inject 10 Units into the skin daily. 08/05/21   Tysinger, Camelia Eng, PA-C  Insulin Pen Needle (BD PEN NEEDLE NANO U/F) 32G X 4 MM MISC 1 each by Does not apply route at bedtime. 07/27/21   Tysinger, Camelia Eng, PA-C  LUMIGAN 0.01 % SOLN 1 drop at bedtime.  08/11/21   [provider]  Misc Natural Products (TART CHERRY ADVANCED PO) Take 1 capsule by mouth daily.    [provider]  Prenatal Vit-Fe Fumarate-FA (PRENATAL VITAMIN) 27-0.8 MG TABS Take 1 tablet by mouth daily. 08/04/21   Tysinger, Camelia Eng, PA-C  Suvorexant (BELSOMRA) 15 MG TABS Take 1 tablet by mouth daily. Patient taking differently: Take 1 tablet by mouth as needed. 09/15/21   Carlena Hurl, PA-C    Physical Exam: Vitals:   02/04/22 2300 02/04/22 2315 02/05/22 0030 02/05/22 0132  BP: (!) 163/90 (!) 155/95 129/71 (!) 147/91  Pulse: 75 73 73 91  Resp: 19 17 (!) 24 (!) 22  Temp:      TempSrc:      SpO2: 96% 95% 97% 98%  Weight:      Height:        Constitutional: NAD, calm  Eyes: PERTLA, lids and conjunctivae normal ENMT: Mucous membranes are moist. Posterior pharynx clear of any exudate or lesions.   Neck: supple, no masses  Respiratory: no wheezing, no crackles. No accessory muscle use.  Cardiovascular: S1 & S2 heard, regular rate and rhythm. No extremity edema.   Abdomen: No distension, soft, tender on right, no guarding. Bowel sounds active.  Musculoskeletal: no clubbing / cyanosis. No joint deformity upper and lower extremities.   Skin: no significant rashes, lesions, ulcers. Warm, dry, well-perfused. Neurologic: CN 2-12 grossly intact. Moving all extremities. Alert and oriented.  Psychiatric: Pleasant. Cooperative.    Labs and Imaging on Admission: I have personally reviewed following labs and imaging studies  CBC: Recent Labs  Lab 02/03/22 1205 02/04/22 2214  WBC 6.8 12.6*  NEUTROABS  --  9.2*  HGB 11.9* 10.8*  HCT 38.6 33.7*  MCV 86.9 84.5  PLT 394 676   Basic Metabolic Panel: Recent Labs  Lab 02/04/22 2214  NA 136  K 3.6  CL 102  CO2 25  GLUCOSE 141*  BUN 23*  CREATININE 1.53*  CALCIUM 9.3   GFR: Estimated Creatinine Clearance: 43 mL/min (A) (by C-G formula based on SCr of 1.53 mg/dL (H)). Liver Function Tests: Recent  Labs  Lab 02/04/22 2214  AST 96*  ALT 112*  ALKPHOS 144*  BILITOT 0.3  PROT 7.5  ALBUMIN 4.1   Recent Labs  Lab 02/04/22 2214  LIPASE 47   Recent Labs  Lab 02/04/22 2214  AMMONIA 20   Coagulation Profile: Recent Labs  Lab 02/03/22 1205 02/04/22 2214  INR 0.9 0.9   Cardiac Enzymes: No results for input(s): CKTOTAL, CKMB, CKMBINDEX, TROPONINI in the last 168 hours. BNP (last 3 results) No results for input(s): PROBNP in the last 8760 hours. HbA1C: No results for input(s): HGBA1C in the last 72 hours. CBG: Recent Labs  Lab 02/03/22 1144  GLUCAP 101*   Lipid Profile: No results for input(s): CHOL, HDL,  LDLCALC, TRIG, CHOLHDL, LDLDIRECT in the last 72 hours. Thyroid Function Tests: No results for input(s): TSH, T4TOTAL, FREET4, T3FREE, THYROIDAB in the last 72 hours. Anemia Panel: No results for input(s): VITAMINB12, FOLATE, FERRITIN, TIBC, IRON, RETICCTPCT in the last 72 hours. Urine analysis:    Component Value Date/Time   COLORURINE YELLOW 02/04/2022 0003   APPEARANCEUR CLEAR 02/04/2022 0003   LABSPEC 1.015 02/04/2022 0003   PHURINE 6.0 02/04/2022 0003   GLUCOSEU NEGATIVE 02/04/2022 0003   HGBUR NEGATIVE 02/04/2022 0003   BILIRUBINUR NEGATIVE 02/04/2022 0003   KETONESUR NEGATIVE 02/04/2022 0003   PROTEINUR >=300 (A) 02/04/2022 0003   NITRITE NEGATIVE 02/04/2022 0003   LEUKOCYTESUR NEGATIVE 02/04/2022 0003   Sepsis Labs: @LABRCNTIP (procalcitonin:4,lacticidven:4) )No results found for this or any previous visit (from the past 240 hour(s)).   Radiological Exams on Admission: CT Angio Chest PE W and/or Wo Contrast  Result Date: 02/05/2022 CLINICAL DATA:  Abdominal pain, post-op liver bx yesterday, now RUQ and back pain, CP, SOB; Pulmonary embolism (PE) suspected, high prob EXAM: CT ANGIOGRAPHY CHEST CT ABDOMEN AND PELVIS WITH CONTRAST TECHNIQUE: Multidetector CT imaging of the chest was performed using the standard protocol during bolus administration of  intravenous contrast. Multiplanar CT image reconstructions and MIPs were obtained to evaluate the vascular anatomy. Multidetector CT imaging of the abdomen and pelvis was performed using the standard protocol during bolus administration of intravenous contrast. RADIATION DOSE REDUCTION: This exam was performed according to the departmental dose-optimization program which includes automated exposure control, adjustment of the mA and/or kV according to patient size and/or use of iterative reconstruction technique. CONTRAST:  164mL OMNIPAQUE IOHEXOL 350 MG/ML SOLN COMPARISON:  None. FINDINGS: CTA CHEST FINDINGS Cardiovascular: Adequate opacification of the pulmonary arterial tree. No intraluminal filling defect identified to suggest acute pulmonary embolism. Central pulmonary arteries are of normal caliber. No significant coronary artery calcification. Cardiac size within normal limits. No pericardial effusion. Mild atherosclerotic calcification within the thoracic aorta. No aortic aneurysm. Mediastinum/Nodes: Status post left thyroidectomy. Residual right visualized thyroid is unremarkable. Multiple calcified mediastinal lymph nodes are seen in keeping with old granulomatous disease. No pathologic thoracic adenopathy. Esophagus is unremarkable. Lungs/Pleura: Lungs are clear. No pleural effusion or pneumothorax. Musculoskeletal: Degenerative changes seen within the lumbar spine. No lytic or blastic bone lesions are seen. No acute bone abnormality. Review of the MIP images confirms the above findings. CT ABDOMEN and PELVIS FINDINGS Hepatobiliary: 5.1 x 8.3 cm intraparenchymal hematoma has developed within segment 7 of the liver with extension posterolaterally resulting in a 2.5 x 11.0 x 9.7 cm subcapsular hematoma. No active extravasation identified. No intra or extrahepatic biliary ductal dilation. Portal vein and hepatic veins appear patent. Cholelithiasis without pericholecystic inflammatory change noted. Pancreas:  Unremarkable Spleen: Unremarkable Adrenals/Urinary Tract: The adrenal glands are unremarkable. The kidneys are normal in size and position. 4 mm nonobstructing calculus noted within the lower pole the right kidney. Simple cortical cyst noted within the lower pole the right kidney. The kidneys are otherwise unremarkable. Bladder is unremarkable. Stomach/Bowel: Small high attenuation free fluid is seen within the pelvis in keeping with mild hemoperitoneum. 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 The stomach, small bowel, and large bowel are otherwise unremarkable. Appendix normal. No free intraperitoneal gas. Vascular/Lymphatic: No significant vascular findings are present. No enlarged abdominal or pelvic lymph nodes. Reproductive: Uterus and bilateral adnexa are unremarkable. Other: Tiny fat containing umbilical hernia. Musculoskeletal: No acute bone abnormality. No lytic or blastic bone lesion. Degenerative changes are seen within the lumbar spine. Review  of the MIP images confirms the above findings. IMPRESSION: No pulmonary embolism.  No acute intrathoracic pathology identified. 8.3 cm intraparenchymal hematoma within segment 7 liver communicating with a moderate subcapsular hematoma. Small hemoperitoneum. No active extravasation identified. Mild nonobstructing right nephrolithiasis. Electronically Signed   By: Fidela Salisbury M.D.   On: 02/05/2022 00:47   CT ABDOMEN PELVIS W CONTRAST  Result Date: 02/05/2022 CLINICAL DATA:  Abdominal pain, post-op liver bx yesterday, now RUQ and back pain, CP, SOB; Pulmonary embolism (PE) suspected, high prob EXAM: CT ANGIOGRAPHY CHEST CT ABDOMEN AND PELVIS WITH CONTRAST TECHNIQUE: Multidetector CT imaging of the chest was performed using the standard protocol during bolus administration of intravenous contrast. Multiplanar CT image reconstructions and MIPs were obtained to evaluate the vascular anatomy. Multidetector CT imaging of the abdomen and pelvis was performed  using the standard protocol during bolus administration of intravenous contrast. RADIATION DOSE REDUCTION: This exam was performed according to the departmental dose-optimization program which includes automated exposure control, adjustment of the mA and/or kV according to patient size and/or use of iterative reconstruction technique. CONTRAST:  142mL OMNIPAQUE IOHEXOL 350 MG/ML SOLN COMPARISON:  None. FINDINGS: CTA CHEST FINDINGS Cardiovascular: Adequate opacification of the pulmonary arterial tree. No intraluminal filling defect identified to suggest acute pulmonary embolism. Central pulmonary arteries are of normal caliber. No significant coronary artery calcification. Cardiac size within normal limits. No pericardial effusion. Mild atherosclerotic calcification within the thoracic aorta. No aortic aneurysm. Mediastinum/Nodes: Status post left thyroidectomy. Residual right visualized thyroid is unremarkable. Multiple calcified mediastinal lymph nodes are seen in keeping with old granulomatous disease. No pathologic thoracic adenopathy. Esophagus is unremarkable. Lungs/Pleura: Lungs are clear. No pleural effusion or pneumothorax. Musculoskeletal: Degenerative changes seen within the lumbar spine. No lytic or blastic bone lesions are seen. No acute bone abnormality. Review of the MIP images confirms the above findings. CT ABDOMEN and PELVIS FINDINGS Hepatobiliary: 5.1 x 8.3 cm intraparenchymal hematoma has developed within segment 7 of the liver with extension posterolaterally resulting in a 2.5 x 11.0 x 9.7 cm subcapsular hematoma. No active extravasation identified. No intra or extrahepatic biliary ductal dilation. Portal vein and hepatic veins appear patent. Cholelithiasis without pericholecystic inflammatory change noted. Pancreas: Unremarkable Spleen: Unremarkable Adrenals/Urinary Tract: The adrenal glands are unremarkable. The kidneys are normal in size and position. 4 mm nonobstructing calculus noted within  the lower pole the right kidney. Simple cortical cyst noted within the lower pole the right kidney. The kidneys are otherwise unremarkable. Bladder is unremarkable. Stomach/Bowel: Small high attenuation free fluid is seen within the pelvis in keeping with mild hemoperitoneum. 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 The stomach, small bowel, and large bowel are otherwise unremarkable. Appendix normal. No free intraperitoneal gas. Vascular/Lymphatic: No significant vascular findings are present. No enlarged abdominal or pelvic lymph nodes. Reproductive: Uterus and bilateral adnexa are unremarkable. Other: Tiny fat containing umbilical hernia. Musculoskeletal: No acute bone abnormality. No lytic or blastic bone lesion. Degenerative changes are seen within the lumbar spine. Review of the MIP images confirms the above findings. IMPRESSION: No pulmonary embolism.  No acute intrathoracic pathology identified. 8.3 cm intraparenchymal hematoma within segment 7 liver communicating with a moderate subcapsular hematoma. Small hemoperitoneum. No active extravasation identified. Mild nonobstructing right nephrolithiasis. Electronically Signed   By: Fidela Salisbury M.D.   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 The benefits, risks, alternatives, and complications were also discussed. The patient understands and wishes to proceed with the procedure. A verbal as well as written consent was obtained. Survey ultrasound of the liver was performed and an appropriate skin entry site was determined. Skin site was marked, prepped with Betadine, and draped in usual sterile fashion, and infiltrated locally with 1% lidocaine. Intravenous Fentanyl 121mcg and Versed 2mg  were  administered as conscious sedation during continuous monitoring of the patient's level of consciousness and physiological / cardiorespiratory status by the radiology RN, with a total moderate sedation time of 10 minutes. A 17 gauge trocar needle was advanced under ultrasound guidance into the liver, right lobe anterior. 2 solid-appearing coaxial 18gauge core samples were then obtained through the guide needle. The guide needle was removed. Post procedure scans demonstrate no apparent complication. COMPLICATIONS: COMPLICATIONS None immediate FINDINGS: No focal liver lesion was identified. Several layering gallstones in the nondistended gallbladder incidentally noted. Representative core biopsy samples obtained as above. IMPRESSION: 1. Technically successful ultrasound guided core liver biopsy. 2. Cholelithiasis Electronically Signed   By: Lucrezia Europe M.D.   On: 02/03/2022 16:27    EKG: Independently reviewed. Sinus rhythm.   Assessment/Plan   1. Intraparenchymal liver hematoma; subcapsular hematoma   - Pt with suspected PBC presents with severe RUQ abdominal pain after liver biopsy on 02/03/22 and found to have intraparenchymal liver hematoma communicating with subcapsular hematoma and small amount of hemoperitoneum  - IR consulted by ED, recommended bed-rest and will see her in consultation   - Continue pain-control, bed-rest, repeat CBC in am, follow-up IR recommendations    2. IDDM  - A1c was 7.2% in October 2022  - CBG checks and SSI    3. Hypertension  - Continue amlodipine and valsartan    4. CKD IIIb  - SCr is 1.53 on admission, similar to priors  - Renally-dose medications  5. OSA  - CPAP qHS     DVT prophylaxis: SCDs  Code Status: Full  Level of Care: Level of care: Med-Surg Family Communication: Significant other at bedside  Disposition Plan:  Patient is from: home  Anticipated d/c is to: Home  Anticipated d/c date is:  2/27 or 02/07/22  Patient currently: Pending  pain-control, IR consultation  Consults called: IR  Admission status: Observation     Vianne Bulls, MD [facility]  02/05/2022, 2:43 AM

## 2022-02-05 NOTE — Progress Notes (Signed)
Referring Physician(s): Feliz Ortis, A.   Supervising Physician: Jacqulynn Cadet  Patient Status:  [facility] - In-pt  Chief Complaint:  Right flank and RLQ pain S/p random liver bx on 2/24, complcated by developing an intraparenchymal hematoma within segment 7 liver communicating with a moderate subcapsular hematoma.   Subjective:  Patient laying in bed, NAD.  Tolerating PO.  States that pain is controlled currently.  Denies other symptoms.   Allergies: Oxycodone  Medications: Prior to Admission medications   Medication Sig Start Date End Date Taking? Authorizing Provider  allopurinol (ZYLOPRIM) 100 MG tablet Take 2 tablets (200 mg total) by mouth daily. 01/09/22  Yes Tysinger, Camelia Eng, PA-C  amLODipine-valsartan (EXFORGE) 5-160 MG tablet Take 1 tablet by mouth daily.   Yes [provider]  atorvastatin (LIPITOR) 80 MG tablet TAKE 1 TABLET DAILY 02/01/22  Yes Tysinger, Camelia Eng, PA-C  colchicine 0.6 MG tablet Take 1 tablet (0.6 mg total) by mouth 2 (two) times daily. Take 2 tablets at onset of gout pain.  Take an additional 1 tablet an hour later, if needed Patient taking differently: Take 0.6-1.2 mg by mouth daily as needed (gout pain). Take 2 tablets at onset of gout pain.  Take an additional 1 tablet an hour later, if needed 09/15/21  Yes Tysinger, Camelia Eng, PA-C  famotidine (PEPCID) 20 MG tablet Take 20 mg by mouth 2 (two) times daily as needed for indigestion or heartburn.   Yes [provider]  HYDROcodone-acetaminophen (NORCO) 5-325 MG tablet Take 1 tablet by mouth every 6 (six) hours as needed. Patient taking differently: Take 1 tablet by mouth every 6 (six) hours as needed for moderate pain or severe pain. 12/23/21  Yes Tysinger, Camelia Eng, PA-C  insulin glargine, 1 Unit Dial, (TOUJEO) 300 UNIT/ML Solostar Pen Inject 10 Units into the skin daily. Patient taking differently: Inject 7 Units into the skin every evening. 08/05/21  Yes Tysinger, Camelia Eng, PA-C  LUMIGAN  0.01 % SOLN Place 1 drop into both eyes at bedtime. 08/11/21  Yes [provider]  Misc Natural Products (TART CHERRY ADVANCED PO) Take 1 capsule by mouth daily.   Yes [provider]  Prenatal Vit-Fe Fumarate-FA (PRENATAL VITAMIN) 27-0.8 MG TABS Take 1 tablet by mouth daily. 08/04/21  Yes Tysinger, Camelia Eng, PA-C  Suvorexant (BELSOMRA) 15 MG TABS Take 1 tablet by mouth daily. Patient taking differently: Take 1 tablet by mouth daily as needed (insomnia). 09/15/21  Yes Tysinger, Camelia Eng, PA-C  Continuous Blood Gluc Sensor (FREESTYLE LIBRE 2 SENSOR) MISC by Does not apply route.    [provider]  Insulin Pen Needle (BD PEN NEEDLE NANO U/F) 32G X 4 MM MISC 1 each by Does not apply route at bedtime. 07/27/21   Tysinger, Camelia Eng, PA-C     Vital Signs: BP 120/84 (BP Location: Right Arm)    Pulse 91    Temp 98.8 F (37.1 C) (Oral)    Resp 16    Ht 5\' 2"  (1.575 m)    Wt 218 lb (98.9 kg)    SpO2 93%    BMI 39.87 kg/m   Physical Exam Vitals reviewed.  Constitutional:      General: She is not in acute distress.    Appearance: She is well-developed. She is not ill-appearing.  HENT:     Head: Normocephalic and atraumatic.  Pulmonary:     Effort: Pulmonary effort is normal.  Abdominal:     Palpations: Abdomen is soft.  Comments: TTP on right flank area, R flank soft.  Skin:    General: Skin is warm and dry.     Coloration: Skin is not cyanotic or jaundiced.     Comments: No ecchymosis noted on right flank or RUQ  Neurological:     Mental Status: She is alert and oriented to person, place, and time.  Psychiatric:        Mood and Affect: Mood normal.        Behavior: Behavior normal.    Imaging: CT Angio Chest PE W and/or Wo Contrast  Result Date: 02/05/2022 CLINICAL DATA:  Abdominal pain, post-op liver bx yesterday, now RUQ and back pain, CP, SOB; Pulmonary embolism (PE) suspected, high prob EXAM: CT ANGIOGRAPHY CHEST CT ABDOMEN AND PELVIS WITH CONTRAST TECHNIQUE:  Multidetector CT imaging of the chest was performed using the standard protocol during bolus administration of intravenous contrast. Multiplanar CT image reconstructions and MIPs were obtained to evaluate the vascular anatomy. Multidetector CT imaging of the abdomen and pelvis was performed using the standard protocol during bolus administration of intravenous contrast. RADIATION DOSE REDUCTION: This exam was performed according to the departmental dose-optimization program which includes automated exposure control, adjustment of the mA and/or kV according to patient size and/or use of iterative reconstruction technique. CONTRAST:  191mL OMNIPAQUE IOHEXOL 350 MG/ML SOLN COMPARISON:  None. FINDINGS: CTA CHEST FINDINGS Cardiovascular: Adequate opacification of the pulmonary arterial tree. No intraluminal filling defect identified to suggest acute pulmonary embolism. Central pulmonary arteries are of normal caliber. No significant coronary artery calcification. Cardiac size within normal limits. No pericardial effusion. Mild atherosclerotic calcification within the thoracic aorta. No aortic aneurysm. Mediastinum/Nodes: Status post left thyroidectomy. Residual right visualized thyroid is unremarkable. Multiple calcified mediastinal lymph nodes are seen in keeping with old granulomatous disease. No pathologic thoracic adenopathy. Esophagus is unremarkable. Lungs/Pleura: Lungs are clear. No pleural effusion or pneumothorax. Musculoskeletal: Degenerative changes seen within the lumbar spine. No lytic or blastic bone lesions are seen. No acute bone abnormality. Review of the MIP images confirms the above findings. CT ABDOMEN and PELVIS FINDINGS Hepatobiliary: 5.1 x 8.3 cm intraparenchymal hematoma has developed within segment 7 of the liver with extension posterolaterally resulting in a 2.5 x 11.0 x 9.7 cm subcapsular hematoma. No active extravasation identified. No intra or extrahepatic biliary ductal dilation. Portal vein  and hepatic veins appear patent. Cholelithiasis without pericholecystic inflammatory change noted. Pancreas: Unremarkable Spleen: Unremarkable Adrenals/Urinary Tract: The adrenal glands are unremarkable. The kidneys are normal in size and position. 4 mm nonobstructing calculus noted within the lower pole the right kidney. Simple cortical cyst noted within the lower pole the right kidney. The kidneys are otherwise unremarkable. Bladder is unremarkable. Stomach/Bowel: Small high attenuation free fluid is seen within the pelvis in keeping with mild hemoperitoneum. 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 The stomach, small bowel, and large bowel are otherwise unremarkable. Appendix normal. No free intraperitoneal gas. Vascular/Lymphatic: No significant vascular findings are present. No enlarged abdominal or pelvic lymph nodes. Reproductive: Uterus and bilateral adnexa are unremarkable. Other: Tiny fat containing umbilical hernia. Musculoskeletal: No acute bone abnormality. No lytic or blastic bone lesion. Degenerative changes are seen within the lumbar spine. Review of the MIP images confirms the above findings. IMPRESSION: No pulmonary embolism.  No acute intrathoracic pathology identified. 8.3 cm intraparenchymal hematoma within segment 7 liver communicating with a moderate subcapsular hematoma. Small hemoperitoneum. No active extravasation identified. Mild nonobstructing right nephrolithiasis. Electronically Signed   By: Linwood Dibbles.D.  On: 02/05/2022 00:47   CT ABDOMEN PELVIS W CONTRAST  Result Date: 02/05/2022 CLINICAL DATA:  Abdominal pain, post-op liver bx yesterday, now RUQ and back pain, CP, SOB; Pulmonary embolism (PE) suspected, high prob EXAM: CT ANGIOGRAPHY CHEST CT ABDOMEN AND PELVIS WITH CONTRAST TECHNIQUE: Multidetector CT imaging of the chest was performed using the standard protocol during bolus administration of intravenous contrast. Multiplanar CT image reconstructions and MIPs were  obtained to evaluate the vascular anatomy. Multidetector CT imaging of the abdomen and pelvis was performed using the standard protocol during bolus administration of intravenous contrast. RADIATION DOSE REDUCTION: This exam was performed according to the departmental dose-optimization program which includes automated exposure control, adjustment of the mA and/or kV according to patient size and/or use of iterative reconstruction technique. CONTRAST:  175mL OMNIPAQUE IOHEXOL 350 MG/ML SOLN COMPARISON:  None. FINDINGS: CTA CHEST FINDINGS Cardiovascular: Adequate opacification of the pulmonary arterial tree. No intraluminal filling defect identified to suggest acute pulmonary embolism. Central pulmonary arteries are of normal caliber. No significant coronary artery calcification. Cardiac size within normal limits. No pericardial effusion. Mild atherosclerotic calcification within the thoracic aorta. No aortic aneurysm. Mediastinum/Nodes: Status post left thyroidectomy. Residual right visualized thyroid is unremarkable. Multiple calcified mediastinal lymph nodes are seen in keeping with old granulomatous disease. No pathologic thoracic adenopathy. Esophagus is unremarkable. Lungs/Pleura: Lungs are clear. No pleural effusion or pneumothorax. Musculoskeletal: Degenerative changes seen within the lumbar spine. No lytic or blastic bone lesions are seen. No acute bone abnormality. Review of the MIP images confirms the above findings. CT ABDOMEN and PELVIS FINDINGS Hepatobiliary: 5.1 x 8.3 cm intraparenchymal hematoma has developed within segment 7 of the liver with extension posterolaterally resulting in a 2.5 x 11.0 x 9.7 cm subcapsular hematoma. No active extravasation identified. No intra or extrahepatic biliary ductal dilation. Portal vein and hepatic veins appear patent. Cholelithiasis without pericholecystic inflammatory change noted. Pancreas: Unremarkable Spleen: Unremarkable Adrenals/Urinary Tract: The adrenal  glands are unremarkable. The kidneys are normal in size and position. 4 mm nonobstructing calculus noted within the lower pole the right kidney. Simple cortical cyst noted within the lower pole the right kidney. The kidneys are otherwise unremarkable. Bladder is unremarkable. Stomach/Bowel: Small high attenuation free fluid is seen within the pelvis in keeping with mild hemoperitoneum. 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 The stomach, small bowel, and large bowel are otherwise unremarkable. Appendix normal. No free intraperitoneal gas. Vascular/Lymphatic: No significant vascular findings are present. No enlarged abdominal or pelvic lymph nodes. Reproductive: Uterus and bilateral adnexa are unremarkable. Other: Tiny fat containing umbilical hernia. Musculoskeletal: No acute bone abnormality. No lytic or blastic bone lesion. Degenerative changes are seen within the lumbar spine. Review of the MIP images confirms the above findings. IMPRESSION: No pulmonary embolism.  No acute intrathoracic pathology identified. 8.3 cm intraparenchymal hematoma within segment 7 liver communicating with a moderate subcapsular hematoma. Small hemoperitoneum. No active extravasation identified. Mild nonobstructing right nephrolithiasis. Electronically Signed   By: Fidela Salisbury M.D.   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 The benefits, risks, alternatives, and complications were also discussed. The patient understands and wishes to proceed with the procedure. A verbal as well as written consent was obtained. Survey ultrasound of the liver was performed and an appropriate skin entry site was determined. Skin  site was marked, prepped with Betadine, and draped in  usual sterile fashion, and infiltrated locally with 1% lidocaine. Intravenous Fentanyl 154mcg and Versed 2mg  were administered as conscious sedation during continuous monitoring of the patient's level of consciousness and physiological / cardiorespiratory status by the radiology RN, with a total moderate sedation time of 10 minutes. A 17 gauge trocar needle was advanced under ultrasound guidance into the liver, right lobe anterior. 2 solid-appearing coaxial 18gauge core samples were then obtained through the guide needle. The guide needle was removed. Post procedure scans demonstrate no apparent complication. COMPLICATIONS: COMPLICATIONS None immediate FINDINGS: No focal liver lesion was identified. Several layering gallstones in the nondistended gallbladder incidentally noted. Representative core biopsy samples obtained as above. IMPRESSION: 1. Technically successful ultrasound guided core liver biopsy. 2. Cholelithiasis Electronically Signed   By: Lucrezia Europe M.D.   On: 02/03/2022 16:27    Labs:  CBC: Recent Labs    09/12/21 1211 02/03/22 1205 02/04/22 2214 02/05/22 0426  WBC 8.9 6.8 12.6* 12.6*  HGB 11.7 11.9* 10.8* 10.3*  HCT 36.5 38.6 33.7* 32.1*  PLT 427 394 344 325    COAGS: Recent Labs    01/12/22 1058 02/03/22 1205 02/04/22 2214  INR 0.8 0.9 0.9    BMP: Recent Labs    07/13/21 1215 09/12/21 1211 02/04/22 2214 02/05/22 0426  NA 144 141 136 136  K 4.1 3.9 3.6 4.2  CL 102 104 102 100  CO2 25 23 25 28   GLUCOSE 124* 138* 141* 143*  BUN 26* 17 23* 21*  CALCIUM 10.6* 9.8 9.3 9.2  CREATININE 1.84* 1.47* 1.53* 1.58*  GFRNONAA  --   --  39* 37*    LIVER FUNCTION TESTS: Recent Labs    09/12/21 1211 01/12/22 1058 02/04/22 2214 02/05/22 0426  BILITOT <0.2 0.4 0.3 0.5  AST 25 25 96* 87*  ALT 41* 21 112* 111*  ALKPHOS 252* 167* 144* 133*  PROT 7.2 8.1 7.5 7.3  ALBUMIN 4.6 4.6 4.1 3.9    Assessment and Plan:  61 y.o. female s/p random liver bx on 2/24, presented  to ED on 2/26 due to severe  pain, CT showed an intraparenchymal hematoma within segment 7 liver communicating with a moderate subcapsular hematoma.  Patient is hemodynamically stable, hospitalized for pain control.   VSS Hgb stable 10.3 this morning (11.9 pre procedure)  LFTs stable  Pain controlled with PRN IV Dilaudid q3h   Further treatment plan per [facility] Appreciate and agree with the plan.  IR to follow.    Electronically Signed: Tera Mater, PA-C 02/05/2022, 10:11 AM   I spent a total of 25 Minutes at the the patient's bedside AND on the patient's hospital floor or unit, greater than 50% of which was counseling/coordinating care for intraparenchymal hepatic hematoma after random liver bx on 2/24.   This chart was dictated using voice recognition software.  Despite best efforts to proofread,  errors can occur which can change the documentation meaning.

## 2022-02-06 DIAGNOSIS — S36112A Contusion of liver, initial encounter: Secondary | ICD-10-CM | POA: Diagnosis present

## 2022-02-06 DIAGNOSIS — D72829 Elevated white blood cell count, unspecified: Secondary | ICD-10-CM | POA: Diagnosis present

## 2022-02-06 DIAGNOSIS — G4733 Obstructive sleep apnea (adult) (pediatric): Secondary | ICD-10-CM | POA: Diagnosis present

## 2022-02-06 DIAGNOSIS — Z87891 Personal history of nicotine dependence: Secondary | ICD-10-CM | POA: Diagnosis not present

## 2022-02-06 DIAGNOSIS — Z6839 Body mass index (BMI) 39.0-39.9, adult: Secondary | ICD-10-CM | POA: Diagnosis not present

## 2022-02-06 DIAGNOSIS — R112 Nausea with vomiting, unspecified: Secondary | ICD-10-CM | POA: Diagnosis not present

## 2022-02-06 DIAGNOSIS — K743 Primary biliary cirrhosis: Secondary | ICD-10-CM | POA: Diagnosis present

## 2022-02-06 DIAGNOSIS — Y848 Other medical procedures as the cause of abnormal reaction of the patient, or of later complication, without mention of misadventure at the time of the procedure: Secondary | ICD-10-CM | POA: Diagnosis present

## 2022-02-06 DIAGNOSIS — K661 Hemoperitoneum: Secondary | ICD-10-CM | POA: Diagnosis present

## 2022-02-06 DIAGNOSIS — I129 Hypertensive chronic kidney disease with stage 1 through stage 4 chronic kidney disease, or unspecified chronic kidney disease: Secondary | ICD-10-CM | POA: Diagnosis present

## 2022-02-06 DIAGNOSIS — R0781 Pleurodynia: Secondary | ICD-10-CM

## 2022-02-06 DIAGNOSIS — Z794 Long term (current) use of insulin: Secondary | ICD-10-CM | POA: Diagnosis not present

## 2022-02-06 DIAGNOSIS — Z85850 Personal history of malignant neoplasm of thyroid: Secondary | ICD-10-CM | POA: Diagnosis not present

## 2022-02-06 DIAGNOSIS — I10 Essential (primary) hypertension: Secondary | ICD-10-CM | POA: Diagnosis not present

## 2022-02-06 DIAGNOSIS — Z885 Allergy status to narcotic agent status: Secondary | ICD-10-CM | POA: Diagnosis not present

## 2022-02-06 DIAGNOSIS — K760 Fatty (change of) liver, not elsewhere classified: Secondary | ICD-10-CM | POA: Diagnosis present

## 2022-02-06 DIAGNOSIS — Z833 Family history of diabetes mellitus: Secondary | ICD-10-CM | POA: Diagnosis not present

## 2022-02-06 DIAGNOSIS — Z20822 Contact with and (suspected) exposure to covid-19: Secondary | ICD-10-CM | POA: Diagnosis present

## 2022-02-06 DIAGNOSIS — E1122 Type 2 diabetes mellitus with diabetic chronic kidney disease: Secondary | ICD-10-CM | POA: Diagnosis present

## 2022-02-06 DIAGNOSIS — E785 Hyperlipidemia, unspecified: Secondary | ICD-10-CM | POA: Diagnosis present

## 2022-02-06 DIAGNOSIS — R0602 Shortness of breath: Secondary | ICD-10-CM | POA: Diagnosis present

## 2022-02-06 DIAGNOSIS — Z79899 Other long term (current) drug therapy: Secondary | ICD-10-CM | POA: Diagnosis not present

## 2022-02-06 DIAGNOSIS — N1832 Chronic kidney disease, stage 3b: Secondary | ICD-10-CM | POA: Diagnosis present

## 2022-02-06 DIAGNOSIS — K91870 Postprocedural hematoma of a digestive system organ or structure following a digestive system procedure: Secondary | ICD-10-CM | POA: Diagnosis present

## 2022-02-06 DIAGNOSIS — S72009A Fracture of unspecified part of neck of unspecified femur, initial encounter for closed fracture: Secondary | ICD-10-CM | POA: Diagnosis present

## 2022-02-06 LAB — CBC WITH DIFFERENTIAL/PLATELET
Abs Immature Granulocytes: 0.05 10*3/uL (ref 0.00–0.07)
Basophils Absolute: 0 10*3/uL (ref 0.0–0.1)
Basophils Relative: 0 %
Eosinophils Absolute: 0.3 10*3/uL (ref 0.0–0.5)
Eosinophils Relative: 4 %
HCT: 32 % — ABNORMAL LOW (ref 36.0–46.0)
Hemoglobin: 10 g/dL — ABNORMAL LOW (ref 12.0–15.0)
Immature Granulocytes: 1 %
Lymphocytes Relative: 29 %
Lymphs Abs: 2.9 10*3/uL (ref 0.7–4.0)
MCH: 27.4 pg (ref 26.0–34.0)
MCHC: 31.3 g/dL (ref 30.0–36.0)
MCV: 87.7 fL (ref 80.0–100.0)
Monocytes Absolute: 0.9 10*3/uL (ref 0.1–1.0)
Monocytes Relative: 9 %
Neutro Abs: 5.6 10*3/uL (ref 1.7–7.7)
Neutrophils Relative %: 57 %
Platelets: 344 10*3/uL (ref 150–400)
RBC: 3.65 MIL/uL — ABNORMAL LOW (ref 3.87–5.11)
RDW: 15.8 % — ABNORMAL HIGH (ref 11.5–15.5)
WBC: 9.8 10*3/uL (ref 4.0–10.5)
nRBC: 0 % (ref 0.0–0.2)

## 2022-02-06 LAB — URINE CULTURE: Culture: <10000 — AB

## 2022-02-06 LAB — GLUCOSE, CAPILLARY
Glucose-Capillary: 119 mg/dL — ABNORMAL HIGH (ref 70–99)
Glucose-Capillary: 120 mg/dL — ABNORMAL HIGH (ref 70–99)
Glucose-Capillary: 122 mg/dL — ABNORMAL HIGH (ref 70–99)
Glucose-Capillary: 141 mg/dL — ABNORMAL HIGH (ref 70–99)
Glucose-Capillary: 141 mg/dL — ABNORMAL HIGH (ref 70–99)
Glucose-Capillary: 158 mg/dL — ABNORMAL HIGH (ref 70–99)
Glucose-Capillary: 169 mg/dL — ABNORMAL HIGH (ref 70–99)

## 2022-02-06 LAB — SURGICAL PATHOLOGY

## 2022-02-06 MED ORDER — HYDROCODONE-ACETAMINOPHEN 10-325 MG PO TABS
2.0000 | ORAL_TABLET | ORAL | Status: DC | PRN
  Administered 2022-02-06 – 2022-02-07 (×4): 2 via ORAL
  Filled 2022-02-06 (×4): qty 2

## 2022-02-06 NOTE — Progress Notes (Signed)
°  Transition of Care [facility]) Screening Note   Patient Details  Name: [name] Date of Birth: Nov 24, 1961   Transition of Care (TOC) CM/SW Contact:    Lennart Pall, LCSW Phone Number: 02/06/2022, 11:00 AM    Transition of Care Department [facility]) has reviewed patient and no TOC needs have been identified at this time. We will continue to monitor patient advancement through interdisciplinary progression rounds. If new patient transition needs arise, please place a TOC consult.

## 2022-02-06 NOTE — Plan of Care (Signed)
  Problem: Pain Managment: Goal: General experience of comfort will improve Outcome: Progressing   Problem: Safety: Goal: Ability to remain free from injury will improve Outcome: Progressing   

## 2022-02-06 NOTE — Progress Notes (Signed)
[facility] PROGRESS NOTE    Progress Note  Lauren [name]  EZM:[id number] DOB: [birth year] DOA: 02/04/2022 PCP: Carlena Hurl, PA-C     Brief Narrative:   Lauren [name] is an 61 y.o. 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 IR consulted recommended conservative management bedrest narcotics and IV fluids. Hemoglobin is pending this morning Tmax of 99.6. She has had no appetite is minimal, started on MiraLAX p.o. twice daily. Give her regular diet  Insulin-dependent diabetes mellitus type 2: With an A1c of 7.2, continue sliding scale insulin.  Essential hypertension: Continue valsartan and amlodipine.  Chronic kidney disease stage IIIb: Her creatinine appears to be at baseline.  Obstructive sleep apnea: Continue CPAP at night.  Morbid obesity: With a BMI of 39, she has been counseled.   DVT prophylaxis: scd Family Communication:husband Status is: Observation The patient remains OBS appropriate and will d/c before 2 midnights.  Pain is not controlled some nausea          Code Status:     Code Status Orders  (From admission, onward)           Start     Ordered   02/05/22 0243  Full code  Continuous        02/05/22 0243           Code Status History     This patient has a  current code status but no historical code status.         IV Access:   Peripheral IV   Procedures and diagnostic studies:   CT Angio Chest PE W and/or Wo Contrast  Result Date: 02/05/2022 CLINICAL DATA:  Abdominal pain, post-op liver bx yesterday, now RUQ and back pain, CP, SOB; Pulmonary embolism (PE) suspected, high prob EXAM: CT ANGIOGRAPHY CHEST CT ABDOMEN AND PELVIS WITH CONTRAST TECHNIQUE: Multidetector CT imaging of the chest was performed using the standard protocol during bolus administration of intravenous contrast. Multiplanar CT image reconstructions and MIPs were obtained to evaluate the vascular anatomy. Multidetector CT imaging of the abdomen and pelvis was performed using the standard protocol during bolus administration of intravenous contrast. RADIATION DOSE REDUCTION: This exam was performed according to the departmental dose-optimization program which includes automated exposure control, adjustment of the mA and/or kV according to patient size and/or use of iterative reconstruction technique. CONTRAST:  143mL OMNIPAQUE IOHEXOL 350 MG/ML SOLN COMPARISON:  None. FINDINGS: CTA CHEST FINDINGS Cardiovascular: Adequate opacification of the pulmonary arterial tree. No intraluminal filling defect identified to suggest acute pulmonary embolism. Central pulmonary arteries are of normal caliber. No significant coronary artery calcification. Cardiac size within normal limits. No pericardial effusion. Mild atherosclerotic calcification within the thoracic aorta. No aortic aneurysm. Mediastinum/Nodes: Status post left thyroidectomy. Residual right visualized thyroid is unremarkable. Multiple calcified mediastinal lymph nodes are seen in keeping with old granulomatous disease. No pathologic thoracic adenopathy. Esophagus is unremarkable. Lungs/Pleura: Lungs are clear. No pleural effusion or pneumothorax. Musculoskeletal: Degenerative changes  seen within the lumbar spine. No lytic or blastic  bone lesions are seen. No acute bone abnormality. Review of the MIP images confirms the above findings. CT ABDOMEN and PELVIS FINDINGS Hepatobiliary: 5.1 x 8.3 cm intraparenchymal hematoma has developed within segment 7 of the liver with extension posterolaterally resulting in a 2.5 x 11.0 x 9.7 cm subcapsular hematoma. No active extravasation identified. No intra or extrahepatic biliary ductal dilation. Portal vein and hepatic veins appear patent. Cholelithiasis without pericholecystic inflammatory change noted. Pancreas: Unremarkable Spleen: Unremarkable Adrenals/Urinary Tract: The adrenal glands are unremarkable. The kidneys are normal in size and position. 4 mm nonobstructing calculus noted within the lower pole the right kidney. Simple cortical cyst noted within the lower pole the right kidney. The kidneys are otherwise unremarkable. Bladder is unremarkable. Stomach/Bowel: Small high attenuation free fluid is seen within the pelvis in keeping with mild hemoperitoneum. 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 The stomach, small bowel, and large bowel are otherwise unremarkable. Appendix normal. No free intraperitoneal gas. Vascular/Lymphatic: No significant vascular findings are present. No enlarged abdominal or pelvic lymph nodes. Reproductive: Uterus and bilateral adnexa are unremarkable. Other: Tiny fat containing umbilical hernia. Musculoskeletal: No acute bone abnormality. No lytic or blastic bone lesion. Degenerative changes are seen within the lumbar spine. Review of the MIP images confirms the above findings. IMPRESSION: No pulmonary embolism.  No acute intrathoracic pathology identified. 8.3 cm intraparenchymal hematoma within segment 7 liver communicating with a moderate subcapsular hematoma. Small hemoperitoneum. No active extravasation identified. Mild nonobstructing right nephrolithiasis. Electronically Signed   By: Fidela Salisbury M.D.   On: 02/05/2022 00:47   CT ABDOMEN PELVIS W  CONTRAST  Result Date: 02/05/2022 CLINICAL DATA:  Abdominal pain, post-op liver bx yesterday, now RUQ and back pain, CP, SOB; Pulmonary embolism (PE) suspected, high prob EXAM: CT ANGIOGRAPHY CHEST CT ABDOMEN AND PELVIS WITH CONTRAST TECHNIQUE: Multidetector CT imaging of the chest was performed using the standard protocol during bolus administration of intravenous contrast. Multiplanar CT image reconstructions and MIPs were obtained to evaluate the vascular anatomy. Multidetector CT imaging of the abdomen and pelvis was performed using the standard protocol during bolus administration of intravenous contrast. RADIATION DOSE REDUCTION: This exam was performed according to the departmental dose-optimization program which includes automated exposure control, adjustment of the mA and/or kV according to patient size and/or use of iterative reconstruction technique. CONTRAST:  136mL OMNIPAQUE IOHEXOL 350 MG/ML SOLN COMPARISON:  None. FINDINGS: CTA CHEST FINDINGS Cardiovascular: Adequate opacification of the pulmonary arterial tree. No intraluminal filling defect identified to suggest acute pulmonary embolism. Central pulmonary arteries are of normal caliber. No significant coronary artery calcification. Cardiac size within normal limits. No pericardial effusion. Mild atherosclerotic calcification within the thoracic aorta. No aortic aneurysm. Mediastinum/Nodes: Status post left thyroidectomy. Residual right visualized thyroid is unremarkable. Multiple calcified mediastinal lymph nodes are seen in keeping with old granulomatous disease. No pathologic thoracic adenopathy. Esophagus is unremarkable. Lungs/Pleura: Lungs are clear. No pleural effusion or pneumothorax. Musculoskeletal: Degenerative changes seen within the lumbar spine. No lytic or blastic bone lesions are seen. No acute bone abnormality. Review of the MIP images confirms the above findings. CT ABDOMEN and PELVIS FINDINGS Hepatobiliary: 5.1 x 8.3 cm  intraparenchymal hematoma has developed within segment 7 of the liver with extension posterolaterally resulting in a 2.5 x 11.0 x 9.7 cm subcapsular hematoma. No active extravasation identified. No intra or extrahepatic biliary ductal dilation. Portal vein and hepatic veins appear patent. Cholelithiasis without pericholecystic inflammatory change noted. Pancreas: Unremarkable Spleen:  Unremarkable Adrenals/Urinary Tract: The adrenal glands are unremarkable. The kidneys are normal in size and position. 4 mm nonobstructing calculus noted within the lower pole the right kidney. Simple cortical cyst noted within the lower pole the right kidney. The kidneys are otherwise unremarkable. Bladder is unremarkable. Stomach/Bowel: Small high attenuation free fluid is seen within the pelvis in keeping with mild hemoperitoneum. 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 The stomach, small bowel, and large bowel are otherwise unremarkable. Appendix normal. No free intraperitoneal gas. Vascular/Lymphatic: No significant vascular findings are present. No enlarged abdominal or pelvic lymph nodes. Reproductive: Uterus and bilateral adnexa are unremarkable. Other: Tiny fat containing umbilical hernia. Musculoskeletal: No acute bone abnormality. No lytic or blastic bone lesion. Degenerative changes are seen within the lumbar spine. Review of the MIP images confirms the above findings. IMPRESSION: No pulmonary embolism.  No acute intrathoracic pathology identified. 8.3 cm intraparenchymal hematoma within segment 7 liver communicating with a moderate subcapsular hematoma. Small hemoperitoneum. No active extravasation identified. Mild nonobstructing right nephrolithiasis. Electronically Signed   By: Fidela Salisbury M.D.   On: 02/05/2022 00:47     Medical Consultants:   None.   Subjective:    Lauren [name] relates her oxycodone caused itching has not had much of an appetite.  Objective:    Vitals:   02/05/22 1350 02/05/22 1840  02/05/22 2126 02/06/22 0530  BP: 123/78 118/69 118/68 133/88  Pulse: 85 93 81 91  Resp: 16 20 18 18   Temp: 99.5 F (37.5 C) 99.9 F (37.7 C) 99.2 F (37.3 C) 99.6 F (37.6 C)  TempSrc: Oral Oral Oral Oral  SpO2: 93% 92% 95% 95%  Weight:      Height:       SpO2: 95 %   Intake/Output Summary (Last 24 hours) at 02/06/2022 0759 Last data filed at 02/06/2022 0600 Gross per 24 hour  Intake 1680 ml  Output 200 ml  Net 1480 ml    Filed Weights   02/04/22 2120  Weight: 98.9 kg    Exam: General exam: In no acute distress. Respiratory system: Good air movement and clear to auscultation. Cardiovascular system: S1 & S2 heard, RRR. No JVD. Gastrointestinal system: Abdomen is nondistended, soft and nontender.  Extremities: No pedal edema. Skin: No rashes, lesions or ulcers Psychiatry: Judgement and insight appear normal. Mood & affect appropriate.   Data Reviewed:    Labs: Basic Metabolic Panel: Recent Labs  Lab 02/04/22 2214 02/05/22 0426  NA 136 136  K 3.6 4.2  CL 102 100  CO2 25 28  GLUCOSE 141* 143*  BUN 23* 21*  CREATININE 1.53* 1.58*  CALCIUM 9.3 9.2    GFR Estimated Creatinine Clearance: 41.6 mL/min (A) (by C-G formula based on SCr of 1.58 mg/dL (H)). Liver Function Tests: Recent Labs  Lab 02/04/22 2214 02/05/22 0426  AST 96* 87*  ALT 112* 111*  ALKPHOS 144* 133*  BILITOT 0.3 0.5  PROT 7.5 7.3  ALBUMIN 4.1 3.9    Recent Labs  Lab 02/04/22 2214  LIPASE 47    Recent Labs  Lab 02/04/22 2214  AMMONIA 20    Coagulation profile Recent Labs  Lab 02/03/22 1205 02/04/22 2214  INR 0.9 0.9    COVID-19 Labs  No results for input(s): DDIMER, FERRITIN, LDH, CRP in the last 72 hours.  Lab Results  Component Value Date   [place] NEGATIVE 02/05/2022    CBC: Recent Labs  Lab 02/03/22 1205 02/04/22 2214 02/05/22 0426  WBC 6.8 12.6* 12.6*  NEUTROABS  --  9.2*  --   HGB 11.9* 10.8* 10.3*  HCT 38.6 33.7* 32.1*  MCV 86.9 84.5 85.4   PLT 394 344 325    Cardiac Enzymes: No results for input(s): CKTOTAL, CKMB, CKMBINDEX, TROPONINI in the last 168 hours. BNP (last 3 results) No results for input(s): PROBNP in the last 8760 hours. CBG: Recent Labs  Lab 02/05/22 1720 02/05/22 1944 02/06/22 0027 02/06/22 0332 02/06/22 0750  GLUCAP 132* 163* 120* 122* 158*    D-Dimer: No results for input(s): DDIMER in the last 72 hours. Hgb A1c: No results for input(s): HGBA1C in the last 72 hours. Lipid Profile: No results for input(s): CHOL, HDL, LDLCALC, TRIG, CHOLHDL, LDLDIRECT in the last 72 hours. Thyroid function studies: No results for input(s): TSH, T4TOTAL, T3FREE, THYROIDAB in the last 72 hours.  Invalid input(s): FREET3 Anemia work up: No results for input(s): VITAMINB12, FOLATE, FERRITIN, TIBC, IRON, RETICCTPCT in the last 72 hours. Sepsis Labs: Recent Labs  Lab 02/03/22 1205 02/04/22 2214 02/04/22 2358 02/05/22 0426  WBC 6.8 12.6*  --  12.6*  LATICACIDVEN  --  1.3 1.7  --     Microbiology Recent Results (from the past 240 hour(s))  Resp Panel by RT-PCR (Flu A&B, Covid) Nasopharyngeal Swab     Status: None   Collection Time: 02/05/22  1:50 AM   Specimen: Nasopharyngeal Swab; Nasopharyngeal(NP) swabs in vial transport medium  Result Value Ref Range Status   SARS Coronavirus 2 by RT PCR NEGATIVE NEGATIVE Final    Comment: (NOTE) SARS-CoV-2 target nucleic acids are NOT DETECTED.  The SARS-CoV-2 RNA is generally detectable in upper respiratory specimens during the acute phase of infection. The lowest concentration of SARS-CoV-2 viral copies this assay can detect is 138 copies/mL. A negative result does not preclude SARS-Cov-2 infection and should not be used as the sole basis for treatment or other patient management decisions. A negative result may occur with  improper specimen collection/handling, submission of specimen other than nasopharyngeal swab, presence of viral mutation(s) within the areas  targeted by this assay, and inadequate number of viral copies(<138 copies/mL). A negative result must be combined with clinical observations, patient history, and epidemiological information. The expected result is Negative.  Fact Sheet for Patients:  EntrepreneurPulse.com.au  Fact Sheet for Healthcare Providers:  IncredibleEmployment.be  This test is no t yet approved or cleared by the [place] FDA and  has been authorized for detection and/or diagnosis of SARS-CoV-2 by FDA under an Emergency Use Authorization (EUA). This EUA will remain  in effect (meaning this test can be used) for the duration of the COVID-19 declaration under Section 564(b)(1) of the Act, 21 U.S.C.section 360bbb-3(b)(1), unless the authorization is terminated  or revoked sooner.       Influenza A by PCR NEGATIVE NEGATIVE Final   Influenza B by PCR NEGATIVE NEGATIVE Final    Comment: (NOTE) The Xpert Xpress SARS-CoV-2/FLU/RSV plus assay is intended as an aid in the diagnosis of influenza from Nasopharyngeal swab specimens and should not be used as a sole basis for treatment. Nasal washings and aspirates are unacceptable for Xpert Xpress SARS-CoV-2/FLU/RSV testing.  Fact Sheet for Patients: EntrepreneurPulse.com.au  Fact Sheet for Healthcare Providers: IncredibleEmployment.be  This test is not yet approved or cleared by the [place] FDA and has been authorized for detection and/or diagnosis of SARS-CoV-2 by FDA under an Emergency Use Authorization (EUA). This EUA will remain in effect (meaning this test can be used) for the duration of the COVID-19 declaration under Section 564(b)(1)  of the Act, 21 U.S.C. section 360bbb-3(b)(1), unless the authorization is terminated or revoked.  Performed at [facility], [place] [address], [place], [place] [address]      Medications:    allopurinol  200 mg Oral  Daily   amLODipine  5 mg Oral Daily   atorvastatin  80 mg Oral Daily   insulin aspart  0-6 Units Subcutaneous Q4H   irbesartan  150 mg Oral Daily   Continuous Infusions:    LOS: 0 days   Charlynne Cousins  [facility]  02/06/2022, 7:59 AM

## 2022-02-06 NOTE — Progress Notes (Signed)
Patient ID: Lauren [name], female   DOB: January 04, 1961, 61 y.o.   MRN: [id number]    Referring Physician(s): Sandi Mariscal  Supervising Physician: Ruthann Cancer  Patient Status:  [facility] - In-pt  Chief Complaint: Right flank and RLQ pain S/p random liver bx on 2/24, complcated by developing an intraparenchymal hematoma within segment 7 liver communicating with a moderate subcapsular hematoma   Subjective: Pt doing a little better today; still has intermittent RUQ/lat abd discomfort, exacerbated with deep breathing, occ rt shoulder pain- all likely from diaphragmatic irritation from recent bleed; no N/V at present; occ HA; spouse in room   Allergies: Oxycodone  Medications: Prior to Admission medications   Medication Sig Start Date End Date Taking? Authorizing Provider  allopurinol (ZYLOPRIM) 100 MG tablet Take 2 tablets (200 mg total) by mouth daily. 01/09/22  Yes Tysinger, Camelia Eng, PA-C  amLODipine-valsartan (EXFORGE) 5-160 MG tablet Take 1 tablet by mouth daily.   Yes [provider]  atorvastatin (LIPITOR) 80 MG tablet TAKE 1 TABLET DAILY 02/01/22  Yes Tysinger, Camelia Eng, PA-C  colchicine 0.6 MG tablet Take 1 tablet (0.6 mg total) by mouth 2 (two) times daily. Take 2 tablets at onset of gout pain.  Take an additional 1 tablet an hour later, if needed Patient taking differently: Take 0.6-1.2 mg by mouth daily as needed (gout pain). Take 2 tablets at onset of gout pain.  Take an additional 1 tablet an hour later, if needed 09/15/21  Yes Tysinger, Camelia Eng, PA-C  famotidine (PEPCID) 20 MG tablet Take 20 mg by mouth 2 (two) times daily as needed for indigestion or heartburn.   Yes [provider]  HYDROcodone-acetaminophen (NORCO) 5-325 MG tablet Take 1 tablet by mouth every 6 (six) hours as needed. Patient taking differently: Take 1 tablet by mouth every 6 (six) hours as needed for moderate pain or severe pain. 12/23/21  Yes Tysinger, Camelia Eng, PA-C  insulin glargine, 1 Unit Dial,  (TOUJEO) 300 UNIT/ML Solostar Pen Inject 10 Units into the skin daily. Patient taking differently: Inject 7 Units into the skin every evening. 08/05/21  Yes Tysinger, Camelia Eng, PA-C  LUMIGAN 0.01 % SOLN Place 1 drop into both eyes at bedtime. 08/11/21  Yes [provider]  Misc Natural Products (TART CHERRY ADVANCED PO) Take 1 capsule by mouth daily.   Yes [provider]  Prenatal Vit-Fe Fumarate-FA (PRENATAL VITAMIN) 27-0.8 MG TABS Take 1 tablet by mouth daily. 08/04/21  Yes Tysinger, Camelia Eng, PA-C  Suvorexant (BELSOMRA) 15 MG TABS Take 1 tablet by mouth daily. Patient taking differently: Take 1 tablet by mouth daily as needed (insomnia). 09/15/21  Yes Tysinger, Camelia Eng, PA-C  Continuous Blood Gluc Sensor (FREESTYLE LIBRE 2 SENSOR) MISC by Does not apply route.    [provider]  Insulin Pen Needle (BD PEN NEEDLE NANO U/F) 32G X 4 MM MISC 1 each by Does not apply route at bedtime. 07/27/21   Tysinger, Camelia Eng, PA-C     Vital Signs: BP 133/88 (BP Location: Right Arm)    Pulse 91    Temp 99.6 F (37.6 C) (Oral)    Resp 18    Ht 5\' 2"  (1.575 m)    Wt 218 lb (98.9 kg)    SpO2 95%    BMI 39.87 kg/m   Physical Exam: awake/alert; abd soft, no ecchymosis, mildly tender RUQ/lat abd regions   Imaging: CT Angio Chest PE W and/or Wo Contrast  Result Date: 02/05/2022 CLINICAL DATA:  Abdominal pain,  post-op liver bx yesterday, now RUQ and back pain, CP, SOB; Pulmonary embolism (PE) suspected, high prob EXAM: CT ANGIOGRAPHY CHEST CT ABDOMEN AND PELVIS WITH CONTRAST TECHNIQUE: Multidetector CT imaging of the chest was performed using the standard protocol during bolus administration of intravenous contrast. Multiplanar CT image reconstructions and MIPs were obtained to evaluate the vascular anatomy. Multidetector CT imaging of the abdomen and pelvis was performed using the standard protocol during bolus administration of intravenous contrast. RADIATION DOSE REDUCTION: This exam was  performed according to the departmental dose-optimization program which includes automated exposure control, adjustment of the mA and/or kV according to patient size and/or use of iterative reconstruction technique. CONTRAST:  113mL OMNIPAQUE IOHEXOL 350 MG/ML SOLN COMPARISON:  None. FINDINGS: CTA CHEST FINDINGS Cardiovascular: Adequate opacification of the pulmonary arterial tree. No intraluminal filling defect identified to suggest acute pulmonary embolism. Central pulmonary arteries are of normal caliber. No significant coronary artery calcification. Cardiac size within normal limits. No pericardial effusion. Mild atherosclerotic calcification within the thoracic aorta. No aortic aneurysm. Mediastinum/Nodes: Status post left thyroidectomy. Residual right visualized thyroid is unremarkable. Multiple calcified mediastinal lymph nodes are seen in keeping with old granulomatous disease. No pathologic thoracic adenopathy. Esophagus is unremarkable. Lungs/Pleura: Lungs are clear. No pleural effusion or pneumothorax. Musculoskeletal: Degenerative changes seen within the lumbar spine. No lytic or blastic bone lesions are seen. No acute bone abnormality. Review of the MIP images confirms the above findings. CT ABDOMEN and PELVIS FINDINGS Hepatobiliary: 5.1 x 8.3 cm intraparenchymal hematoma has developed within segment 7 of the liver with extension posterolaterally resulting in a 2.5 x 11.0 x 9.7 cm subcapsular hematoma. No active extravasation identified. No intra or extrahepatic biliary ductal dilation. Portal vein and hepatic veins appear patent. Cholelithiasis without pericholecystic inflammatory change noted. Pancreas: Unremarkable Spleen: Unremarkable Adrenals/Urinary Tract: The adrenal glands are unremarkable. The kidneys are normal in size and position. 4 mm nonobstructing calculus noted within the lower pole the right kidney. Simple cortical cyst noted within the lower pole the right kidney. The kidneys are  otherwise unremarkable. Bladder is unremarkable. Stomach/Bowel: Small high attenuation free fluid is seen within the pelvis in keeping with mild hemoperitoneum. 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 The stomach, small bowel, and large bowel are otherwise unremarkable. Appendix normal. No free intraperitoneal gas. Vascular/Lymphatic: No significant vascular findings are present. No enlarged abdominal or pelvic lymph nodes. Reproductive: Uterus and bilateral adnexa are unremarkable. Other: Tiny fat containing umbilical hernia. Musculoskeletal: No acute bone abnormality. No lytic or blastic bone lesion. Degenerative changes are seen within the lumbar spine. Review of the MIP images confirms the above findings. IMPRESSION: No pulmonary embolism.  No acute intrathoracic pathology identified. 8.3 cm intraparenchymal hematoma within segment 7 liver communicating with a moderate subcapsular hematoma. Small hemoperitoneum. No active extravasation identified. Mild nonobstructing right nephrolithiasis. Electronically Signed   By: Fidela Salisbury M.D.   On: 02/05/2022 00:47   CT ABDOMEN PELVIS W CONTRAST  Result Date: 02/05/2022 CLINICAL DATA:  Abdominal pain, post-op liver bx yesterday, now RUQ and back pain, CP, SOB; Pulmonary embolism (PE) suspected, high prob EXAM: CT ANGIOGRAPHY CHEST CT ABDOMEN AND PELVIS WITH CONTRAST TECHNIQUE: Multidetector CT imaging of the chest was performed using the standard protocol during bolus administration of intravenous contrast. Multiplanar CT image reconstructions and MIPs were obtained to evaluate the vascular anatomy. Multidetector CT imaging of the abdomen and pelvis was performed using the standard protocol during bolus administration of intravenous contrast. RADIATION DOSE REDUCTION: This exam was performed according to  the departmental dose-optimization program which includes automated exposure control, adjustment of the mA and/or kV according to patient size and/or use of  iterative reconstruction technique. CONTRAST:  155mL OMNIPAQUE IOHEXOL 350 MG/ML SOLN COMPARISON:  None. FINDINGS: CTA CHEST FINDINGS Cardiovascular: Adequate opacification of the pulmonary arterial tree. No intraluminal filling defect identified to suggest acute pulmonary embolism. Central pulmonary arteries are of normal caliber. No significant coronary artery calcification. Cardiac size within normal limits. No pericardial effusion. Mild atherosclerotic calcification within the thoracic aorta. No aortic aneurysm. Mediastinum/Nodes: Status post left thyroidectomy. Residual right visualized thyroid is unremarkable. Multiple calcified mediastinal lymph nodes are seen in keeping with old granulomatous disease. No pathologic thoracic adenopathy. Esophagus is unremarkable. Lungs/Pleura: Lungs are clear. No pleural effusion or pneumothorax. Musculoskeletal: Degenerative changes seen within the lumbar spine. No lytic or blastic bone lesions are seen. No acute bone abnormality. Review of the MIP images confirms the above findings. CT ABDOMEN and PELVIS FINDINGS Hepatobiliary: 5.1 x 8.3 cm intraparenchymal hematoma has developed within segment 7 of the liver with extension posterolaterally resulting in a 2.5 x 11.0 x 9.7 cm subcapsular hematoma. No active extravasation identified. No intra or extrahepatic biliary ductal dilation. Portal vein and hepatic veins appear patent. Cholelithiasis without pericholecystic inflammatory change noted. Pancreas: Unremarkable Spleen: Unremarkable Adrenals/Urinary Tract: The adrenal glands are unremarkable. The kidneys are normal in size and position. 4 mm nonobstructing calculus noted within the lower pole the right kidney. Simple cortical cyst noted within the lower pole the right kidney. The kidneys are otherwise unremarkable. Bladder is unremarkable. Stomach/Bowel: Small high attenuation free fluid is seen within the pelvis in keeping with mild hemoperitoneum. Gastric sleeve resection  has been performed. The stomach, small bowel, and large bowel are otherwise unremarkable. Appendix normal. No free intraperitoneal gas. Vascular/Lymphatic: No significant vascular findings are present. No enlarged abdominal or pelvic lymph nodes. Reproductive: Uterus and bilateral adnexa are unremarkable. Other: Tiny fat containing umbilical hernia. Musculoskeletal: No acute bone abnormality. No lytic or blastic bone lesion. Degenerative changes are seen within the lumbar spine. Review of the MIP images confirms the above findings. IMPRESSION: No pulmonary embolism.  No acute intrathoracic pathology identified. 8.3 cm intraparenchymal hematoma within segment 7 liver communicating with a moderate subcapsular hematoma. Small hemoperitoneum. No active extravasation identified. Mild nonobstructing right nephrolithiasis. Electronically Signed   By: Fidela Salisbury M.D.   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 The benefits, risks, alternatives, and complications were also discussed. The patient understands and wishes to proceed with the procedure. A verbal as well as written consent was obtained. Survey ultrasound of the liver was performed and an appropriate skin entry site was determined. Skin site was marked, prepped with Betadine, and draped in usual sterile fashion, and infiltrated locally with 1% lidocaine. Intravenous Fentanyl 114mcg and Versed 2mg  were administered as conscious sedation during continuous monitoring of the patient's level of consciousness and physiological / cardiorespiratory status by the radiology RN, with a total moderate sedation time of 10 minutes. A 17 gauge trocar needle was advanced under ultrasound guidance into the liver,  right lobe anterior. 2 solid-appearing coaxial 18gauge core samples were then obtained through the guide needle. The guide needle was removed. Post procedure scans demonstrate no apparent complication. COMPLICATIONS: COMPLICATIONS None immediate FINDINGS: No focal liver lesion  was identified. Several layering gallstones in the nondistended gallbladder incidentally noted. Representative core biopsy samples obtained as above. IMPRESSION: 1. Technically successful ultrasound guided core liver biopsy. 2. Cholelithiasis Electronically Signed   By: Lucrezia Europe M.D.   On: 02/03/2022 16:27    Labs:  CBC: Recent Labs    02/03/22 1205 02/04/22 2214 02/05/22 0426 02/06/22 0824  WBC 6.8 12.6* 12.6* 9.8  HGB 11.9* 10.8* 10.3* 10.0*  HCT 38.6 33.7* 32.1* 32.0*  PLT 394 344 325 344    COAGS: Recent Labs    01/12/22 1058 02/03/22 1205 02/04/22 2214  INR 0.8 0.9 0.9    BMP: Recent Labs    07/13/21 1215 09/12/21 1211 02/04/22 2214 02/05/22 0426  NA 144 141 136 136  K 4.1 3.9 3.6 4.2  CL 102 104 102 100  CO2 25 23 25 28   GLUCOSE 124* 138* 141* 143*  BUN 26* 17 23* 21*  CALCIUM 10.6* 9.8 9.3 9.2  CREATININE 1.84* 1.47* 1.53* 1.58*  GFRNONAA  --   --  39* 37*    LIVER FUNCTION TESTS: Recent Labs    09/12/21 1211 01/12/22 1058 02/04/22 2214 02/05/22 0426  BILITOT <0.2 0.4 0.3 0.5  AST 25 25 96* 87*  ALT 41* 21 112* 111*  ALKPHOS 252* 167* 144* 133*  PROT 7.2 8.1 7.5 7.3  ALBUMIN 4.6 4.6 4.1 3.9    Assessment and Plan: 61 y.o. female s/p random liver bx on 2/24, presented to ED on 2/26 due to severe  pain, CT showed an intraparenchymal hematoma within segment 7 liver communicating with a moderate subcapsular hematoma; temp 99.6; WBC 9.8, hgb 10(10.3); path pending; recent imaging studies were reviewed by Dr. Serafina Royals- he recommends continued supportive care with H/H checks for 24 more hours.    Electronically Signed: D. Rowe Robert, PA-C 02/06/2022, 10:32 AM   I spent a  total of 15 Minutes at the the patient's bedside AND on the patient's hospital floor or unit, greater than 50% of which was counseling/coordinating care for intraparenchymal hepatic hematoma after random liver bx on 02/03/22.

## 2022-02-07 DIAGNOSIS — S36112A Contusion of liver, initial encounter: Secondary | ICD-10-CM | POA: Diagnosis not present

## 2022-02-07 LAB — CBC
HCT: 31.1 % — ABNORMAL LOW (ref 36.0–46.0)
Hemoglobin: 10 g/dL — ABNORMAL LOW (ref 12.0–15.0)
MCH: 27.5 pg (ref 26.0–34.0)
MCHC: 32.2 g/dL (ref 30.0–36.0)
MCV: 85.7 fL (ref 80.0–100.0)
Platelets: 303 10*3/uL (ref 150–400)
RBC: 3.63 MIL/uL — ABNORMAL LOW (ref 3.87–5.11)
RDW: 15.6 % — ABNORMAL HIGH (ref 11.5–15.5)
WBC: 9.8 10*3/uL (ref 4.0–10.5)
nRBC: 0 % (ref 0.0–0.2)

## 2022-02-07 LAB — GLUCOSE, CAPILLARY
Glucose-Capillary: 107 mg/dL — ABNORMAL HIGH (ref 70–99)
Glucose-Capillary: 109 mg/dL — ABNORMAL HIGH (ref 70–99)
Glucose-Capillary: 119 mg/dL — ABNORMAL HIGH (ref 70–99)

## 2022-02-07 MED ORDER — HYDROCODONE-ACETAMINOPHEN 5-325 MG PO TABS: 1.0000 | ORAL_TABLET | Freq: Four times a day (QID) | ORAL | 0 refills | Status: DC | PRN

## 2022-02-07 MED ORDER — HYDROXYZINE HCL 10 MG PO TABS: 10.0000 mg | ORAL_TABLET | Freq: Three times a day (TID) | ORAL | 0 refills | Status: DC | PRN

## 2022-02-07 MED ORDER — POLYETHYLENE GLYCOL 3350 17 G PO PACK: 17.0000 g | PACK | Freq: Every day | ORAL | 0 refills | Status: DC

## 2022-02-07 MED ORDER — HYDROCODONE-ACETAMINOPHEN 10-325 MG PO TABS: 2.0000 | ORAL_TABLET | ORAL | 0 refills | Status: AC | PRN

## 2022-02-07 MED ORDER — POLYETHYLENE GLYCOL 3350 17 G PO PACK
17.0000 g | PACK | Freq: Two times a day (BID) | ORAL | Status: DC
  Administered 2022-02-07: 17 g via ORAL
  Filled 2022-02-07: qty 1

## 2022-02-07 MED ORDER — HYDROCODONE-ACETAMINOPHEN 10-325 MG PO TABS: 2.0000 | ORAL_TABLET | ORAL | 0 refills | Status: DC | PRN

## 2022-02-07 NOTE — Discharge Instructions (Signed)
Lauren [name] was admitted to the Hospital on 02/04/2022 and Discharged on Discharge Date 02/07/2022 and should be excused from work/school   for 15  days starting 02/04/2022 , may return to work/school without any restrictions.  Call Bess Harvest MD, [facility] [phone] with questions.  Charlynne Cousins M.D on 02/07/2022,at 10:13 AM  [facility] Office  [phone]

## 2022-02-07 NOTE — Plan of Care (Signed)
  Problem: Pain Managment: Goal: General experience of comfort will improve Outcome: Progressing   Problem: Safety: Goal: Ability to remain free from injury will improve Outcome: Progressing   

## 2022-02-07 NOTE — Discharge Summary (Signed)
Physician Discharge Summary  Lauren [name] WFU:[id number] DOB: 1960/12/26 DOA: 02/04/2022  PCP: Carlena Hurl, PA-C  Admit date: 02/04/2022 Discharge date: 02/07/2022  Admitted From: Home Disposition:  Home  Recommendations for Outpatient Follow-up:  Follow up with PCP in 1 weeks Please obtain CBC in one week to follow-up on her hemoglobin   Home Health:No  Equipment/Devices:none  Discharge Condition:Stable CODE STATUS:Full Diet recommendation: Heart Healthy   Brief/Interim Summary: 61 y.o. 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 who presents with severe abdominal pain after undergoing liver biopsy on 02/03/2022, CT scan of the abdomen pelvis was notable for 8.3 cm intraparenchymal liver hematoma communicating with a moderate subscapular hematoma as well as hemoperitoneum without evidence of active extravasation, CBC showed leukocytosis of 12 hemoglobin of 10 normal INR  Discharge Diagnoses:  Principal Problem:   Liver hematoma Active Problems:   Essential hypertension, benign   Stage 3b chronic kidney disease ([place])   Insulin-requiring or dependent type II diabetes mellitus (HCC)   OSA (obstructive sleep apnea)   Hip fracture (HCC)  Intraparenchymal liver hematoma associated with scapular hematoma and hemoperitoneum: Likely due to liver biopsy performed on 02/03/2022 CT scan of the abdomen pelvis was done that showed liver hematoma with communicating subscapular hematoma and small amount of hemoperitoneum. IR was curb sided who recommended conservative management with narcotics IV fluids and monitor hemoglobin. She remained afebrile she was started on MiraLAX as she was using narcotics to regular bowel movements. Her hemoglobin remained stable at 10, she will follow-up with her primary care doctor in 1 week and check a hemoglobin at that time.  Insulin-dependent diabetes mellitus  type 2: With an A1c of 7.2 no changes were made to her medication follow-up with PCP.  Essential hypertension: Continue valsartan and amlodipine well-controlled.  Chronic kidney stage IIIb: Her creatinine appears to be at baseline.  Obstructive sleep apnea: Continue CPAP at night.  Morbid obesity: With a BMI of 39 she has been using it in house.  Discharge Instructions  Discharge Instructions     Diet - low sodium heart healthy   Complete by: As directed    Increase activity slowly   Complete by: As directed       Allergies as of 02/07/2022       Reactions   Oxycodone Itching        Medication List     TAKE these medications    allopurinol 100 MG tablet Commonly known as: ZYLOPRIM Take 2 tablets (200 mg total) by mouth daily.   amLODipine-valsartan 5-160 MG tablet Commonly known as: EXFORGE Take 1 tablet by mouth daily.   atorvastatin 80 MG tablet Commonly known as: LIPITOR TAKE 1 TABLET DAILY   BD Pen Needle Nano U/F 32G X 4 MM Misc Generic drug: Insulin Pen Needle 1 each by Does not apply route at bedtime.   Belsomra 15 MG Tabs Generic drug: Suvorexant Take 1 tablet by mouth daily. What changed:  when to take this reasons to take this   colchicine 0.6 MG tablet Take 1 tablet (0.6 mg total) by mouth 2 (two) times daily. Take 2 tablets at onset of gout pain.  Take an additional 1 tablet an hour later, if needed What changed:  how much to take when to take this reasons to take this   famotidine 20 MG tablet Commonly known as: PEPCID Take 20 mg by mouth 2 (two) times daily as needed for indigestion or heartburn.  FreeStyle Libre 2 Sensor Misc by Does not apply route.   HYDROcodone-acetaminophen 10-325 MG tablet Commonly known as: NORCO Take 2 tablets by mouth every 4 (four) hours as needed for up to 5 days for moderate pain. What changed: You were already taking a medication with the same name, and this prescription was added. Make sure you  understand how and when to take each.   HYDROcodone-acetaminophen 5-325 MG tablet Commonly known as: Norco Take 1 tablet by mouth every 6 (six) hours as needed. Start taking on: February 12, 2022 What changed: These instructions start on February 12, 2022. If you are unsure what to do until then, ask your doctor or other care provider.   hydrOXYzine 10 MG tablet Commonly known as: ATARAX Take 1 tablet (10 mg total) by mouth 3 (three) times daily as needed for itching.   insulin glargine (1 Unit Dial) 300 UNIT/ML Solostar Pen Commonly known as: TOUJEO Inject 10 Units into the skin daily. What changed:  how much to take when to take this   Lumigan 0.01 % Soln Generic drug: bimatoprost Place 1 drop into both eyes at bedtime.   polyethylene glycol 17 g packet Commonly known as: MIRALAX / GLYCOLAX Take 17 g by mouth daily.   Prenatal Vitamin 27-0.8 MG Tabs Take 1 tablet by mouth daily.   TART CHERRY ADVANCED PO Take 1 capsule by mouth daily.        Allergies  Allergen Reactions   Oxycodone Itching    Consultations: None   Procedures/Studies: CT Angio Chest PE W and/or Wo Contrast  Result Date: 02/05/2022 CLINICAL DATA:  Abdominal pain, post-op liver bx yesterday, now RUQ and back pain, CP, SOB; Pulmonary embolism (PE) suspected, high prob EXAM: CT ANGIOGRAPHY CHEST CT ABDOMEN AND PELVIS WITH CONTRAST TECHNIQUE: Multidetector CT imaging of the chest was performed using the standard protocol during bolus administration of intravenous contrast. Multiplanar CT image reconstructions and MIPs were obtained to evaluate the vascular anatomy. Multidetector CT imaging of the abdomen and pelvis was performed using the standard protocol during bolus administration of intravenous contrast. RADIATION DOSE REDUCTION: This exam was performed according to the departmental dose-optimization program which includes automated exposure control, adjustment of the mA and/or kV according to patient size  and/or use of iterative reconstruction technique. CONTRAST:  125mL OMNIPAQUE IOHEXOL 350 MG/ML SOLN COMPARISON:  None. FINDINGS: CTA CHEST FINDINGS Cardiovascular: Adequate opacification of the pulmonary arterial tree. No intraluminal filling defect identified to suggest acute pulmonary embolism. Central pulmonary arteries are of normal caliber. No significant coronary artery calcification. Cardiac size within normal limits. No pericardial effusion. Mild atherosclerotic calcification within the thoracic aorta. No aortic aneurysm. Mediastinum/Nodes: Status post left thyroidectomy. Residual right visualized thyroid is unremarkable. Multiple calcified mediastinal lymph nodes are seen in keeping with old granulomatous disease. No pathologic thoracic adenopathy. Esophagus is unremarkable. Lungs/Pleura: Lungs are clear. No pleural effusion or pneumothorax. Musculoskeletal: Degenerative changes seen within the lumbar spine. No lytic or blastic bone lesions are seen. No acute bone abnormality. Review of the MIP images confirms the above findings. CT ABDOMEN and PELVIS FINDINGS Hepatobiliary: 5.1 x 8.3 cm intraparenchymal hematoma has developed within segment 7 of the liver with extension posterolaterally resulting in a 2.5 x 11.0 x 9.7 cm subcapsular hematoma. No active extravasation identified. No intra or extrahepatic biliary ductal dilation. Portal vein and hepatic veins appear patent. Cholelithiasis without pericholecystic inflammatory change noted. Pancreas: Unremarkable Spleen: Unremarkable Adrenals/Urinary Tract: The adrenal glands are unremarkable. The kidneys are normal in size  and position. 4 mm nonobstructing calculus noted within the lower pole the right kidney. Simple cortical cyst noted within the lower pole the right kidney. The kidneys are otherwise unremarkable. Bladder is unremarkable. Stomach/Bowel: Small high attenuation free fluid is seen within the pelvis in keeping with mild hemoperitoneum. Gastric  sleeve resection has been performed. The stomach, small bowel, and large bowel are otherwise unremarkable. Appendix normal. No free intraperitoneal gas. Vascular/Lymphatic: No significant vascular findings are present. No enlarged abdominal or pelvic lymph nodes. Reproductive: Uterus and bilateral adnexa are unremarkable. Other: Tiny fat containing umbilical hernia. Musculoskeletal: No acute bone abnormality. No lytic or blastic bone lesion. Degenerative changes are seen within the lumbar spine. Review of the MIP images confirms the above findings. IMPRESSION: No pulmonary embolism.  No acute intrathoracic pathology identified. 8.3 cm intraparenchymal hematoma within segment 7 liver communicating with a moderate subcapsular hematoma. Small hemoperitoneum. No active extravasation identified. Mild nonobstructing right nephrolithiasis. Electronically Signed   By: Fidela Salisbury M.D.   On: 02/05/2022 00:47   CT ABDOMEN PELVIS W CONTRAST  Result Date: 02/05/2022 CLINICAL DATA:  Abdominal pain, post-op liver bx yesterday, now RUQ and back pain, CP, SOB; Pulmonary embolism (PE) suspected, high prob EXAM: CT ANGIOGRAPHY CHEST CT ABDOMEN AND PELVIS WITH CONTRAST TECHNIQUE: Multidetector CT imaging of the chest was performed using the standard protocol during bolus administration of intravenous contrast. Multiplanar CT image reconstructions and MIPs were obtained to evaluate the vascular anatomy. Multidetector CT imaging of the abdomen and pelvis was performed using the standard protocol during bolus administration of intravenous contrast. RADIATION DOSE REDUCTION: This exam was performed according to the departmental dose-optimization program which includes automated exposure control, adjustment of the mA and/or kV according to patient size and/or use of iterative reconstruction technique. CONTRAST:  139mL OMNIPAQUE IOHEXOL 350 MG/ML SOLN COMPARISON:  None. FINDINGS: CTA CHEST FINDINGS Cardiovascular: Adequate  opacification of the pulmonary arterial tree. No intraluminal filling defect identified to suggest acute pulmonary embolism. Central pulmonary arteries are of normal caliber. No significant coronary artery calcification. Cardiac size within normal limits. No pericardial effusion. Mild atherosclerotic calcification within the thoracic aorta. No aortic aneurysm. Mediastinum/Nodes: Status post left thyroidectomy. Residual right visualized thyroid is unremarkable. Multiple calcified mediastinal lymph nodes are seen in keeping with old granulomatous disease. No pathologic thoracic adenopathy. Esophagus is unremarkable. Lungs/Pleura: Lungs are clear. No pleural effusion or pneumothorax. Musculoskeletal: Degenerative changes seen within the lumbar spine. No lytic or blastic bone lesions are seen. No acute bone abnormality. Review of the MIP images confirms the above findings. CT ABDOMEN and PELVIS FINDINGS Hepatobiliary: 5.1 x 8.3 cm intraparenchymal hematoma has developed within segment 7 of the liver with extension posterolaterally resulting in a 2.5 x 11.0 x 9.7 cm subcapsular hematoma. No active extravasation identified. No intra or extrahepatic biliary ductal dilation. Portal vein and hepatic veins appear patent. Cholelithiasis without pericholecystic inflammatory change noted. Pancreas: Unremarkable Spleen: Unremarkable Adrenals/Urinary Tract: The adrenal glands are unremarkable. The kidneys are normal in size and position. 4 mm nonobstructing calculus noted within the lower pole the right kidney. Simple cortical cyst noted within the lower pole the right kidney. The kidneys are otherwise unremarkable. Bladder is unremarkable. Stomach/Bowel: Small high attenuation free fluid is seen within the pelvis in keeping with mild hemoperitoneum. 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 The stomach, small bowel, and large bowel are otherwise unremarkable. Appendix normal. No free intraperitoneal gas. Vascular/Lymphatic: No  significant vascular findings are present. No enlarged abdominal or pelvic lymph nodes. Reproductive: Uterus  and bilateral adnexa are unremarkable. Other: Tiny fat containing umbilical hernia. Musculoskeletal: No acute bone abnormality. No lytic or blastic bone lesion. Degenerative changes are seen within the lumbar spine. Review of the MIP images confirms the above findings. IMPRESSION: No pulmonary embolism.  No acute intrathoracic pathology identified. 8.3 cm intraparenchymal hematoma within segment 7 liver communicating with a moderate subcapsular hematoma. Small hemoperitoneum. No active extravasation identified. Mild nonobstructing right nephrolithiasis. Electronically Signed   By: Fidela Salisbury M.D.   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 The benefits, risks, alternatives, and complications were also discussed. The patient understands and wishes to proceed with the procedure. A verbal as well as written consent was obtained. Survey ultrasound of the liver was performed and an appropriate skin entry site was determined. Skin site was marked, prepped with Betadine, and draped in usual sterile fashion, and infiltrated locally with 1% lidocaine. Intravenous Fentanyl 178mcg and Versed 2mg  were administered as conscious sedation during continuous monitoring of the patient's level of consciousness and physiological / cardiorespiratory status by the radiology RN, with a total moderate sedation time of 10 minutes. A 17 gauge trocar needle was advanced under ultrasound guidance into the liver, right lobe anterior. 2 solid-appearing coaxial 18gauge core samples were then obtained through the guide needle. The guide needle was removed. Post procedure  scans demonstrate no apparent complication. COMPLICATIONS: COMPLICATIONS None immediate FINDINGS: No focal liver lesion was identified. Several layering gallstones in the nondistended gallbladder incidentally noted. Representative core biopsy samples obtained as above. IMPRESSION: 1. Technically successful ultrasound guided core liver biopsy. 2. Cholelithiasis Electronically Signed   By: Lucrezia Europe M.D.   On: 02/03/2022 16:27   DG Bone Density  Result Date: 01/27/2022 Table formatting from the original result was not included. Date of study: 01/24/2022 Exam: DUAL X-RAY ABSORPTIOMETRY (DXA) FOR BONE MINERAL DENSITY (BMD) Instrument: [employer] Requesting Provider: Dr. Havery Moros Indication: screening for osteoporosis in patient with primary biliary cholangitis Comparison: none (please note that it is not possible to compare data from different instruments) Clinical data: Pt is a 61 y.o. female with reported previous fractures of elbow, foot.  On calcium and vitamin D. Results:  Lumbar spine L1-L3 (L4) Femoral neck (FN) 33% distal radius T-score  +2.9 RFN: +1.6 LFN: +1.4 n/a Assessment: the BMD is normal according to the [facility] classification for osteoporosis (see below). Fracture risk: low FRAX score: not calculated due to normal BMD Comments: the technical quality of the study is good, however, L4 vertebra had to be excluded from analysis due to degenerative changes. Recommend optimizing calcium (1200 mg/day) and vitamin D (800 IU/day) intake. No pharmacological treatment is indicated. Followup: Repeat BMD is appropriate after 2 years. WHO criteria for diagnosis of osteoporosis in postmenopausal women and in men 35 y/o or older: - normal: T-score -1.0 to + 1.0 - osteopenia/low bone density: T-score between -2.5 and -1.0 - osteoporosis: T-score below -2.5 - severe osteoporosis: T-score below -2.5 with history of fragility fracture Note: although not part of the WHO classification, the presence of a fragility  fracture, regardless of the T-score, should be considered diagnostic of osteoporosis, provided other causes for the fracture have been excluded. Philemon Kingdom, MD [facility] ]    Subjective: No complaints  Discharge Exam: Vitals:   02/06/22  2055 02/07/22 0515  BP: 118/73 118/74  Pulse: 80 80  Resp: 17 17  Temp: 98.2 F (36.8 C) 99.1 F (37.3 C)  SpO2: 97% 95%   Vitals:   02/06/22 0530 02/06/22 1326 02/06/22 2055 02/07/22 0515  BP: 133/88 107/73 118/73 118/74  Pulse: 91 89 80 80  Resp: 18 16 17 17   Temp: 99.6 F (37.6 C) 98.6 F (37 C) 98.2 F (36.8 C) 99.1 F (37.3 C)  TempSrc: Oral Oral Oral Oral  SpO2: 95% 90% 97% 95%  Weight:      Height:        General: Pt is alert, awake, not in acute distress Cardiovascular: RRR, S1/S2 +, no rubs, no gallops Respiratory: CTA bilaterally, no wheezing, no rhonchi Abdominal: Soft, NT, ND, bowel sounds + Extremities: no edema, no cyanosis    The results of significant diagnostics from this hospitalization (including imaging, microbiology, ancillary and laboratory) are listed below for reference.     Microbiology: Recent Results (from the past 240 hour(s))  Urine Culture     Status: Abnormal   Collection Time: 02/04/22 12:03 AM   Specimen: Urine, Clean Catch  Result Value Ref Range Status   Specimen Description   Final    URINE, CLEAN CATCH Performed at [facility], [place] [address] [place], [place] [address]    Special Requests   Final    NONE Performed at [facility], [place] [address] [place], [place] [address]    Culture (A)  Final    <10,000 COLONIES/mL INSIGNIFICANT GROWTH Performed at [place] [address] [place], [place] [address]    Report Status 02/06/2022 FINAL  Final  Resp Panel by RT-PCR (Flu A&B, Covid) Nasopharyngeal Swab     Status: None   Collection Time: 02/05/22  1:50 AM   Specimen: Nasopharyngeal Swab; Nasopharyngeal(NP) swabs in  vial transport medium  Result Value Ref Range Status   SARS Coronavirus 2 by RT PCR NEGATIVE NEGATIVE Final    Comment: (NOTE) SARS-CoV-2 target nucleic acids are NOT DETECTED.  The SARS-CoV-2 RNA is generally detectable in upper respiratory specimens during the acute phase of infection. The lowest concentration of SARS-CoV-2 viral copies this assay can detect is 138 copies/mL. A negative result does not preclude SARS-Cov-2 infection and should not be used as the sole basis for treatment or other patient management decisions. A negative result may occur with  improper specimen collection/handling, submission of specimen other than nasopharyngeal swab, presence of viral mutation(s) within the areas targeted by this assay, and inadequate number of viral copies(<138 copies/mL). A negative result must be combined with clinical observations, patient history, and epidemiological information. The expected result is Negative.  Fact Sheet for Patients:  EntrepreneurPulse.com.au  Fact Sheet for Healthcare Providers:  IncredibleEmployment.be  This test is no t yet approved or cleared by the [place] FDA and  has been authorized for detection and/or diagnosis of SARS-CoV-2 by FDA under an Emergency Use Authorization (EUA). This EUA will remain  in effect (meaning this test can be used) for the duration of the COVID-19 declaration under Section 564(b)(1) of the Act, 21 U.S.C.section 360bbb-3(b)(1), unless the authorization is terminated  or revoked sooner.       Influenza A by PCR NEGATIVE NEGATIVE Final   Influenza B by PCR NEGATIVE NEGATIVE Final    Comment: (NOTE) The Xpert Xpress SARS-CoV-2/FLU/RSV plus assay is intended as an aid in the diagnosis of influenza from Nasopharyngeal swab specimens and should  not be used as a sole basis for treatment. Nasal washings and aspirates are unacceptable for Xpert Xpress SARS-CoV-2/FLU/RSV testing.  Fact  Sheet for Patients: EntrepreneurPulse.com.au  Fact Sheet for Healthcare Providers: IncredibleEmployment.be  This test is not yet approved or cleared by the [place] FDA and has been authorized for detection and/or diagnosis of SARS-CoV-2 by FDA under an Emergency Use Authorization (EUA). This EUA will remain in effect (meaning this test can be used) for the duration of the COVID-19 declaration under Section 564(b)(1) of the Act, 21 U.S.C. section 360bbb-3(b)(1), unless the authorization is terminated or revoked.  Performed at [facility], [place] [address], [place], [place] [address]      Labs: BNP (last 3 results) No results for input(s): BNP in the last 8760 hours. Basic Metabolic Panel: Recent Labs  Lab 02/04/22 2214 02/05/22 0426  NA 136 136  K 3.6 4.2  CL 102 100  CO2 25 28  GLUCOSE 141* 143*  BUN 23* 21*  CREATININE 1.53* 1.58*  CALCIUM 9.3 9.2   Liver Function Tests: Recent Labs  Lab 02/04/22 2214 02/05/22 0426  AST 96* 87*  ALT 112* 111*  ALKPHOS 144* 133*  BILITOT 0.3 0.5  PROT 7.5 7.3  ALBUMIN 4.1 3.9   Recent Labs  Lab 02/04/22 2214  LIPASE 47   Recent Labs  Lab 02/04/22 2214  AMMONIA 20   CBC: Recent Labs  Lab 02/03/22 1205 02/04/22 2214 02/05/22 0426 02/06/22 0824  WBC 6.8 12.6* 12.6* 9.8  NEUTROABS  --  9.2*  --  5.6  HGB 11.9* 10.8* 10.3* 10.0*  HCT 38.6 33.7* 32.1* 32.0*  MCV 86.9 84.5 85.4 87.7  PLT 394 344 325 344   Cardiac Enzymes: No results for input(s): CKTOTAL, CKMB, CKMBINDEX, TROPONINI in the last 168 hours. BNP: Invalid input(s): POCBNP CBG: Recent Labs  Lab 02/06/22 2015 02/06/22 2057 02/07/22 0011 02/07/22 0516 02/07/22 0734  GLUCAP 141* 169* 107* 109* 119*   D-Dimer No results for input(s): DDIMER in the last 72 hours. Hgb A1c No results for input(s): HGBA1C in the last 72 hours. Lipid Profile No results for input(s): CHOL, HDL, LDLCALC,  TRIG, CHOLHDL, LDLDIRECT in the last 72 hours. Thyroid function studies No results for input(s): TSH, T4TOTAL, T3FREE, THYROIDAB in the last 72 hours.  Invalid input(s): FREET3 Anemia work up No results for input(s): VITAMINB12, FOLATE, FERRITIN, TIBC, IRON, RETICCTPCT in the last 72 hours. Urinalysis    Component Value Date/Time   COLORURINE YELLOW 02/04/2022 0003   APPEARANCEUR CLEAR 02/04/2022 0003   LABSPEC 1.015 02/04/2022 0003   PHURINE 6.0 02/04/2022 0003   GLUCOSEU NEGATIVE 02/04/2022 0003   HGBUR NEGATIVE 02/04/2022 0003   BILIRUBINUR NEGATIVE 02/04/2022 0003   KETONESUR NEGATIVE 02/04/2022 0003   PROTEINUR >=300 (A) 02/04/2022 0003   NITRITE NEGATIVE 02/04/2022 0003   LEUKOCYTESUR NEGATIVE 02/04/2022 0003   Sepsis Labs Invalid input(s): PROCALCITONIN,  WBC,  LACTICIDVEN Microbiology Recent Results (from the past 240 hour(s))  Urine Culture     Status: Abnormal   Collection Time: 02/04/22 12:03 AM   Specimen: Urine, Clean Catch  Result Value Ref Range Status   Specimen Description   Final    URINE, CLEAN CATCH Performed at [facility], [place] [address], [place], [place] [address]    Special Requests   Final    NONE Performed at [facility], [place] [address], [place], [place] [address]    Culture (A)  Final    <10,000  COLONIES/mL INSIGNIFICANT GROWTH Performed at [place] [address], [place], [place] [address]    Report Status 02/06/2022 FINAL  Final  Resp Panel by RT-PCR (Flu A&B, Covid) Nasopharyngeal Swab     Status: None   Collection Time: 02/05/22  1:50 AM   Specimen: Nasopharyngeal Swab; Nasopharyngeal(NP) swabs in vial transport medium  Result Value Ref Range Status   SARS Coronavirus 2 by RT PCR NEGATIVE NEGATIVE Final    Comment: (NOTE) SARS-CoV-2 target nucleic acids are NOT DETECTED.  The SARS-CoV-2 RNA is generally detectable in upper respiratory specimens during the acute phase of infection.  The lowest concentration of SARS-CoV-2 viral copies this assay can detect is 138 copies/mL. A negative result does not preclude SARS-Cov-2 infection and should not be used as the sole basis for treatment or other patient management decisions. A negative result may occur with  improper specimen collection/handling, submission of specimen other than nasopharyngeal swab, presence of viral mutation(s) within the areas targeted by this assay, and inadequate number of viral copies(<138 copies/mL). A negative result must be combined with clinical observations, patient history, and epidemiological information. The expected result is Negative.  Fact Sheet for Patients:  EntrepreneurPulse.com.au  Fact Sheet for Healthcare Providers:  IncredibleEmployment.be  This test is no t yet approved or cleared by the [place] FDA and  has been authorized for detection and/or diagnosis of SARS-CoV-2 by FDA under an Emergency Use Authorization (EUA). This EUA will remain  in effect (meaning this test can be used) for the duration of the COVID-19 declaration under Section 564(b)(1) of the Act, 21 U.S.C.section 360bbb-3(b)(1), unless the authorization is terminated  or revoked sooner.       Influenza A by PCR NEGATIVE NEGATIVE Final   Influenza B by PCR NEGATIVE NEGATIVE Final    Comment: (NOTE) The Xpert Xpress SARS-CoV-2/FLU/RSV plus assay is intended as an aid in the diagnosis of influenza from Nasopharyngeal swab specimens and should not be used as a sole basis for treatment. Nasal washings and aspirates are unacceptable for Xpert Xpress SARS-CoV-2/FLU/RSV testing.  Fact Sheet for Patients: EntrepreneurPulse.com.au  Fact Sheet for Healthcare Providers: IncredibleEmployment.be  This test is not yet approved or cleared by the [place] FDA and has been authorized for detection and/or diagnosis of SARS-CoV-2 by FDA  under an Emergency Use Authorization (EUA). This EUA will remain in effect (meaning this test can be used) for the duration of the COVID-19 declaration under Section 564(b)(1) of the Act, 21 U.S.C. section 360bbb-3(b)(1), unless the authorization is terminated or revoked.  Performed at [facility], [place] [address] [place], [place] [address]       SIGNED:   Charlynne Cousins, MD  [facility] 02/07/2022, 8:23 AM Pager   If 7PM-7AM, please contact night-coverage [email or web address] Password [email or web address]

## 2022-02-07 NOTE — Progress Notes (Signed)
Discharge package printed and instructions given to patient. Patient verbalizes understanding. 

## 2022-02-08 ENCOUNTER — Encounter: Payer: Self-pay | Admitting: Gastroenterology

## 2022-02-08 ENCOUNTER — Telehealth: Payer: Self-pay | Admitting: *Deleted

## 2022-02-08 NOTE — Telephone Encounter (Signed)
Called patient offered appointment today or tomorrow-she declined and stated that she would again try to contact the hospital doctor-she is really just trying to find out when she can go back. I let her know you would need to see her to assess. Virtual and in office both offered. She will call back if she changes her mind, I let her know you had one appt today and one tomorrow. ?

## 2022-02-08 NOTE — Telephone Encounter (Signed)
Patient called and scheduled 1 week hospital follow up for next Tues. Her GI recommended she see her PCP and have HGB checked (tel enc in Epic). She is stating she is in a lot of pain and cannot drive being on the pain medication but the hospital doctors did not mention taking her out of work, nor did they give her a work note-she cannot get in touch with them. She wants to know if you will be able to give her a note for missed work next week? Please advise.  ?

## 2022-02-14 ENCOUNTER — Ambulatory Visit (INDEPENDENT_AMBULATORY_CARE_PROVIDER_SITE_OTHER): Payer: 59 | Admitting: Medical

## 2022-02-14 VITALS — BP 120/70 | HR 66 | Temp 97.6°F | Wt 212.2 lb

## 2022-02-14 DIAGNOSIS — E119 Type 2 diabetes mellitus without complications: Secondary | ICD-10-CM | POA: Diagnosis not present

## 2022-02-14 DIAGNOSIS — K5909 Other constipation: Secondary | ICD-10-CM | POA: Diagnosis not present

## 2022-02-14 DIAGNOSIS — M1A9XX0 Chronic gout, unspecified, without tophus (tophi): Secondary | ICD-10-CM

## 2022-02-14 DIAGNOSIS — I10 Essential (primary) hypertension: Secondary | ICD-10-CM

## 2022-02-14 DIAGNOSIS — R1011 Right upper quadrant pain: Secondary | ICD-10-CM | POA: Insufficient documentation

## 2022-02-14 DIAGNOSIS — S36112D Contusion of liver, subsequent encounter: Secondary | ICD-10-CM | POA: Diagnosis not present

## 2022-02-14 DIAGNOSIS — N1832 Chronic kidney disease, stage 3b: Secondary | ICD-10-CM

## 2022-02-14 DIAGNOSIS — Z794 Long term (current) use of insulin: Secondary | ICD-10-CM

## 2022-02-14 NOTE — Progress Notes (Signed)
Subjective: ?Chief Complaint  ?Patient presents with  ? Hospitalization Follow-up  ?  Hospital follow-up. Right side pain and can radiate to the back..   ? ?Here for hospital f/u.  ? ?Hospitalized at [facility] 02/04/22 - 02/07/22.   ? ?Discharge Diagnoses:  ?Principal Problem: ?  Liver hematoma ?Active Problems: ?  Essential hypertension, benign ?  Stage 3b chronic kidney disease ([place]) ?  Insulin-requiring or dependent type II diabetes mellitus ([place]) ?  OSA (obstructive sleep apnea) ?  Hip fracture ([place]) ? 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   She had CT in the emergency dept showing 8.3 cm intraparenchymal liver hematoma communication with a moderate subscapular hematoma as well as hemoperitoneum without evidence of extravasation.    ? ?Currently she notes still having some ongoing right abdominal pain, but improved from ED discharge. ? ?Currently pain 4/10 but sometimes 8/10 pain.   Pains are like spasms.  No fever, no nvd, no bruising.  Pain feels like heartburn.  No urinary c/o.   No blood in urine or stool.  ? ?She did have some recent constipation.   Desire miralax and prune juice, wasn't improving .  ?Used some colchicine and this worked.  Bowels started moving.   ? ?She doesn't have follow with gastroenterology until 04/2022. ? ?No other aggravating or relieving factors. No other complaint. ? ?Past Medical History:  ?Diagnosis Date  ? Anemia   ? Complication of anesthesia   ? History of thyroid cancer   ? Hyperlipidemia   ? Hypertension   ? PONV (postoperative nausea and vomiting)   ? Thyroid disease   ? ?Current Outpatient Medications on File Prior to Visit  ?Medication Sig Dispense Refill  ? allopurinol (ZYLOPRIM) 100 MG tablet Take 2 tablets (200 mg total) by mouth daily. 90 tablet 1  ? amLODipine-valsartan (EXFORGE) 5-160 MG tablet Take 1 tablet by mouth daily.    ? atorvastatin  (LIPITOR) 80 MG tablet TAKE 1 TABLET DAILY 90 tablet 1  ? colchicine 0.6 MG tablet Take 1 tablet (0.6 mg total) by mouth 2 (two) times daily. Take 2 tablets at onset of gout pain.  Take an additional 1 tablet an hour later, if needed (Patient taking differently: Take 0.6-1.2 mg by mouth daily as needed (gout pain). Take 2 tablets at onset of gout pain.  Take an additional 1 tablet an hour later, if needed) 60 tablet 1  ? famotidine (PEPCID) 20 MG tablet Take 20 mg by mouth 2 (two) times daily as needed for indigestion or heartburn.    ? HYDROcodone-acetaminophen (NORCO) 5-325 MG tablet Take 1 tablet by mouth every 6 (six) hours as needed. 12 tablet 0  ? hydrOXYzine (ATARAX) 10 MG tablet Take 1 tablet (10 mg total) by mouth 3 (three) times daily as needed for itching. 30 tablet 0  ? insulin glargine, 1 Unit Dial, (TOUJEO) 300 UNIT/ML Solostar Pen Inject 10 Units into the skin daily. (Patient taking differently: Inject 7 Units into the skin every evening.) 15 mL 1  ? LUMIGAN 0.01 % SOLN Place 1 drop into both eyes at bedtime.    ? Misc Natural Products (TART CHERRY ADVANCED PO) Take 1 capsule by mouth daily.    ? polyethylene glycol (MIRALAX / GLYCOLAX) 17 g packet Take 17 g by mouth daily. 14 each 0  ? Prenatal Vit-Fe Fumarate-FA (PRENATAL VITAMIN) 27-0.8 MG TABS Take 1  tablet by mouth daily. 90 tablet 3  ? Suvorexant (BELSOMRA) 15 MG TABS Take 1 tablet by mouth daily. (Patient taking differently: Take 1 tablet by mouth daily as needed (insomnia).) 30 tablet 1  ? Continuous Blood Gluc Sensor (FREESTYLE LIBRE 2 SENSOR) MISC by Does not apply route.    ? Insulin Pen Needle (BD PEN NEEDLE NANO U/F) 32G X 4 MM MISC 1 each by Does not apply route at bedtime. 100 each 2  ? ?No current facility-administered medications on file prior to visit.  ? ?ROS as in subjective ? ? ? ? ?Objective: ?BP 120/70   Pulse 66   Temp 97.6 ?F (36.4 ?C)   Wt 212 lb 3.2 oz (96.3 kg)   BMI 38.81 kg/m?  ? ?Wt Readings from Last 3 Encounters:   ?02/14/22 212 lb 3.2 oz (96.3 kg)  ?02/04/22 218 lb (98.9 kg)  ?02/03/22 218 lb (98.9 kg)  ? ?General appearence: alert, no distress, WD/WN,  ?Neck: supple, no lymphadenopathy, no thyromegaly, no masses ?Heart: RRR, normal S1, S2, no murmurs ?Lungs: CTA bilaterally, no wheezes, rhonchi, or rales ?Abdomen: +bs, soft,  tender in RUQ in general, can't tolerate deep palpation in RUQ.  Rest of abdomen nontender, non distended, no masses, no hepatomegaly, no splenomegaly ?Back: tender right mid to lower back paraspinal region ?Pulses: 2+ symmetric, upper and lower extremities, normal cap refill ? ? ? ? ?Assessment: ?Encounter Diagnoses  ?Name Primary?  ? Liver hematoma, subsequent encounter Yes  ? RUQ pain   ? Other constipation   ? Insulin-requiring or dependent type II diabetes mellitus ([place])   ? Stage 3b chronic kidney disease ([place])   ? Essential hypertension, benign   ? Chronic gout without tophus, unspecified cause, unspecified site   ? ? ? ?Plan: ?I reviewed her discharge summary, hospital report, recent imaging in computer from 02/03/22 and 02/04/22 including liver ultrasound, CT abdomen pelvis, CT angio chest. ? ?Reviewed recent labs.   ? ?Regarding recent hematoma s/p liver biopsy, she has improvement but still has some pain.  Labs as below today.   Pending labs, will try to get her back in with gastroenterology sooner. ? ?Constipation - continue miralax daily for help maintaining bowels along with good fiber and water intake ? ? ? ? ?Phil was seen today for hospitalization follow-up. ? ?Diagnoses and all orders for this visit: ? ?Liver hematoma, subsequent encounter ?-     CBC ? ?RUQ pain ? ?Other constipation ? ?Insulin-requiring or dependent type II diabetes mellitus ([place]) ?-     Hemoglobin A1c ? ?Stage 3b chronic kidney disease ([place]) ? ?Essential hypertension, benign ? ?Chronic gout without tophus, unspecified cause, unspecified site ? ? ? ?F/u pending labs. ? ? ? ?

## 2022-02-15 ENCOUNTER — Encounter: Payer: Self-pay | Admitting: Gastroenterology

## 2022-02-15 ENCOUNTER — Other Ambulatory Visit: Payer: Self-pay | Admitting: Medical

## 2022-02-15 LAB — CBC
Hematocrit: 32.2 % — ABNORMAL LOW (ref 34.0–46.6)
Hemoglobin: 10.5 g/dL — ABNORMAL LOW (ref 11.1–15.9)
MCH: 26.4 pg — ABNORMAL LOW (ref 26.6–33.0)
MCHC: 32.6 g/dL (ref 31.5–35.7)
MCV: 81 fL (ref 79–97)
Platelets: 578 10*3/uL — ABNORMAL HIGH (ref 150–450)
RBC: 3.97 x10E6/uL (ref 3.77–5.28)
RDW: 15.1 % (ref 11.7–15.4)
WBC: 9.8 10*3/uL (ref 3.4–10.8)

## 2022-02-15 LAB — HEMOGLOBIN A1C
Est. average glucose Bld gHb Est-mCnc: 154 mg/dL
Hgb A1c MFr Bld: 7 % — ABNORMAL HIGH (ref 4.8–5.6)

## 2022-02-15 MED ORDER — VALACYCLOVIR HCL 1 G PO TABS: 2000.0000 mg | ORAL_TABLET | Freq: Two times a day (BID) | ORAL | 0 refills | Status: AC

## 2022-02-24 ENCOUNTER — Ambulatory Visit: Payer: Managed Care, Other (non HMO) | Admitting: Medical

## 2022-03-01 ENCOUNTER — Telehealth: Payer: Self-pay | Admitting: Medical

## 2022-03-01 NOTE — Telephone Encounter (Signed)
Left message for pt to call me back 

## 2022-03-01 NOTE — Telephone Encounter (Signed)
Pt asked that this be sent to GI so I have redirected this to Dr. Havery Moros ?

## 2022-03-01 NOTE — Telephone Encounter (Signed)
Pt said she thinks GI needs this as we do not as she thinks its referring to liver hematoma ? ?

## 2022-03-01 NOTE — Telephone Encounter (Signed)
I received a form caled " treatment and self-management plan request form" ? ?It asks about diagnoses, length of treatment, etc.  It does not give a lot of other information so I am not exactly sure if this is referencing her recent liver hematoma or other condition. ? ?She sees a gastroenterologist regarding this in general but not sure what prompted the form ?

## 2022-03-03 ENCOUNTER — Ambulatory Visit (INDEPENDENT_AMBULATORY_CARE_PROVIDER_SITE_OTHER): Payer: Managed Care, Other (non HMO) | Admitting: Gastroenterology

## 2022-03-03 DIAGNOSIS — K760 Fatty (change of) liver, not elsewhere classified: Secondary | ICD-10-CM | POA: Diagnosis not present

## 2022-03-03 DIAGNOSIS — Z23 Encounter for immunization: Secondary | ICD-10-CM | POA: Diagnosis not present

## 2022-03-03 DIAGNOSIS — K743 Primary biliary cirrhosis: Secondary | ICD-10-CM

## 2022-03-03 DIAGNOSIS — R748 Abnormal levels of other serum enzymes: Secondary | ICD-10-CM | POA: Diagnosis not present

## 2022-03-07 ENCOUNTER — Encounter: Payer: Self-pay | Admitting: Gastroenterology

## 2022-03-07 ENCOUNTER — Ambulatory Visit (AMBULATORY_SURGERY_CENTER): Payer: 59 | Admitting: Gastroenterology

## 2022-03-07 VITALS — BP 138/79 | HR 72 | Temp 98.4°F | Resp 18 | Ht 62.0 in | Wt 218.0 lb

## 2022-03-07 DIAGNOSIS — Z1211 Encounter for screening for malignant neoplasm of colon: Secondary | ICD-10-CM | POA: Diagnosis present

## 2022-03-07 DIAGNOSIS — K635 Polyp of colon: Secondary | ICD-10-CM

## 2022-03-07 DIAGNOSIS — D122 Benign neoplasm of ascending colon: Secondary | ICD-10-CM

## 2022-03-07 MED ORDER — SODIUM CHLORIDE 0.9 % IV SOLN: 500.0000 mL | Freq: Once | INTRAVENOUS | Status: DC

## 2022-03-07 NOTE — Progress Notes (Signed)
Updated medical record. ? ?VS CW ?

## 2022-03-07 NOTE — Op Note (Signed)
[place] ?Patient Name: Lauren [name] ?Procedure Date: 03/07/2022 3:46 PM ?MRN: [id number] ?Endoscopist: Carlota Raspberry. Havery Moros , MD ?Age: 61 ?Referring MD:  ?Date of Birth: September 23, 1961 ?Gender: Female ?Account #: [id number] ?Procedure:                Colonoscopy ?Indications:              Screening for colorectal malignant neoplasm ?Medicines:                Monitored Anesthesia Care ?Procedure:                Pre-Anesthesia Assessment: ?                          - Prior to the procedure, a History and Physical  ?                          was performed, and patient medications and  ?                          allergies were reviewed. The patient's tolerance of  ?                          previous anesthesia was also reviewed. The risks  ?                          and benefits of the procedure and the sedation  ?                          options and risks were discussed with the patient.  ?                          All questions were answered, and informed consent  ?                          was obtained. Prior Anticoagulants: The patient has  ?                          taken no previous anticoagulant or antiplatelet  ?                          agents. ASA Grade Assessment: III - A patient with  ?                          severe systemic disease. After reviewing the risks  ?                          and benefits, the patient was deemed in  ?                          satisfactory condition to undergo the procedure. ?                          After obtaining informed consent, the colonoscope  ?  was passed under direct vision. Throughout the  ?                          procedure, the patient's blood pressure, pulse, and  ?                          oxygen saturations were monitored continuously. The  ?                          Olympus PCF-H190DL ([id number]) Colonoscope was  ?                          introduced through the anus and advanced to the the  ?                          cecum,  identified by appendiceal orifice and  ?                          ileocecal valve. The colonoscopy was performed  ?                          without difficulty. The patient tolerated the  ?                          procedure well. The quality of the bowel  ?                          preparation was adequate. The ileocecal valve,  ?                          appendiceal orifice, and rectum were photographed. ?Scope In: 3:51:43 PM ?Scope Out: 4:06:08 PM ?Scope Withdrawal Time: 0 hours 11 minutes 11 seconds  ?Total Procedure Duration: 0 hours 14 minutes 25 seconds  ?Findings:                 The perianal and digital rectal examinations were  ?                          normal. ?                          A 5 mm polyp was found in the ascending colon. The  ?                          polyp was sessile. The polyp was removed with a  ?                          cold snare. Resection and retrieval were complete. ?                          A few small-mouthed diverticula were found in the  ?                          sigmoid colon. ?  Internal hemorrhoids were found during  ?                          retroflexion. The hemorrhoids were small. ?                          The exam was otherwise without abnormality. ?Complications:            No immediate complications. Estimated blood loss:  ?                          Minimal. ?Estimated Blood Loss:     Estimated blood loss was minimal. ?Impression:               - One 5 mm polyp in the ascending colon, removed  ?                          with a cold snare. Resected and retrieved. ?                          - Diverticulosis in the sigmoid colon. ?                          - Internal hemorrhoids. ?                          - The examination was otherwise normal. ?Recommendation:           - Patient has a contact number available for  ?                          emergencies. The signs and symptoms of potential  ?                          delayed complications  were discussed with the  ?                          patient. Return to normal activities tomorrow.  ?                          Written discharge instructions were provided to the  ?                          patient. ?                          - Resume previous diet. ?                          - Continue present medications. ?                          - Await pathology results. ?Carlota Raspberry. Havery Moros, MD ?03/07/2022 4:11:31 PM ?This report has been signed electronically. ?

## 2022-03-07 NOTE — Patient Instructions (Signed)
? ? ?HANDOUTS ON POLYPS,DIVERTICULOSIS ,& HEMORRHOIDS GIVEN TO YOU TODAY ? ?AWAIT PATHOLOGY RESULTS ON POLYP REMOVED  ? ? ?YOU HAD AN ENDOSCOPIC PROCEDURE TODAY AT [facility]:   Refer to the procedure report that was given to you for any specific questions about what was found during the examination.  If the procedure report does not answer your questions, please call your gastroenterologist to clarify.  If you requested that your care partner not be given the details of your procedure findings, then the procedure report has been included in a sealed envelope for you to review at your convenience later. ? ?YOU SHOULD EXPECT: Some feelings of bloating in the abdomen. Passage of more gas than usual.  Walking can help get rid of the air that was put into your GI tract during the procedure and reduce the bloating. If you had a lower endoscopy (such as a colonoscopy or flexible sigmoidoscopy) you may notice spotting of blood in your stool or on the toilet paper. If you underwent a bowel prep for your procedure, you may not have a normal bowel movement for a few days. ? ?Please Note:  You might notice some irritation and congestion in your nose or some drainage.  This is from the oxygen used during your procedure.  There is no need for concern and it should clear up in a day or so. ? ?SYMPTOMS TO REPORT IMMEDIATELY: ? ?Following lower endoscopy (colonoscopy or flexible sigmoidoscopy): ? Excessive amounts of blood in the stool ? Significant tenderness or worsening of abdominal pains ? Swelling of the abdomen that is new, acute ? Fever of 100?F or higher ? ? ?For urgent or emergent issues, a gastroenterologist can be reached at any hour by calling [phone]. ?Do not use MyChart messaging for urgent concerns.  ? ? ?DIET:  We do recommend a small meal at first, but then you may proceed to your regular diet.  Drink plenty of fluids but you should avoid alcoholic beverages for 24 hours. ? ?ACTIVITY:  You  should plan to take it easy for the rest of today and you should NOT DRIVE or use heavy machinery until tomorrow (because of the sedation medicines used during the test).   ? ?FOLLOW UP: ?Our staff will call the number listed on your records 48-72 hours following your procedure to check on you and address any questions or concerns that you may have regarding the information given to you following your procedure. If we do not reach you, we will leave a message.  We will attempt to reach you two times.  During this call, we will ask if you have developed any symptoms of COVID 19. If you develop any symptoms (ie: fever, flu-like symptoms, shortness of breath, cough etc.) before then, please call [phone].  If you test positive for Covid 19 in the 2 weeks post procedure, please call and report this information to [place].   ? ?If any biopsies were taken you will be contacted by phone or by letter within the next 1-3 weeks.  Please call us at [phone] if you have not heard about the biopsies in 3 weeks.  ? ? ?SIGNATURES/CONFIDENTIALITY: ?You and/or your care partner have signed paperwork which will be entered into your electronic medical record.  These signatures attest to the fact that that the information above on your After Visit Summary has been reviewed and is understood.  Full responsibility of the confidentiality of this discharge information lies with you and/or  your care-partner.  ?

## 2022-03-07 NOTE — Progress Notes (Signed)
VSS, transported to PACU °

## 2022-03-07 NOTE — Progress Notes (Signed)
[facility] History and Physical ? ? ?Primary Care Physician:  Carlena Hurl, PA-C ? ? ?Reason for Procedure:   Colon cancer screening ? ?Plan:    colonoscopy ? ? ? ? ?HPI: Lauren [name] is a 61 y.o. female  here for colonoscopy screening - last exam > 10 years ago. Patient denies any bowel symptoms at this time. No family history of colon cancer known. Otherwise feels well without any cardiopulmonary symptoms.  ? ? ?Past Medical History:  ?Diagnosis Date  ? Anemia   ? Complication of anesthesia   ? History of thyroid cancer   ? Hyperlipidemia   ? Hypertension   ? PONV (postoperative nausea and vomiting)   ? Thyroid disease   ? ? ?Past Surgical History:  ?Procedure Laterality Date  ? COLONOSCOPY    ? GASTRIC BYPASS    ? KNEE SURGERY Left 2017  ? ? ?Prior to Admission medications   ?Medication Sig Start Date End Date Taking? Authorizing Provider  ?allopurinol (ZYLOPRIM) 100 MG tablet Take 2 tablets (200 mg total) by mouth daily. 01/09/22  Yes Tysinger, Camelia Eng, PA-C  ?amLODipine-valsartan (EXFORGE) 5-160 MG tablet Take 1 tablet by mouth daily.   Yes [provider]  ?atorvastatin (LIPITOR) 80 MG tablet TAKE 1 TABLET DAILY 02/01/22  Yes Tysinger, Camelia Eng, PA-C  ?Continuous Blood Gluc Sensor (FREESTYLE LIBRE 2 SENSOR) MISC by Does not apply route.   Yes [provider]  ?insulin glargine, 1 Unit Dial, (TOUJEO) 300 UNIT/ML Solostar Pen Inject 10 Units into the skin daily. ?Patient taking differently: Inject 7 Units into the skin every evening. 08/05/21  Yes Tysinger, Camelia Eng, PA-C  ?Insulin Pen Needle (BD PEN NEEDLE NANO U/F) 32G X 4 MM MISC 1 each by Does not apply route at bedtime. 07/27/21  Yes Tysinger, Camelia Eng, PA-C  ?LUMIGAN 0.01 % SOLN Place 1 drop into both eyes at bedtime. 08/11/21  Yes [provider]  ?polyethylene glycol (MIRALAX / GLYCOLAX) 17 g packet Take 17 g by mouth daily. 02/07/22  Yes Charlynne Cousins, MD  ?Prenatal Vit-Fe Fumarate-FA (PRENATAL VITAMIN) 27-0.8 MG  TABS Take 1 tablet by mouth daily. 08/04/21  Yes Tysinger, Camelia Eng, PA-C  ?colchicine 0.6 MG tablet Take 1 tablet (0.6 mg total) by mouth 2 (two) times daily. Take 2 tablets at onset of gout pain.  Take an additional 1 tablet an hour later, if needed ?Patient taking differently: Take 0.6-1.2 mg by mouth daily as needed (gout pain). Take 2 tablets at onset of gout pain.  Take an additional 1 tablet an hour later, if needed 09/15/21   Tysinger, Camelia Eng, PA-C  ?famotidine (PEPCID) 20 MG tablet Take 20 mg by mouth 2 (two) times daily as needed for indigestion or heartburn.    [provider]  ?HYDROcodone-acetaminophen (NORCO) 5-325 MG tablet Take 1 tablet by mouth every 6 (six) hours as needed. 02/12/22   Charlynne Cousins, MD  ?hydrOXYzine (ATARAX) 10 MG tablet Take 1 tablet (10 mg total) by mouth 3 (three) times daily as needed for itching. 02/07/22   Charlynne Cousins, MD  ?Misc Natural Products (TART CHERRY ADVANCED PO) Take 1 capsule by mouth daily.    [provider]  ?Suvorexant (BELSOMRA) 15 MG TABS Take 1 tablet by mouth daily. ?Patient taking differently: Take 1 tablet by mouth daily as needed (insomnia). 09/15/21   Tysinger, Camelia Eng, PA-C  ? ? ?Current Outpatient Medications  ?Medication Sig Dispense Refill  ? allopurinol (ZYLOPRIM) 100 MG  tablet Take 2 tablets (200 mg total) by mouth daily. 90 tablet 1  ? amLODipine-valsartan (EXFORGE) 5-160 MG tablet Take 1 tablet by mouth daily.    ? atorvastatin (LIPITOR) 80 MG tablet TAKE 1 TABLET DAILY 90 tablet 1  ? Continuous Blood Gluc Sensor (FREESTYLE LIBRE 2 SENSOR) MISC by Does not apply route.    ? insulin glargine, 1 Unit Dial, (TOUJEO) 300 UNIT/ML Solostar Pen Inject 10 Units into the skin daily. (Patient taking differently: Inject 7 Units into the skin every evening.) 15 mL 1  ? Insulin Pen Needle (BD PEN NEEDLE NANO U/F) 32G X 4 MM MISC 1 each by Does not apply route at bedtime. 100 each 2  ? LUMIGAN 0.01 % SOLN Place 1 drop into both eyes  at bedtime.    ? polyethylene glycol (MIRALAX / GLYCOLAX) 17 g packet Take 17 g by mouth daily. 14 each 0  ? Prenatal Vit-Fe Fumarate-FA (PRENATAL VITAMIN) 27-0.8 MG TABS Take 1 tablet by mouth daily. 90 tablet 3  ? colchicine 0.6 MG tablet Take 1 tablet (0.6 mg total) by mouth 2 (two) times daily. Take 2 tablets at onset of gout pain.  Take an additional 1 tablet an hour later, if needed (Patient taking differently: Take 0.6-1.2 mg by mouth daily as needed (gout pain). Take 2 tablets at onset of gout pain.  Take an additional 1 tablet an hour later, if needed) 60 tablet 1  ? famotidine (PEPCID) 20 MG tablet Take 20 mg by mouth 2 (two) times daily as needed for indigestion or heartburn.    ? HYDROcodone-acetaminophen (NORCO) 5-325 MG tablet Take 1 tablet by mouth every 6 (six) hours as needed. 12 tablet 0  ? hydrOXYzine (ATARAX) 10 MG tablet Take 1 tablet (10 mg total) by mouth 3 (three) times daily as needed for itching. 30 tablet 0  ? Misc Natural Products (TART CHERRY ADVANCED PO) Take 1 capsule by mouth daily.    ? Suvorexant (BELSOMRA) 15 MG TABS Take 1 tablet by mouth daily. (Patient taking differently: Take 1 tablet by mouth daily as needed (insomnia).) 30 tablet 1  ? ?Current Facility-Administered Medications  ?Medication Dose Route Frequency Provider Last Rate Last Admin  ? 0.9 %  sodium chloride infusion  500 mL Intravenous Once Naphtali Zywicki, Carlota Raspberry, MD      ? ? ?Allergies as of 03/07/2022 - Review Complete 03/07/2022  ?Allergen Reaction Noted  ? Oxycodone Itching 07/13/2021  ? ? ?Family History  ?Problem Relation Age of Onset  ? Breast cancer Mother   ? Breast cancer Maternal Grandmother   ? Diabetes Mellitus II Paternal Grandfather   ? Throat cancer Paternal Grandfather   ? Stomach cancer Neg Hx   ? Esophageal cancer Neg Hx   ? Pancreatic cancer Neg Hx   ? Colon cancer Neg Hx   ? ? ?Social History  ? ?Socioeconomic History  ? Marital status: Married  ?  Spouse name: Not on file  ? Number of children: Not  on file  ? Years of education: Not on file  ? Highest education level: Not on file  ?Occupational History  ? Not on file  ?Tobacco Use  ? Smoking status: Former  ?  Packs/day: 1.25  ?  Years: 22.00  ?  Pack years: 27.50  ?  Types: E-cigarettes, Cigarettes  ?  Quit date: 2002  ?  Years since quitting: 21.2  ? Smokeless tobacco: Never  ? Tobacco comments:  ?  Stopped smoking 34yr ago  ?  Vaping Use  ? Vaping Use: Never used  ?Substance and Sexual Activity  ? Alcohol use: Yes  ?  Comment: Occ.  ? Drug use: Not Currently  ?  Comment: gummies  ? Sexual activity: Yes  ?Other Topics Concern  ? Not on file  ?Social History Narrative  ? Not on file  ? ?Social Determinants of Health  ? ?Financial Resource Strain: Not on file  ?Food Insecurity: Not on file  ?Transportation Needs: Not on file  ?Physical Activity: Not on file  ?Stress: Not on file  ?Social Connections: Not on file  ?Intimate Partner Violence: Not on file  ? ? ?Review of Systems: ?All other review of systems negative except as mentioned in the HPI. ? ?Physical Exam: ?Vital signs ?BP 136/76   Pulse 94   Temp 98.4 ?F (36.9 ?C) (Temporal)   Ht '5\' 2"'$  (1.575 m)   Wt 218 lb (98.9 kg)   SpO2 98%   BMI 39.87 kg/m?  ? ?General:   Alert,  Well-developed, pleasant and cooperative in NAD ?Lungs:  Clear throughout to auscultation.   ?Heart:  Regular rate and rhythm ?Abdomen:  Soft, nontender and nondistended.   ?Neuro/Psych:  Alert and cooperative. Normal mood and affect. A and O x 3 ? ?Jolly Mango, MD ?[facility] ? ? ?

## 2022-03-09 ENCOUNTER — Telehealth: Payer: Self-pay

## 2022-03-09 NOTE — Telephone Encounter (Signed)
?  Follow up Call- ? ? ?  03/07/2022  ?  3:09 PM  ?Call back number  ?Post procedure Call Back phone  # [phone]  ?Permission to leave phone message Yes  ?  ?First follow up call, LVM ? ? ?

## 2022-03-09 NOTE — Telephone Encounter (Signed)
?  Follow up Call- ? ? ?  03/07/2022  ?  3:09 PM  ?Call back number  ?Post procedure Call Back phone  # [phone]  ?Permission to leave phone message Yes  ?  ? ?Patient questions: ? ?Do you have a fever, pain , or abdominal swelling? No. ?Pain Score  0 * ? ?Have you tolerated food without any problems? Yes.   ? ?Have you been able to return to your normal activities? Yes.   ? ?Do you have any questions about your discharge instructions: ?Diet   No. ?Medications  No. ?Follow up visit  No. ? ?Do you have questions or concerns about your Care? No. ? ?Actions: ?* If pain score is 4 or above: ?No action needed, pain <4. ? ? ?

## 2022-03-10 ENCOUNTER — Ambulatory Visit (INDEPENDENT_AMBULATORY_CARE_PROVIDER_SITE_OTHER): Payer: 59 | Admitting: Medical

## 2022-03-10 VITALS — BP 120/70 | HR 67 | Temp 96.7°F | Wt 216.2 lb

## 2022-03-10 DIAGNOSIS — M5432 Sciatica, left side: Secondary | ICD-10-CM

## 2022-03-10 DIAGNOSIS — M79652 Pain in left thigh: Secondary | ICD-10-CM

## 2022-03-10 MED ORDER — TIZANIDINE HCL 4 MG PO TABS: 4.0000 mg | ORAL_TABLET | Freq: Every day | ORAL | 0 refills | Status: DC

## 2022-03-10 NOTE — Progress Notes (Signed)
Subjective: ? Lauren [name] is a 61 y.o. female who presents for ?Chief Complaint  ?Patient presents with  ? left leg thigh pain  ?  Left leg thigh pain- been going on for a while  ?   ?Here for complaint of left leg and thigh pain. Been having excruciating pains in middle of back of thigh.  Pains will hit her out of the blue, will make her stop what she is doing, but then as quick as it comes on will go away.  Pains come intermittent.   This week over 2 days, had 3-4 episodes per day.  Has been having this for a few years, but then it resolved, and then has come back again.  Just recently had colonoscopy.  Lying on the table before the colonoscopy has one of these cramps or pains.   ? ?No leg swelling . No calve pain.  Has slight numbness in toes for a long time, but hadn't really thought about it until recently.   Has some back pain, but nothing worse in recent weeks.   No current belly pain.   No hip or ankle pain.    ? ?Stretches some.   Exercise is typically walking at lunch.  Walking some in evenings too.   ? ?She has hx/o left knee surgery in past ? ?She had a recent liver biopsy in February so she is worried about blood clot in the leg. ? ?No other aggravating or relieving factors.   ? ?No other c/o. ? ?Past Medical History:  ?Diagnosis Date  ? Anemia   ? Complication of anesthesia   ? History of thyroid cancer   ? Hyperlipidemia   ? Hypertension   ? PONV (postoperative nausea and vomiting)   ? Thyroid disease   ? ?Past Surgical History:  ?Procedure Laterality Date  ? COLONOSCOPY    ? GASTRIC BYPASS    ? KNEE SURGERY Left 2017  ? ?Current Outpatient Medications on File Prior to Visit  ?Medication Sig Dispense Refill  ? allopurinol (ZYLOPRIM) 100 MG tablet Take 2 tablets (200 mg total) by mouth daily. 90 tablet 1  ? amLODipine-valsartan (EXFORGE) 5-160 MG tablet Take 1 tablet by mouth daily.    ? atorvastatin (LIPITOR) 80 MG tablet TAKE 1 TABLET DAILY 90 tablet 1  ? colchicine 0.6 MG tablet Take 1 tablet (0.6 mg  total) by mouth 2 (two) times daily. Take 2 tablets at onset of gout pain.  Take an additional 1 tablet an hour later, if needed (Patient taking differently: Take 0.6-1.2 mg by mouth daily as needed (gout pain). Take 2 tablets at onset of gout pain.  Take an additional 1 tablet an hour later, if needed) 60 tablet 1  ? Continuous Blood Gluc Sensor (FREESTYLE LIBRE 2 SENSOR) MISC by Does not apply route.    ? famotidine (PEPCID) 20 MG tablet Take 20 mg by mouth 2 (two) times daily as needed for indigestion or heartburn.    ? hydrOXYzine (ATARAX) 10 MG tablet Take 1 tablet (10 mg total) by mouth 3 (three) times daily as needed for itching. 30 tablet 0  ? insulin glargine, 1 Unit Dial, (TOUJEO) 300 UNIT/ML Solostar Pen Inject 10 Units into the skin daily. (Patient taking differently: Inject 7 Units into the skin every evening.) 15 mL 1  ? Insulin Pen Needle (BD PEN NEEDLE NANO U/F) 32G X 4 MM MISC 1 each by Does not apply route at bedtime. 100 each 2  ? LUMIGAN 0.01 % SOLN  Place 1 drop into both eyes at bedtime.    ? polyethylene glycol (MIRALAX / GLYCOLAX) 17 g packet Take 17 g by mouth daily. 14 each 0  ? Prenatal Vit-Fe Fumarate-FA (PRENATAL VITAMIN) 27-0.8 MG TABS Take 1 tablet by mouth daily. 90 tablet 3  ? Suvorexant (BELSOMRA) 15 MG TABS Take 1 tablet by mouth daily. (Patient taking differently: Take 1 tablet by mouth daily as needed (insomnia).) 30 tablet 1  ? ?No current facility-administered medications on file prior to visit.  ? ? ?The following portions of the patient's history were reviewed and updated as appropriate: allergies, current medications, past family history, past medical history, past social history, past surgical history and problem list. ? ?ROS ?Otherwise as in subjective above ? ? ?Objective: ?BP 120/70   Pulse 67   Temp (!) 96.7 ?F (35.9 ?C)   Wt 216 lb 3.2 oz (98.1 kg)   BMI 39.54 kg/m?  ? ?General appearance: alert, no distress, well developed, well nourished ?Back nontender,  relatively normal range of motion ?Abdomen: +bs, soft, non tender, non distended, no masses, no hepatomegaly, no splenomegaly ?MSK: Legs nontender no obvious deformity or swelling.  She has reduced internal hip range of motion of the left compared to the right but otherwise relatively full range of motion, no asymmetry of the calves, ?Pulses: 2+ radial pulses, 2+ pedal pulses, normal cap refill ?Ext: no edema ?Neuro: Normal strength and sensation of legs, negative straight leg raise ? ?Lumbar spine xray 07/13/21: ? ?FINDINGS: ?Normal lumbar lordosis. No acute fracture or lis thesis of the lumbar ?spine. Vertebral body height has been preserved. Intervertebral disc ?height is preserved. Minimal endplate remodeling at H2-9 is in ?keeping with minimal degenerative disc disease. 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 No pars defect noted. The paraspinal soft tissues are ?unremarkable. ? ? ?Assessment: ?Encounter Diagnoses  ?Name Primary?  ? Sciatic nerve pain, left Yes  ? Pain of left thigh   ? ? ? ?Plan: ?Reassured that her symptoms do not suggest a blood clot.  Symptoms suggest more of a sciatica or radicular issue.  We discussed x-ray findings from x-ray of lumbar spine from August 2022. ? ?Advised regular stretching, continue regular exercise.  She can use muscle laxer below as needed.  Caution on sedation.  Consider massage therapy.  Can use occasional over-the-counter Tylenol and topical creams such as IcyHot or capsaicin cream to the back ? ?She will work on specific stretches we discussed over the next several weeks.  If not improving over the next few weeks then we will likely refer to physical therapy ? ? ?[name] was seen today for left leg thigh pain. ? ?Diagnoses and all orders for this visit: ? ?Sciatic nerve pain, left ? ?Pain of left thigh ? ?Other orders ?-     tiZANidine (ZANAFLEX) 4 MG tablet; Take 1 tablet (4 mg total) by mouth at  bedtime. ? ? ? ?Follow up: prn ?

## 2022-03-18 ENCOUNTER — Other Ambulatory Visit: Payer: Self-pay | Admitting: Medical

## 2022-03-23 ENCOUNTER — Telehealth: Payer: Self-pay | Admitting: Internal Medicine

## 2022-03-23 NOTE — Telephone Encounter (Signed)
Pt will come pick up 3 sample packs of belsomra '15mg'$  ?

## 2022-03-23 NOTE — Telephone Encounter (Signed)
P.A for Belsomra '15mg'$  tablet. Sent through cover my meds ? ? ?

## 2022-03-23 NOTE — Telephone Encounter (Signed)
Got a fax saying Belsomra '15mg'$  is not covered. Preferred alternative is  ? ?Zolpidem Tartrate- per 09/2021 Ambien was not working for her ?Doxepin Hcl ?Eszopiclone ?Zaleplon ?Ramelteon ?

## 2022-03-23 NOTE — Telephone Encounter (Signed)
Pt wants to know can she get a couple samples of the belsomra since shane is out. She is not going to set up an appt right now ?

## 2022-03-23 NOTE — Telephone Encounter (Signed)
Note on covered my meds said drug is covered by current benefit plan no futher PA activity needed but when I spoke to pharmacy, they are still denying med. They are going to call and see what they are told and let me know ?

## 2022-04-10 DIAGNOSIS — R748 Abnormal levels of other serum enzymes: Secondary | ICD-10-CM

## 2022-04-10 DIAGNOSIS — K760 Fatty (change of) liver, not elsewhere classified: Secondary | ICD-10-CM

## 2022-04-12 ENCOUNTER — Other Ambulatory Visit (INDEPENDENT_AMBULATORY_CARE_PROVIDER_SITE_OTHER): Payer: 59

## 2022-04-12 DIAGNOSIS — R748 Abnormal levels of other serum enzymes: Secondary | ICD-10-CM | POA: Diagnosis not present

## 2022-04-12 DIAGNOSIS — K760 Fatty (change of) liver, not elsewhere classified: Secondary | ICD-10-CM | POA: Diagnosis not present

## 2022-04-12 LAB — HEPATIC FUNCTION PANEL
ALT: 32 U/L (ref 0–35)
AST: 46 U/L — ABNORMAL HIGH (ref 0–37)
Albumin: 4.4 g/dL (ref 3.5–5.2)
Alkaline Phosphatase: 242 U/L — ABNORMAL HIGH (ref 39–117)
Bilirubin, Direct: 0.1 mg/dL (ref 0.0–0.3)
Total Bilirubin: 0.3 mg/dL (ref 0.2–1.2)
Total Protein: 7.7 g/dL (ref 6.0–8.3)

## 2022-04-18 ENCOUNTER — Telehealth: Payer: Self-pay

## 2022-04-18 ENCOUNTER — Encounter: Payer: Self-pay | Admitting: Gastroenterology

## 2022-04-18 ENCOUNTER — Ambulatory Visit (INDEPENDENT_AMBULATORY_CARE_PROVIDER_SITE_OTHER): Payer: 59 | Admitting: Gastroenterology

## 2022-04-18 VITALS — BP 138/90 | HR 101 | Ht 61.5 in | Wt 217.0 lb

## 2022-04-18 DIAGNOSIS — Z86010 Personal history of colonic polyps: Secondary | ICD-10-CM

## 2022-04-18 DIAGNOSIS — R748 Abnormal levels of other serum enzymes: Secondary | ICD-10-CM | POA: Diagnosis not present

## 2022-04-18 DIAGNOSIS — K760 Fatty (change of) liver, not elsewhere classified: Secondary | ICD-10-CM | POA: Diagnosis not present

## 2022-04-18 NOTE — Telephone Encounter (Signed)
Referral to Hepatology - [facility], faxed on 04-18-22 to [phone]. ?

## 2022-04-18 NOTE — Patient Instructions (Addendum)
If you are age 61 or older, your body mass index should be between 23-30. Your Body mass index is 40.34 kg/m?Lauren [name] If this is out of the aforementioned range listed, please consider follow up with your Primary Care Provider. ? ?If you are age 53 or younger, your body mass index should be between 19-25. Your Body mass index is 40.34 kg/m?Lauren [name] If this is out of the aformentioned range listed, please consider follow up with your Primary Care Provider.  ? ?________________________________________________________ ? ?The [facility] providers would like to encourage you to use [facility] to communicate with providers for non-urgent requests or questions.  Due to long hold times on the telephone, sending your provider a message by [facility] may be a faster and more efficient way to get a response.  Please allow 48 business hours for a response.  Please remember that this is for non-urgent requests.  ?_______________________________________________________ ? ?We are referring you to [facility].  They will contact you directly to schedule an appointment.  It may take a week or more before you hear from them.  Please feel free to contact us if you have not heard from them within 2 weeks and we will follow up on the referral.   ? ?We are referring you to Dr. Johnette Abraham, [facility].  They will contact you directly to schedule an appointment.  It may take a week or more before you hear from them.  Please feel free to contact us if you have not heard from them within 2 weeks and we will follow up on the referral.   ? ?Thank you for entrusting me with your care and for choosing [employer], ?Dr. Furman Cellar ? ? ?

## 2022-04-18 NOTE — Progress Notes (Signed)
? ?HPI :  ?61 year old female here for follow-up visit for abnormal liver enzymes. ? ?Recall I saw her in February for elevated liver enzymes.  Her enzymes have been elevated since August 22 in our system but recall she was told she has had abnormal liver enzymes for more than 5 years. Alk phos elevated from 160s to 250s, AST anywhere from 20s to 40s, ALT 220s to 40s.  Bilirubin normal.  She underwent a right upper quadrant ultrasound in August of last year which showed fatty liver.  She has undergone a serologic work-up for this so far to include negative testing for hepatitis B and C, normal iron studies, negative testing for autoimmune hepatitis, normal ceruloplasmin, normal alpha-1 antitrypsin.  Her antimitochondrial antibody however was markedly positive at 50.  No family history of liver disease. ? 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  She has had a hard time losing weight due to history of sarcoidosis requiring long courses of steroids in the past.  ? ?At her last visit we discussed doing a liver biopsy to confirm the diagnosis of suspected PBC and to assess more so for fibrotic change given this issue has been going on for some time.  She had a liver biopsy on February 24.  Unfortunately her postprocedural course was complicated by a hematoma for which she was admitted to the hospital, had abdominal pain, was in the hospital for a few days.  She has taken some time to recover from this but has completely recovered and feeling much better. ? ?Her liver biopsy showed mild steatosis without fibrosis.  There is no evidence of PBC that was appreciated. ? ?Otherwise since her last visit she had a colonoscopy with me in March 28 for screening purposes.  She had 1 small sessile serrated polyp removed. ? ?Prior workup: ?Colonoscopy 2012 - Dr. Sheela Stack - no report available ?  ?  ?RUQ [place]  08/01/21: ?IMPRESSION: ?1. Heterogeneous increased hepatic parenchymal echogenicity typical ?of hepatic steatosis. ?2. Gallstones without sonographic findings of acute cholecystitis. ?No biliary dilatation. ?  ?  ?AP elevated 167 on 01/12/22 - elevations to 230s in the past ?ALT 21, AST 25, ?  ?AMA positive to 50 (strongly positive) ?  ?DEXA 01/24/22 - NORMAL ? ?Liver biopsy 02/03/22:FINAL MICROSCOPIC DIAGNOSIS:  ? ?A. LIVER, RIGHT LOBE, BIOPSY:  ?- Liver parenchyma with mild steatosis. See comment  ?- No pathologic fibrosis  ? ?COMMENT:  ?The biopsy shows liver with a preserved architecture. Portal tracts  ?alternate fairly regularly with central veins and normally radiating  ?hepatocellular trabeculae. Portal tracts are compact and are generally  ?devoid of an inflammatory cell infiltrate. Bile ducts are seen in nearly  ?all portal tracts and are histologically unremarkable. Portal vascular  ?structures are histologically unremarkable. Hepatic lobules show mild,  ?mostly large droplet macrovesicular steatosis (about 10%) without  ?evidence of steatohepatitis.  There is no cholestasis or significant  ?inflammation. Trichrome and reticulin stains show no evidence of  ?pathologic fibrosis. Iron stain shows no significant stainable iron.  ?PASD stain is negative for globular intracytoplasmic inclusions.  ? ? ?Colonoscopy 03/07/22:The perianal and digital rectal examinations were normal. ?- A 5 mm polyp was found in the ascending colon. The polyp was sessile. The polyp was ?removed with a cold snare. Resection and retrieval were complete. ?- A few small-mouthed diverticula were found in the sigmoid colon. ?-  Internal hemorrhoids were found during retroflexion. The hemorrhoids were small. ?- The exam was otherwise without abnormality. ? ?Surgical [P], colon, ascending, polyp (1) ?- SESSILE SERRATED POLYP (1 OF 1 FRAGMENTS) ?- NO HIGH-GRADE DYSPLASIA OR MALIGNANCY IDENTIFIED ? ?Repeat colonoscopy in 5 years ? ? ?04/12/22 ?AP  242, AST, 46, ALT 32, T bil 0.3 ? ? ? ? ?Past Medical History:  ?Diagnosis Date  ? Anemia   ? Complication of anesthesia   ? History of thyroid cancer   ? Hyperlipidemia   ? Hypertension   ? PONV (postoperative nausea and vomiting)   ? Thyroid disease   ? ? ? ?Past Surgical History:  ?Procedure Laterality Date  ? COLONOSCOPY    ? GASTRIC BYPASS    ? KNEE SURGERY Left 2017  ? ?Family History  ?Problem Relation Age of Onset  ? Breast cancer Mother   ? Breast cancer Maternal Grandmother   ? Diabetes Mellitus II Paternal Grandfather   ? Throat cancer Paternal Grandfather   ? Stomach cancer Neg Hx   ? Esophageal cancer Neg Hx   ? Pancreatic cancer Neg Hx   ? Colon cancer Neg Hx   ? ?Social History  ? ?Tobacco Use  ? Smoking status: Former  ?  Packs/day: 1.25  ?  Years: 22.00  ?  Pack years: 27.50  ?  Types: E-cigarettes, Cigarettes  ?  Quit date: 2002  ?  Years since quitting: 21.3  ? Smokeless tobacco: Never  ? Tobacco comments:  ?  Stopped smoking 24yr ago  ?Vaping Use  ? Vaping Use: Never used  ?Substance Use Topics  ? Alcohol use: Yes  ?  Comment: Occ.  ? Drug use: Not Currently  ?  Comment: gummies  ? ?Current Outpatient Medications  ?Medication Sig Dispense Refill  ? allopurinol (ZYLOPRIM) 100 MG tablet Take 2 tablets (200 mg total) by mouth daily. 90 tablet 1  ? amLODipine-valsartan (EXFORGE) 5-160 MG tablet Take 1 tablet by mouth daily.    ? atorvastatin (LIPITOR) 80 MG tablet TAKE 1 TABLET DAILY 90 tablet 1  ? colchicine 0.6 MG tablet Take 1 tablet (0.6 mg total) by mouth 2 (two) times daily. Take 2 tablets at onset of gout pain.  Take an additional 1 tablet an hour later, if needed (Patient taking differently: Take 0.6-1.2 mg by mouth daily as needed (gout pain). Take 2 tablets at onset of gout pain.  Take an additional 1 tablet an hour later, if needed) 60 tablet 1  ? Continuous Blood Gluc Sensor (FREESTYLE LIBRE 2 SENSOR) MISC by Does not apply route.    ? famotidine (PEPCID) 20 MG tablet Take 20 mg by mouth  2 (two) times daily as needed for indigestion or heartburn.    ? hydrOXYzine (ATARAX) 10 MG tablet Take 1 tablet (10 mg total) by mouth 3 (three) times daily as needed for itching. 30 tablet 0  ? insulin glargine, 1 Unit Dial, (TOUJEO) 300 UNIT/ML Solostar Pen Inject 10 Units into the skin daily. (Patient taking differently: Inject 7 Units into the skin every evening.) 15 mL 1  ? Insulin Pen Needle (BD PEN NEEDLE NANO U/F) 32G X 4 MM MISC 1 each by Does not apply route at bedtime. 100 each 2  ? LUMIGAN 0.01 % SOLN Place 1 drop into both eyes at bedtime.    ? Prenatal Vit-Fe Fumarate-FA (PRENATAL VITAMIN) 27-0.8 MG TABS Take 1 tablet by mouth daily. 90 tablet 3  ? Suvorexant (BELSOMRA) 15 MG TABS Take  1 tablet by mouth daily as needed (insomnia). 30 tablet 0  ? tiZANidine (ZANAFLEX) 4 MG tablet Take 1 tablet (4 mg total) by mouth at bedtime. (Patient taking differently: Take 4 mg by mouth at bedtime. As needed) 15 tablet 0  ? ?No current facility-administered medications for this visit.  ? ?Allergies  ?Allergen Reactions  ? Oxycodone Itching  ? ? ? ?Review of Systems: ?All systems reviewed and negative except where noted in HPI.  ? ? ?Lab Results  ?Component Value Date  ? ALT 32 04/12/2022  ? AST 46 (H) 04/12/2022  ? ALKPHOS 242 (H) 04/12/2022  ? BILITOT 0.3 04/12/2022  ? ? ? ?Physical Exam: ?BP 138/90   Pulse (!) 101   Ht 5' 1.5" (1.562 m)   Wt 217 lb (98.4 kg)   SpO2 96%   BMI 40.34 kg/m?  ?Constitutional: Pleasant,well-developed, female in no acute distress. ?Neurological: Alert and oriented to person place and time. ?Psychiatric: Normal mood and affect. Behavior is normal. ? ? ?ASSESSMENT AND PLAN: ?61 year old 61 year old female here for reassessment the following: ? ?Elevated liver enzymes ?Fatty liver ?Morbid obesity ?History of colon polyps ? ?As above with her elevated alk phos and strongly positive AMA, I thought she more than likely had PBC.  Also noted to have some steatosis.  Liver biopsy was done  to confirm if she had PBC or not and to assess for fibrotic change.  The biopsy does not show any evidence of PBC after review with Dr. Vic Ripper, only mild fatty liver without fibrosis.  Unfortunately as above she had a p

## 2022-04-18 NOTE — Telephone Encounter (Signed)
Referral faxed to [phone] on 5-9 ?

## 2022-04-19 ENCOUNTER — Encounter: Payer: Self-pay | Admitting: Internal Medicine

## 2022-04-20 NOTE — Telephone Encounter (Signed)
Patient has been scheduled on 6-12 at 1:30pm with [place] ?

## 2022-04-28 ENCOUNTER — Ambulatory Visit: Payer: 59 | Admitting: Medical

## 2022-05-01 NOTE — Telephone Encounter (Signed)
Called Lauren [name]'s office at [phone]. Patient has enrolled in the [facility] and she has been assigned the learning courses which she has 30 days to complete.  She will be instructed to call to schedule an appointment once she has finished that.

## 2022-05-26 ENCOUNTER — Encounter: Payer: Self-pay | Admitting: Medical

## 2022-06-02 ENCOUNTER — Ambulatory Visit: Payer: 59 | Admitting: Medical

## 2022-06-10 ENCOUNTER — Other Ambulatory Visit: Payer: Self-pay | Admitting: Medical

## 2022-06-12 MED ORDER — FREESTYLE LIBRE 2 SENSOR MISC: 1 refills | Status: DC

## 2022-06-12 MED ORDER — FREESTYLE LIBRE 2 SENSOR MISC: 0 refills | Status: DC

## 2022-06-12 NOTE — Addendum Note (Signed)
Addended by: Minette Headland A on: 06/12/2022 08:40 AM   Modules accepted: Orders

## 2022-06-30 ENCOUNTER — Telehealth: Payer: Self-pay | Admitting: Medical

## 2022-06-30 ENCOUNTER — Ambulatory Visit (INDEPENDENT_AMBULATORY_CARE_PROVIDER_SITE_OTHER): Payer: 59 | Admitting: Medical

## 2022-06-30 ENCOUNTER — Other Ambulatory Visit: Payer: Self-pay | Admitting: Medical

## 2022-06-30 ENCOUNTER — Encounter: Payer: Self-pay | Admitting: Medical

## 2022-06-30 VITALS — BP 120/80 | HR 75 | Ht 63.0 in | Wt 222.0 lb

## 2022-06-30 DIAGNOSIS — Z794 Long term (current) use of insulin: Secondary | ICD-10-CM

## 2022-06-30 DIAGNOSIS — N3941 Urge incontinence: Secondary | ICD-10-CM | POA: Diagnosis not present

## 2022-06-30 DIAGNOSIS — G4700 Insomnia, unspecified: Secondary | ICD-10-CM

## 2022-06-30 DIAGNOSIS — E890 Postprocedural hypothyroidism: Secondary | ICD-10-CM

## 2022-06-30 DIAGNOSIS — E785 Hyperlipidemia, unspecified: Secondary | ICD-10-CM

## 2022-06-30 DIAGNOSIS — R4182 Altered mental status, unspecified: Secondary | ICD-10-CM

## 2022-06-30 DIAGNOSIS — E790 Hyperuricemia without signs of inflammatory arthritis and tophaceous disease: Secondary | ICD-10-CM

## 2022-06-30 DIAGNOSIS — N200 Calculus of kidney: Secondary | ICD-10-CM

## 2022-06-30 DIAGNOSIS — E119 Type 2 diabetes mellitus without complications: Secondary | ICD-10-CM

## 2022-06-30 DIAGNOSIS — G4733 Obstructive sleep apnea (adult) (pediatric): Secondary | ICD-10-CM

## 2022-06-30 DIAGNOSIS — M1A9XX0 Chronic gout, unspecified, without tophus (tophi): Secondary | ICD-10-CM

## 2022-06-30 DIAGNOSIS — Z0000 Encounter for general adult medical examination without abnormal findings: Secondary | ICD-10-CM | POA: Diagnosis not present

## 2022-06-30 DIAGNOSIS — G8929 Other chronic pain: Secondary | ICD-10-CM

## 2022-06-30 DIAGNOSIS — Z9884 Bariatric surgery status: Secondary | ICD-10-CM

## 2022-06-30 DIAGNOSIS — R112 Nausea with vomiting, unspecified: Secondary | ICD-10-CM

## 2022-06-30 DIAGNOSIS — I10 Essential (primary) hypertension: Secondary | ICD-10-CM | POA: Diagnosis not present

## 2022-06-30 DIAGNOSIS — J301 Allergic rhinitis due to pollen: Secondary | ICD-10-CM

## 2022-06-30 DIAGNOSIS — M549 Dorsalgia, unspecified: Secondary | ICD-10-CM

## 2022-06-30 DIAGNOSIS — D649 Anemia, unspecified: Secondary | ICD-10-CM

## 2022-06-30 DIAGNOSIS — Z9889 Other specified postprocedural states: Secondary | ICD-10-CM

## 2022-06-30 DIAGNOSIS — N1832 Chronic kidney disease, stage 3b: Secondary | ICD-10-CM

## 2022-06-30 DIAGNOSIS — K5909 Other constipation: Secondary | ICD-10-CM

## 2022-06-30 DIAGNOSIS — Z85850 Personal history of malignant neoplasm of thyroid: Secondary | ICD-10-CM

## 2022-06-30 LAB — POCT URINALYSIS DIP (PROADVANTAGE DEVICE)
Bilirubin, UA: NEGATIVE
Glucose, UA: NEGATIVE mg/dL
Ketones, POC UA: NEGATIVE mg/dL
Nitrite, UA: NEGATIVE
Protein Ur, POC: 100 mg/dL — AB
Specific Gravity, Urine: 1.025
Urobilinogen, Ur: NEGATIVE
pH, UA: 6 (ref 5.0–8.0)

## 2022-06-30 MED ORDER — TIZANIDINE HCL 4 MG PO TABS: 4.0000 mg | ORAL_TABLET | Freq: Every day | ORAL | 0 refills | Status: DC

## 2022-06-30 MED ORDER — FREESTYLE LIBRE 2 SENSOR MISC: 1 refills | Status: DC

## 2022-06-30 NOTE — Telephone Encounter (Signed)
Recv'd fax requesting refill Tizanidine '4mg'$  #90 to [facility]

## 2022-06-30 NOTE — Progress Notes (Signed)
Subjective:   HPI  Lauren [name] is a 61 y.o. female who presents for Chief Complaint  Patient presents with   nonfasting cpe    Nonfasting cpe, obgyn-, BS going up and down , pain in leg x year and radiates to hip, throwing up infrequently but just concerned x 1.5 month ago, lifting something is painful near bending of the arm. Would like a renewal of handicap placard, eye exam- September with [facility]   Medical Team: Dr. Vermilion Cellar and Alonza Bogus, PA, GI [place], [place] with liver clinic Dr. Chesley Mires, pulmonology Dr. Vanetta Mulders, orthopedics Dr. Delsa Bern, gynecology Still needs dentist Sees eye doctor, [facility]   Concerns: She has been seeing gastroenterology for ongoing issues with the liver.  She does recently got referred from GI to liver clinic, just had a liver biopsy.   She has concerns about episode she has been having on and off for the last few months.  She will have an episode where she seems to be lightheaded or blacking out followed by immediate vomiting.  Her family members have been around the room this is happened most of the time when she wakes up with a massive vomiting.  Her family has stated that they can feel she is getting ready for vomiting but she did not seem to be conscious or fully aware during the episode where she was out of it.  No convulsions noted.  No one with anything about a seizure but she notes that she was not coherent during these episodes.  No specific numbness tingling or weakness.  No slurred speech.    Diabetes-using freestyle libre.  She is using 7 units of Toujeo at night.  Her freestyle was reviewed and her 30-day average glucose was 141 but she does have some hypoglycemic episodes in the early morning hours.  No significantly high readings.  She denies any low readings when she had the vomiting episodes.  Her blood pressures was not checked when she had these vomiting episodes.  Has occasional gout flareup.   In the last 30 days.  Compliant with allopurinol  OSA - uses CPAP  Needs renewal on handicap card.  Uses handicap sticker for when she has gout flares to park closer to stores.  She has a question about a mole on her right side has been present for the last year and unchanging.  However it does itch and get rubbed on her underwear  No other complaint  Past Medical History:  Diagnosis Date   Anemia    Complication of anesthesia    History of thyroid cancer    Hyperlipidemia    Hypertension    PONV (postoperative nausea and vomiting)    Thyroid disease     Family History  Problem Relation Age of Onset   Breast cancer Mother    Breast cancer Maternal Grandmother    Diabetes Mellitus II Paternal Grandfather    Throat cancer Paternal Grandfather    Stomach cancer Neg Hx    Esophageal cancer Neg Hx    Pancreatic cancer Neg Hx    Colon cancer Neg Hx      Current Outpatient Medications:    allopurinol (ZYLOPRIM) 100 MG tablet, Take 2 tablets (200 mg total) by mouth daily., Disp: 90 tablet, Rfl: 1   amLODipine-valsartan (EXFORGE) 5-160 MG tablet, Take 1 tablet by mouth daily., Disp: , Rfl:    atorvastatin (LIPITOR) 80 MG tablet, TAKE 1 TABLET DAILY, Disp: 90 tablet, Rfl: 1  colchicine 0.6 MG tablet, Take 1 tablet (0.6 mg total) by mouth 2 (two) times daily. Take 2 tablets at onset of gout pain.  Take an additional 1 tablet an hour later, if needed (Patient taking differently: Take 0.6-1.2 mg by mouth daily as needed (gout pain). Take 2 tablets at onset of gout pain.  Take an additional 1 tablet an hour later, if needed), Disp: 60 tablet, Rfl: 1   Continuous Blood Gluc Sensor (FREESTYLE LIBRE 2 SENSOR) MISC, Use on back of arm, Disp: 6 each, Rfl: 1   famotidine (PEPCID) 20 MG tablet, Take 20 mg by mouth 2 (two) times daily as needed for indigestion or heartburn., Disp: , Rfl:    hydrOXYzine (ATARAX) 10 MG tablet, Take 1 tablet (10 mg total) by mouth 3 (three) times daily as needed for  itching., Disp: 30 tablet, Rfl: 0   insulin glargine, 1 Unit Dial, (TOUJEO) 300 UNIT/ML Solostar Pen, Inject 10 Units into the skin daily. (Patient taking differently: Inject 7 Units into the skin every evening.), Disp: 15 mL, Rfl: 1   Insulin Pen Needle (BD PEN NEEDLE NANO U/F) 32G X 4 MM MISC, 1 each by Does not apply route at bedtime., Disp: 100 each, Rfl: 2   LUMIGAN 0.01 % SOLN, Place 1 drop into both eyes at bedtime., Disp: , Rfl:    Prenatal Vit-Fe Fumarate-FA (PRENATAL VITAMIN) 27-0.8 MG TABS, Take 1 tablet by mouth daily., Disp: 90 tablet, Rfl: 3   ursodiol (ACTIGALL) 300 MG capsule, Take 600 mg by mouth 2 (two) times daily., Disp: , Rfl:   Allergies  Allergen Reactions   Oxycodone Itching      Reviewed their medical, surgical, family, social, medication, and allergy history and updated chart as appropriate.   Review of Systems  Constitutional:  Negative for chills, fever, malaise/fatigue and weight loss.  HENT:  Negative for congestion, ear pain, hearing loss, sore throat and tinnitus.   Eyes:  Negative for blurred vision, pain and redness.  Respiratory:  Negative for cough, hemoptysis and shortness of breath.   Cardiovascular:  Negative for chest pain, palpitations, orthopnea, claudication and leg swelling.  Gastrointestinal:  Positive for nausea and vomiting. Negative for abdominal pain, blood in stool, constipation and diarrhea.  Genitourinary:  Negative for dysuria, flank pain, frequency, hematuria and urgency.  Musculoskeletal:  Negative for falls, joint pain and myalgias.  Skin:  Negative for itching and rash.  Neurological:  Positive for dizziness and sensory change. Negative for tingling, speech change, weakness and headaches.  Endo/Heme/Allergies:  Negative for polydipsia. Does not bruise/bleed easily.  Psychiatric/Behavioral:  Negative for depression and memory loss. The patient is not nervous/anxious and does not have insomnia.         06/30/2022    1:49 PM  12/23/2021    2:20 PM 08/29/2021    3:49 PM 07/13/2021   11:34 AM  Depression screen PHQ 2/9  Decreased Interest 0 0 0 3  Down, Depressed, Hopeless 0 0 0 3  PHQ - 2 Score 0 0 0 6  Altered sleeping    3  Tired, decreased energy    0  Change in appetite    3  Feeling bad or failure about yourself     3  Trouble concentrating    0  Moving slowly or fidgety/restless    0  Suicidal thoughts    0  PHQ-9 Score    15  Difficult doing work/chores    Not difficult at all  Objective:  BP 120/80   Pulse 75   Ht '5\' 3"'$  (1.6 m)   Wt 222 lb (100.7 kg)   BMI 39.33 kg/m   Wt Readings from Last 3 Encounters:  06/30/22 222 lb (100.7 kg)  04/18/22 217 lb (98.4 kg)  03/10/22 216 lb 3.2 oz (98.1 kg)   General appearance: alert, no distress, WD/WN, African American female Skin: darker coloration around neck c/w acanthosis nigricans, slight raised brown uniform flat top lesion on right later hip approx 17m diameter, possible SK HEENT: normocephalic, conjunctiva/corneas normal, sclerae anicteric, PERRLA, EOMi, nares patent, no discharge or erythema, pharynx normal Oral cavity: MMM, tongue normal, teeth in good repair Neck: supple, no lymphadenopathy, no thyromegaly, no masses, normal ROM, no bruits Chest: non tender, normal shape and expansion Heart: RRR, normal S1, S2, no murmurs Lungs: CTA bilaterally, no wheezes, rhonchi, or rales Abdomen: +bs, soft, non tender, non distended, no masses, no hepatomegaly, no splenomegaly, no bruits Back: non tender, normal ROM, no scoliosis Musculoskeletal: upper extremities non tender, no obvious deformity, normal ROM throughout, lower extremities non tender, no obvious deformity, normal ROM throughout Extremities: no edema, no cyanosis, no clubbing Pulses: 2+ symmetric, upper and 1+ bilat lower extremities, normal cap refill Neurological: alert, oriented x 3, CN2-12 intact, strength normal upper extremities and lower extremities, sensation normal throughout,  DTRs 2+ throughout, no cerebellar signs, gait normal Psychiatric: normal affect, behavior normal, pleasant  Breast/gyn/rectal - deferred to gynecology   Diabetic Foot Exam - Simple   Simple Foot Form Diabetic Foot exam was performed with the following findings: Yes 06/30/2022  2:32 PM  Visual Inspection No deformities, no ulcerations, no other skin breakdown bilaterally: Yes Sensation Testing Intact to touch and monofilament testing bilaterally: Yes Pulse Check See comments: Yes Comments 1+ pedal pulses     EKG reviewed   Assessment and Plan :   Encounter Diagnoses  Name Primary?   Encounter for health maintenance examination in adult Yes   Stage 3b chronic kidney disease ([facility]    Insulin-requiring or dependent type II diabetes mellitus ([facility]    Essential hypertension, benign    Urge incontinence of urine    Postoperative hypothyroidism    Other constipation    OSA (obstructive sleep apnea)    Insomnia, unspecified type    Hyperlipidemia, unspecified hyperlipidemia type    History of thyroid cancer    H/O left knee surgery    Elevated uric acid in blood    Chronic gout without tophus, unspecified cause, unspecified site    Chronic back pain, unspecified back location, unspecified back pain laterality    Allergic rhinitis due to pollen, unspecified seasonality    Kidney stone    Nausea and vomiting, unspecified vomiting type    Altered mental status, unspecified altered mental status type    Anemia, unspecified type    History of gastric bypass     This visit was a preventative care visit, also known as wellness visit or routine physical.   Topics typically include healthy lifestyle, diet, exercise, preventative care, vaccinations, sick and well care, proper use of emergency dept and after hours care, as well as other concerns.     Recommendations: Continue to return yearly for your annual wellness and preventative care visits.  This gives [place] chance to discuss  healthy lifestyle, exercise, vaccinations, review your chart record, and perform screenings where appropriate.  I recommend you see your eye doctor yearly for routine vision care.  I recommend you see your dentist  yearly for routine dental care including hygiene visits twice yearly.   Vaccination recommendations were reviewed Immunization History  Administered Date(s) Administered   Hepb-cpg 01/27/2022, 03/03/2022   Influenza Split 08/17/2021   Influenza-Unspecified 08/17/2021   PFIZER Comirnaty(Gray Top)Covid-19 Tri-Sucrose Vaccine 03/19/2020, 04/09/2020, 12/08/2020   Pfizer Covid-19 Vaccine Bivalent Booster 88yr & up 08/17/2021, 01/12/2022   Pneumococcal Conjugate-13 12/11/2018   Tdap 10/03/2021   Unspecified SARS-COV-2 Vaccination 08/17/2021   Zoster Recombinat (Shingrix) 05/24/2020, 07/19/2020   Get a flu shot every fall   Screening for cancer: Colon cancer screening: Reviewed 3/23 colonospcy report, due repeat in  5 years  Breast cancer screening: You should perform a self breast exam monthly.   We reviewed recommendations for regular mammograms and breast cancer screening. We will request 2022 mammgraom from gyn  Cervical cancer screening: We reviewed recommendations for pap smear screening.   Skin cancer screening: Check your skin regularly for new changes, growing lesions, or other lesions of concern Come in for evaluation if you have skin lesions of concern.  Lung cancer screening: If you have a greater than 20 pack year history of tobacco use, then you may qualify for lung cancer screening with a chest CT scan.   Please call your insurance company to inquire about coverage for this test.  We currently don't have screenings for other cancers besides breast, cervical, colon, and lung cancers.  If you have a strong family history of cancer or have other cancer screening concerns, please let me know.    Bone health: Get at least 150 minutes of aerobic exercise  weekly Get weight bearing exercise at least once weekly Bone density test:  A bone density test is an imaging test that uses a type of X-ray to measure the amount of calcium and other minerals in your bones. The test may be used to diagnose or screen you for a condition that causes weak or thin bones (osteoporosis), predict your risk for a broken bone (fracture), or determine how well your osteoporosis treatment is working. The bone density test is recommended for females 651and older, or females or males <<34if certain risk factors such as thyroid disease, long term use of steroids such as for asthma or rheumatological issues, vitamin D deficiency, estrogen deficiency, family history of osteoporosis, self or family history of fragility fracture in first degree relative.  Reviewed normal bone density done  01/2022   Heart health: Get at least 150 minutes of aerobic exercise weekly Limit alcohol It is important to maintain a healthy blood pressure and healthy cholesterol numbers  Heart disease screening: Screening for heart disease includes screening for blood pressure, fasting lipids, glucose/diabetes screening, BMI height to weight ratio, reviewed of smoking status, physical activity, and diet.    Goals include blood pressure 120/80 or less, maintaining a healthy lipid/cholesterol profile, preventing diabetes or keeping diabetes numbers under good control, not smoking or using tobacco products, exercising most days per week or at least 150 minutes per week of exercise, and eating healthy variety of fruits and vegetables, healthy oils, and avoiding unhealthy food choices like fried food, fast food, high sugar and high cholesterol foods.    Other tests may possibly include EKG test, CT coronary calcium score, echocardiogram, exercise treadmill stress test.     Medical care options: I recommend you continue to seek care here first for routine care.  We try really hard to have available  appointments Monday through Friday daytime hours for sick visits, acute visits, and physicals.  Urgent care should be used for after hours and weekends for significant issues that cannot wait till the next day.  The emergency department should be used for significant potentially life-threatening emergencies.  The emergency department is expensive, can often have long wait times for less significant concerns, so try to utilize primary care, urgent care, or telemedicine when possible to avoid unnecessary trips to the emergency department.  Virtual visits and telemedicine have been introduced since the pandemic started in 2020, and can be convenient ways to receive medical care.  We offer virtual appointments as well to assist you in a variety of options to seek medical care.   Advanced Directives: I recommend you consider completing a [place] and Living Will.   These documents respect your wishes and help alleviate burdens on your loved ones if you were to become terminally ill or be in a position to need those documents enforced.    You can complete Advanced Directives yourself, have them notarized, then have copies made for our office, for you and for anybody you feel should have them in safe keeping.  Or, you can have an attorney prepare these documents.   If you haven't updated your Last Will and Testament in a while, it may be worthwhile having an attorney prepare these documents together and save on some costs.      Separate significant issues discussed: I am not sure about her episodes of vomiting and seeming presyncopal or possibly even unconscious during these brief events.  Differential could include vasovagal episodes, low blood pressure episodes, seizure, arrhythmia or other.  EKG reviewed, labs today.  Diabetes-pending labs we can make some adjustments.  Wants to avoid hypoglycemia and she is getting some early morning hypoglycemia.  Currently on Toujeo 7 units  nightly  CKD 3-updated labs today, avoid NSAIDs, hydrate well daily  OSA-continue CPAP  Chronic gout, elevated uric acid-continue allopurinol, updated labs today.  Chronic back pain-no specific issues currently  Chronic liver disease-seeing liver specialist recently and has liver clinic appointment coming up since she has started trial of Ursodiol  Skin lesion on the right hip-we discussed possibly seeing dermatology or coming back for excision at some point, although it does appear to be benign  Anemia-updated labs today  Hypertension-continue current medication, monitor blood pressures particular if having any illness episodes    Katonya was seen today for nonfasting cpe.  Diagnoses and all orders for this visit:  Encounter for health maintenance examination in adult -     POCT Urinalysis DIP (Proadvantage Device) -     EKG 12-Lead -     Hemoglobin A1c -     Uric acid -     Lipid panel -     Microalbumin/Creatinine Ratio, Urine -     VITAMIN D 25 Hydroxy (Vit-D Deficiency, Fractures) -     Iron -     Vitamin B12 -     CBC -     Basic metabolic panel -     TSH  Stage 3b chronic kidney disease (HCC)  Insulin-requiring or dependent type II diabetes mellitus (HCC) -     Hemoglobin A1c -     Microalbumin/Creatinine Ratio, Urine  Essential hypertension, benign  Urge incontinence of urine -     POCT Urinalysis DIP (Proadvantage Device)  Postoperative hypothyroidism -     TSH  Other constipation  OSA (obstructive sleep apnea)  Insomnia, unspecified type  Hyperlipidemia, unspecified hyperlipidemia type -  Lipid panel  History of thyroid cancer  H/O left knee surgery  Elevated uric acid in blood  Chronic gout without tophus, unspecified cause, unspecified site -     Uric acid  Chronic back pain, unspecified back location, unspecified back pain laterality  Allergic rhinitis due to pollen, unspecified seasonality  Kidney stone  Nausea and vomiting,  unspecified vomiting type  Altered mental status, unspecified altered mental status type -     EKG 12-Lead  Anemia, unspecified type -     Iron -     Vitamin B12 -     CBC  History of gastric bypass  Other orders -     Continuous Blood Gluc Sensor (FREESTYLE LIBRE 2 SENSOR) MISC; Use on back of arm    Follow-up pending labs, yearly for physical

## 2022-06-30 NOTE — Patient Instructions (Addendum)
This visit was a preventative care visit, also known as wellness visit or routine physical.   Topics typically include healthy lifestyle, diet, exercise, preventative care, vaccinations, sick and well care, proper use of emergency dept and after hours care, as well as other concerns.     Recommendations: Continue to return yearly for your annual wellness and preventative care visits.  This gives [place] a chance to discuss healthy lifestyle, exercise, vaccinations, review your chart record, and perform screenings where appropriate.  I recommend you see your eye doctor yearly for routine vision care.  I recommend you see your dentist yearly for routine dental care including hygiene visits twice yearly.   Vaccination recommendations were reviewed Immunization History  Administered Date(s) Administered   Hepb-cpg 01/27/2022, 03/03/2022   Influenza Split 08/17/2021   Influenza-Unspecified 08/17/2021   PFIZER Comirnaty(Gray Top)Covid-19 Tri-Sucrose Vaccine 03/19/2020, 04/09/2020, 12/08/2020   Pfizer Covid-19 Vaccine Bivalent Booster 62yr & up 08/17/2021, 01/12/2022   Pneumococcal Conjugate-13 12/11/2018   Tdap 10/03/2021   Unspecified SARS-COV-2 Vaccination 08/17/2021   Zoster Recombinat (Shingrix) 05/24/2020, 07/19/2020   Get a flu shot every fall   Screening for cancer: Colon cancer screening: Reviewed 3/23 colonospcy report, due repeat in  5 years  Breast cancer screening: You should perform a self breast exam monthly.   We reviewed recommendations for regular mammograms and breast cancer screening. We will request 2022 mammgraom from gyn  Cervical cancer screening: We reviewed recommendations for pap smear screening.   Skin cancer screening: Check your skin regularly for new changes, growing lesions, or other lesions of concern Come in for evaluation if you have skin lesions of concern.  Lung cancer screening: If you have a greater than 20 pack year history of tobacco use,  then you may qualify for lung cancer screening with a chest CT scan.   Please call your insurance company to inquire about coverage for this test.  We currently don't have screenings for other cancers besides breast, cervical, colon, and lung cancers.  If you have a strong family history of cancer or have other cancer screening concerns, please let me know.    Bone health: Get at least 150 minutes of aerobic exercise weekly Get weight bearing exercise at least once weekly Bone density test:  A bone density test is an imaging test that uses a type of X-ray to measure the amount of calcium and other minerals in your bones. The test may be used to diagnose or screen you for a condition that causes weak or thin bones (osteoporosis), predict your risk for a broken bone (fracture), or determine how well your osteoporosis treatment is working. The bone density test is recommended for females 676and older, or females or males <<96if certain risk factors such as thyroid disease, long term use of steroids such as for asthma or rheumatological issues, vitamin D deficiency, estrogen deficiency, family history of osteoporosis, self or family history of fragility fracture in first degree relative.  Reviewed normal bone density done  01/2022   Heart health: Get at least 150 minutes of aerobic exercise weekly Limit alcohol It is important to maintain a healthy blood pressure and healthy cholesterol numbers  Heart disease screening: Screening for heart disease includes screening for blood pressure, fasting lipids, glucose/diabetes screening, BMI height to weight ratio, reviewed of smoking status, physical activity, and diet.    Goals include blood pressure 120/80 or less, maintaining a healthy lipid/cholesterol profile, preventing diabetes or keeping diabetes numbers under good control, not smoking or  using tobacco products, exercising most days per week or at least 150 minutes per week of exercise, and eating  healthy variety of fruits and vegetables, healthy oils, and avoiding unhealthy food choices like fried food, fast food, high sugar and high cholesterol foods.    Other tests may possibly include EKG test, CT coronary calcium score, echocardiogram, exercise treadmill stress test.     Medical care options: I recommend you continue to seek care here first for routine care.  We try really hard to have available appointments Monday through Friday daytime hours for sick visits, acute visits, and physicals.  Urgent care should be used for after hours and weekends for significant issues that cannot wait till the next day.  The emergency department should be used for significant potentially life-threatening emergencies.  The emergency department is expensive, can often have long wait times for less significant concerns, so try to utilize primary care, urgent care, or telemedicine when possible to avoid unnecessary trips to the emergency department.  Virtual visits and telemedicine have been introduced since the pandemic started in 2020, and can be convenient ways to receive medical care.  We offer virtual appointments as well to assist you in a variety of options to seek medical care.   Advanced Directives: I recommend you consider completing a Luck and Living Will.   These documents respect your wishes and help alleviate burdens on your loved ones if you were to become terminally ill or be in a position to need those documents enforced.    You can complete Advanced Directives yourself, have them notarized, then have copies made for our office, for you and for anybody you feel should have them in safe keeping.  Or, you can have an attorney prepare these documents.   If you haven't updated your Last Will and Testament in a while, it may be worthwhile having an attorney prepare these documents together and save on some costs.      Separate significant issues discussed: I am not sure  about her episodes of vomiting and seeming presyncopal or possibly even unconscious during these brief events.  Differential could include vasovagal episodes, low blood pressure episodes, seizure, arrhythmia or other.  EKG reviewed, labs today.  Diabetes-pending labs we can make some adjustments.  Wants to avoid hypoglycemia and she is getting some early morning hypoglycemia.  Currently on Toujeo 7 units nightly  CKD 3-updated labs today, avoid NSAIDs, hydrate well daily  OSA-continue CPAP  Chronic gout, elevated uric acid-continue allopurinol, updated labs today.  Chronic back pain-no specific issues currently  Chronic liver disease-seeing liver specialist recently and has liver clinic appointment coming up since she has started trial of Ursodiol  Skin lesion on the right hip-we discussed possibly seeing dermatology or coming back for excision at some point, although it does appear to be benign  Anemia-updated labs today  Hypertension-continue current medication, monitor blood pressures particular if having any illness episodes

## 2022-07-01 LAB — LIPID PANEL
Chol/HDL Ratio: 2.7 ratio (ref 0.0–4.4)
Cholesterol, Total: 166 mg/dL (ref 100–199)
HDL: 61 mg/dL (ref >39)
LDL Chol Calc (NIH): 71 mg/dL (ref 0–99)
Triglycerides: 210 mg/dL — ABNORMAL HIGH (ref 0–149)
VLDL Cholesterol Cal: 34 mg/dL (ref 5–40)

## 2022-07-01 LAB — HEMOGLOBIN A1C
Est. average glucose Bld gHb Est-mCnc: 169 mg/dL
Hgb A1c MFr Bld: 7.5 % — ABNORMAL HIGH (ref 4.8–5.6)

## 2022-07-01 LAB — BASIC METABOLIC PANEL
BUN/Creatinine Ratio: 14 (ref 12–28)
BUN: 20 mg/dL (ref 8–27)
CO2: 23 mmol/L (ref 20–29)
Calcium: 10.2 mg/dL (ref 8.7–10.3)
Chloride: 101 mmol/L (ref 96–106)
Creatinine, Ser: 1.44 mg/dL — ABNORMAL HIGH (ref 0.57–1.00)
Glucose: 118 mg/dL — ABNORMAL HIGH (ref 70–99)
Potassium: 3.9 mmol/L (ref 3.5–5.2)
Sodium: 140 mmol/L (ref 134–144)
eGFR: 42 mL/min/{1.73_m2} — ABNORMAL LOW (ref >59)

## 2022-07-01 LAB — CBC
Hematocrit: 37.7 % (ref 34.0–46.6)
Hemoglobin: 12.2 g/dL (ref 11.1–15.9)
MCH: 26.2 pg — ABNORMAL LOW (ref 26.6–33.0)
MCHC: 32.4 g/dL (ref 31.5–35.7)
MCV: 81 fL (ref 79–97)
Platelets: 379 10*3/uL (ref 150–450)
RBC: 4.65 x10E6/uL (ref 3.77–5.28)
RDW: 15.2 % (ref 11.7–15.4)
WBC: 7.5 10*3/uL (ref 3.4–10.8)

## 2022-07-01 LAB — URIC ACID: Uric Acid: 5.8 mg/dL (ref 3.0–7.2)

## 2022-07-01 LAB — VITAMIN B12: Vitamin B-12: 773 pg/mL (ref 232–1245)

## 2022-07-01 LAB — MICROALBUMIN / CREATININE URINE RATIO
Creatinine, Urine: 80.2 mg/dL
Microalb/Creat Ratio: 1884 mg/g creat — ABNORMAL HIGH (ref 0–29)
Microalbumin, Urine: 1510.8 ug/mL

## 2022-07-01 LAB — VITAMIN D 25 HYDROXY (VIT D DEFICIENCY, FRACTURES): Vit D, 25-Hydroxy: 25.4 ng/mL — ABNORMAL LOW (ref 30.0–100.0)

## 2022-07-01 LAB — TSH: TSH: 3.75 u[IU]/mL (ref 0.450–4.500)

## 2022-07-01 LAB — IRON: Iron: 71 ug/dL (ref 27–159)

## 2022-07-03 ENCOUNTER — Other Ambulatory Visit: Payer: Self-pay | Admitting: Medical

## 2022-07-03 DIAGNOSIS — N1832 Chronic kidney disease, stage 3b: Secondary | ICD-10-CM

## 2022-07-03 DIAGNOSIS — M1A9XX0 Chronic gout, unspecified, without tophus (tophi): Secondary | ICD-10-CM

## 2022-07-03 MED ORDER — VITAMIN D 50 MCG (2000 UT) PO CAPS: 1.0000 | ORAL_CAPSULE | Freq: Every day | ORAL | 3 refills | Status: DC

## 2022-07-03 MED ORDER — ALLOPURINOL 100 MG PO TABS: 200.0000 mg | ORAL_TABLET | Freq: Every day | ORAL | 3 refills | Status: DC

## 2022-07-03 MED ORDER — AMLODIPINE BESYLATE-VALSARTAN 5-160 MG PO TABS: 1.0000 | ORAL_TABLET | Freq: Every day | ORAL | 3 refills | Status: DC

## 2022-07-03 MED ORDER — ATORVASTATIN CALCIUM 80 MG PO TABS
80.0000 mg | ORAL_TABLET | Freq: Every day | ORAL | 3 refills | Status: DC
Start: 2022-07-03 — End: 2023-03-19

## 2022-07-03 MED ORDER — FREESTYLE LIBRE 2 SENSOR MISC: 3 refills | Status: DC

## 2022-07-03 MED ORDER — COLCHICINE 0.6 MG PO TABS: 0.6000 mg | ORAL_TABLET | Freq: Every day | ORAL | 0 refills | Status: DC | PRN

## 2022-07-03 MED ORDER — INSULIN GLARGINE (1 UNIT DIAL) 300 UNIT/ML ~~LOC~~ SOPN
6.0000 [IU] | PEN_INJECTOR | Freq: Every evening | SUBCUTANEOUS | 1 refills | Status: DC
Start: 2022-07-03 — End: 2022-10-06

## 2022-07-04 ENCOUNTER — Other Ambulatory Visit: Payer: Self-pay | Admitting: Medical

## 2022-07-04 MED ORDER — SEMAGLUTIDE(0.25 OR 0.5MG/DOS) 2 MG/3ML ~~LOC~~ SOPN: 0.2500 mg | PEN_INJECTOR | SUBCUTANEOUS | 1 refills | Status: DC

## 2022-07-12 NOTE — Telephone Encounter (Signed)
done

## 2022-07-13 ENCOUNTER — Telehealth: Payer: Self-pay | Admitting: Internal Medicine

## 2022-07-13 MED ORDER — BD PEN NEEDLE NANO U/F 32G X 4 MM MISC: 1.0000 | Freq: Every day | 2 refills | Status: DC

## 2022-07-13 NOTE — Telephone Encounter (Signed)
Pt request for more pen needed for her medication. I will send this in

## 2022-07-17 ENCOUNTER — Other Ambulatory Visit: Payer: Self-pay | Admitting: Medical

## 2022-07-19 ENCOUNTER — Telehealth: Payer: Self-pay | Admitting: Medical

## 2022-07-19 NOTE — Telephone Encounter (Signed)
P.A. OZEMPIC approved til 07/10/23

## 2022-08-16 ENCOUNTER — Encounter: Payer: Self-pay | Admitting: Internal Medicine

## 2022-08-21 ENCOUNTER — Other Ambulatory Visit: Payer: Self-pay | Admitting: Nephrology

## 2022-08-21 DIAGNOSIS — R809 Proteinuria, unspecified: Secondary | ICD-10-CM

## 2022-08-21 DIAGNOSIS — I129 Hypertensive chronic kidney disease with stage 1 through stage 4 chronic kidney disease, or unspecified chronic kidney disease: Secondary | ICD-10-CM

## 2022-08-21 DIAGNOSIS — N2581 Secondary hyperparathyroidism of renal origin: Secondary | ICD-10-CM

## 2022-08-21 DIAGNOSIS — N1832 Chronic kidney disease, stage 3b: Secondary | ICD-10-CM

## 2022-08-21 DIAGNOSIS — D631 Anemia in chronic kidney disease: Secondary | ICD-10-CM

## 2022-08-21 DIAGNOSIS — D869 Sarcoidosis, unspecified: Secondary | ICD-10-CM

## 2022-08-23 ENCOUNTER — Ambulatory Visit
Admission: RE | Admit: 2022-08-23 | Discharge: 2022-08-23 | Disposition: A | Discharge status: Home / Self Care | Payer: 59 | Source: Ambulatory Visit | Attending: Nephrology | Admitting: Nephrology

## 2022-08-23 DIAGNOSIS — I129 Hypertensive chronic kidney disease with stage 1 through stage 4 chronic kidney disease, or unspecified chronic kidney disease: Secondary | ICD-10-CM

## 2022-08-23 DIAGNOSIS — R809 Proteinuria, unspecified: Secondary | ICD-10-CM

## 2022-08-23 DIAGNOSIS — N1832 Chronic kidney disease, stage 3b: Secondary | ICD-10-CM

## 2022-08-23 DIAGNOSIS — D869 Sarcoidosis, unspecified: Secondary | ICD-10-CM

## 2022-08-23 DIAGNOSIS — N2581 Secondary hyperparathyroidism of renal origin: Secondary | ICD-10-CM

## 2022-08-23 DIAGNOSIS — D631 Anemia in chronic kidney disease: Secondary | ICD-10-CM

## 2022-08-25 NOTE — Telephone Encounter (Signed)
Pt was made an appt. Lauren [name]

## 2022-08-28 ENCOUNTER — Telehealth: Payer: 59 | Admitting: Medical

## 2022-09-06 ENCOUNTER — Encounter: Payer: Self-pay | Admitting: Medical

## 2022-09-19 ENCOUNTER — Encounter: Payer: Self-pay | Admitting: Internal Medicine

## 2022-09-24 ENCOUNTER — Other Ambulatory Visit: Payer: Self-pay | Admitting: Medical

## 2022-09-25 ENCOUNTER — Encounter: Payer: Self-pay | Admitting: Medical

## 2022-09-25 ENCOUNTER — Telehealth: Payer: Self-pay | Admitting: Medical

## 2022-09-25 NOTE — Telephone Encounter (Signed)
Make sure she is on the schedule soon for fasting med check.  She should have had a scheduled appointment this months 3 months from her last visit

## 2022-09-29 ENCOUNTER — Other Ambulatory Visit: Payer: 59

## 2022-10-06 ENCOUNTER — Ambulatory Visit: Payer: 59 | Admitting: Medical

## 2022-10-06 ENCOUNTER — Ambulatory Visit (INDEPENDENT_AMBULATORY_CARE_PROVIDER_SITE_OTHER): Payer: 59 | Admitting: Medical

## 2022-10-06 VITALS — BP 122/80 | HR 74 | Wt 218.8 lb

## 2022-10-06 DIAGNOSIS — E119 Type 2 diabetes mellitus without complications: Secondary | ICD-10-CM

## 2022-10-06 DIAGNOSIS — K743 Primary biliary cirrhosis: Secondary | ICD-10-CM | POA: Diagnosis not present

## 2022-10-06 DIAGNOSIS — Z794 Long term (current) use of insulin: Secondary | ICD-10-CM | POA: Diagnosis not present

## 2022-10-06 DIAGNOSIS — E790 Hyperuricemia without signs of inflammatory arthritis and tophaceous disease: Secondary | ICD-10-CM

## 2022-10-06 DIAGNOSIS — M1A9XX0 Chronic gout, unspecified, without tophus (tophi): Secondary | ICD-10-CM

## 2022-10-06 DIAGNOSIS — E785 Hyperlipidemia, unspecified: Secondary | ICD-10-CM

## 2022-10-06 DIAGNOSIS — E890 Postprocedural hypothyroidism: Secondary | ICD-10-CM

## 2022-10-06 DIAGNOSIS — Z85850 Personal history of malignant neoplasm of thyroid: Secondary | ICD-10-CM

## 2022-10-06 DIAGNOSIS — I10 Essential (primary) hypertension: Secondary | ICD-10-CM

## 2022-10-06 DIAGNOSIS — N1832 Chronic kidney disease, stage 3b: Secondary | ICD-10-CM

## 2022-10-06 LAB — POCT GLYCOSYLATED HEMOGLOBIN (HGB A1C): Hemoglobin A1C: 7.3 % — AB (ref 4.0–5.6)

## 2022-10-06 NOTE — Progress Notes (Unsigned)
Subjective:  Lauren [name] is a 61 y.o. female who presents for Chief Complaint  Patient presents with   med check    Med check,      Here for med check.  Diabetes-compliant with Ozempic weekly.  No foot concerns. Sugars been looking good, no lows, no crazy highs.  No foot concerns.  After discussing, she hasnt' been rotating the dial on ozempic to the 0.'25mg'$  mark, just a few clicks, so likely not getting a lot of Ozempic.  Vitamin D deficiency-compliant with vitamin D supplement  Hypertension-compliant with amlodipine valsartan 5/160 mg daily  Hyperlipidemia-compliant with Lipitor 80 mg daily without complaint  History of elevated uric acid-compliant with allopurinol 200 mg daily.  No recent problems.  No other aggravating or relieving factors.    No other c/o.  Past Medical History:  Diagnosis Date   Anemia    Complication of anesthesia    History of thyroid cancer    Hyperlipidemia    Hypertension    PONV (postoperative nausea and vomiting)    Thyroid disease    Current Outpatient Medications on File Prior to Visit  Medication Sig Dispense Refill   allopurinol (ZYLOPRIM) 100 MG tablet Take 2 tablets (200 mg total) by mouth daily. 180 tablet 3   amLODipine-valsartan (EXFORGE) 5-160 MG tablet Take 1 tablet by mouth daily. 90 tablet 3   atorvastatin (LIPITOR) 80 MG tablet Take 1 tablet (80 mg total) by mouth daily. 90 tablet 3   Cholecalciferol (VITAMIN D) 50 MCG (2000 UT) CAPS Take 1 capsule (2,000 Units total) by mouth daily. 90 capsule 3   Cobalamin Combinations (B-12) 1000-400 MCG SUBL Place under the tongue.     colchicine 0.6 MG tablet Take 1-2 tablets (0.6-1.2 mg total) by mouth daily as needed (gout pain). Take 2 tablets at onset of gout pain.  Take an additional 1 tablet an hour later, if needed 90 tablet 0   Continuous Blood Gluc Sensor (FREESTYLE LIBRE 2 SENSOR) MISC Use on back of arm 6 each 3   Insulin Pen Needle (BD PEN NEEDLE NANO U/F) 32G X 4 MM MISC 1 each by  Does not apply route at bedtime. 200 each 2   LUMIGAN 0.01 % SOLN Place 1 drop into both eyes at bedtime.     Semaglutide,0.25 or 0.'5MG'$ /DOS, 2 MG/3ML SOPN Inject 0.25 mg into the skin once a week. 3 mL 1   tiZANidine (ZANAFLEX) 4 MG tablet TAKE 1 TABLET AT BEDTIME 90 tablet 0   ursodiol (ACTIGALL) 300 MG capsule Take 600 mg by mouth 2 (two) times daily.     No current facility-administered medications on file prior to visit.     The following portions of the patient's history were reviewed and updated as appropriate: allergies, current medications, past family history, past medical history, past social history, past surgical history and problem list.  ROS Otherwise as in subjective above  Objective: BP 122/80   Pulse 74   Wt 218 lb 12.8 oz (99.2 kg)   BMI 38.76 kg/m   General appearance: alert, no distress, well developed, well nourished Neck: supple, no lymphadenopathy, no thyromegaly, no masses Heart: RRR, normal S1, S2, no murmurs Pulses: 2+ radial pulses, 2+ pedal pulses, normal cap refill Ext: no edema   Assessment: Encounter Diagnoses  Name Primary?   Stage 3b chronic kidney disease ([place]) Yes   Primary biliary cholangitis (HCC)    Chronic gout without tophus, unspecified cause, unspecified site    Elevated uric acid in  blood    Essential hypertension, benign    History of thyroid cancer    Hyperlipidemia, unspecified hyperlipidemia type    Insulin-requiring or dependent type II diabetes mellitus ([place])    Postoperative hypothyroidism      Plan: Diabetes We discussed Ozempic.  Apparently she is not dialing up all the way to the 0.25 mg dose, so she has not been getting a full dose.  We discussed the proper use of the pen and demonstrated proper use.  Hemoglobin A1c today ordered.  Continue freestyle libre monitoring.  Glad to hear she is feeling better and seeing good readings on her freestyle  Primary biliary cholangitis-currently on ursodiol.  I reviewed her  September 2023 consult notes.  She has follow-up planned in 6 months and she is seeing good response to the medication  hypertension Continue current medication amlodipine valsartan 5/160 mg daily  Hyperlipidemia-continue atorvastatin 80 mg daily  Vitamin D deficiency-continue supplement  CKD-managed by nephrology, continue current medications   Vaccine counseling: Immunization History  Administered Date(s) Administered   Hepb-cpg 01/27/2022, 03/03/2022   Influenza Split 08/17/2021   Influenza, High Dose Seasonal PF 09/02/2022   Influenza-Unspecified 08/17/2021   PFIZER Comirnaty(Gray Top)Covid-19 Tri-Sucrose Vaccine 03/19/2020, 04/09/2020, 12/08/2020   Pfizer Covid-19 Vaccine Bivalent Booster 60yr & up 08/17/2021, 01/12/2022   Pneumococcal Conjugate-13 12/11/2018   Pneumococcal-Unspecified 04/23/2020   Rsv, Bivalent, Protein Subunit Rsvpref,pf ([place] 09/02/2022   Tdap 10/03/2021   Unspecified SARS-COV-2 Vaccination 08/17/2021   Zoster Recombinat (Shingrix) 05/24/2020, 07/19/2020    I reviewed [facility] and [facility] from earlier this year.  No additional labs today  Lauren [name] was seen today for med check.  Diagnoses and all orders for this visit:  Stage 3b chronic kidney disease ([facility]  Primary biliary cholangitis ([facility]  Chronic gout without tophus, unspecified cause, unspecified site  Elevated uric acid in blood  Essential hypertension, benign  History of thyroid cancer  Hyperlipidemia, unspecified hyperlipidemia type  Insulin-requiring or dependent type II diabetes mellitus ([place] -     HgB A1c  Postoperative hypothyroidism    Follow up: 449mo

## 2022-10-16 ENCOUNTER — Other Ambulatory Visit: Payer: Self-pay | Admitting: Medical

## 2022-10-17 ENCOUNTER — Encounter: Payer: Self-pay | Admitting: Medical

## 2022-10-17 ENCOUNTER — Other Ambulatory Visit: Payer: Self-pay | Admitting: Medical

## 2022-10-17 MED ORDER — B-12 1000-400 MCG SL SUBL: 1.0000 | SUBLINGUAL_TABLET | Freq: Every day | SUBLINGUAL | 1 refills | Status: DC

## 2022-10-20 ENCOUNTER — Other Ambulatory Visit: Payer: Self-pay | Admitting: Medical

## 2022-10-20 ENCOUNTER — Telehealth: Payer: Self-pay | Admitting: Internal Medicine

## 2022-10-20 NOTE — Telephone Encounter (Signed)
Pt called and states that she is having low BS readings in the 50's. Even after eating half bag of skittles to get her sugar up its still low and isn't going up. She just needs to know what to do?

## 2022-10-20 NOTE — Telephone Encounter (Signed)
Pt was notified and has glucose tablets. Its been 52-58 recently but just checked it again after eating crackers and cheese and it was 102.

## 2022-11-16 ENCOUNTER — Other Ambulatory Visit: Payer: Self-pay | Admitting: Medical

## 2022-11-17 NOTE — Telephone Encounter (Signed)
Please review request. This mediation is no longer on active medication list.

## 2022-11-17 NOTE — Telephone Encounter (Signed)
Please call patient.  I got a refill request on Ozempic.  Last visit she was not dialing up to the correct line.  As long as she is tolerating this, it is time to go up to the next higher dose.  Please make sure she is ready to go to the next higher dose and I will send in the refill.

## 2022-11-17 NOTE — Telephone Encounter (Signed)
I called and spoke with patient. She advised me that she is currently on 0.'25mg'$  weekly. She does not want to increase to the next higher dose. She says her doctor at [facility] started her on Farxiga and  her blood sugars were dropping to low due to taking Ozempic and [place]. They had to adjust the dose of her regular insulin to prevent her sugars from dropping to low. Patient wants to stay on the 0.'25mg'$  dose of Ozempic. She is tolerating it well.

## 2022-11-21 ENCOUNTER — Other Ambulatory Visit: Payer: Self-pay | Admitting: Medical

## 2022-11-21 MED ORDER — SEMAGLUTIDE(0.25 OR 0.5MG/DOS) 2 MG/3ML ~~LOC~~ SOPN: 0.2500 mg | PEN_INJECTOR | SUBCUTANEOUS | 1 refills | Status: DC

## 2022-11-30 ENCOUNTER — Telehealth: Payer: Self-pay | Admitting: Medical

## 2022-11-30 NOTE — Telephone Encounter (Signed)
Waiting on shane to reply

## 2022-11-30 NOTE — Telephone Encounter (Signed)
Pt wants to stay  at current dose.   Pt also wanted me to ask if you could read Genetic Testing salvia DNA. Its called 23 and me. She ordered this and once she gets results she will forward to you to look at.

## 2022-11-30 NOTE — Telephone Encounter (Signed)
[name] called and was wondering if Lauren [name]'s Ozempic dosage was supposed to increase or is she  staying at the same dosage she has been taking, they just wanted to be sure because they know it is normally increased.Provided call back number [phone] and reference number [id number].  Tagging Gabriel Cirri because I wont be here for the full day to call them back for you.

## 2022-12-05 ENCOUNTER — Other Ambulatory Visit: Payer: Self-pay | Admitting: Medical

## 2022-12-06 NOTE — Telephone Encounter (Signed)
Per October visit- follow-up in 4 months which would be February. Please advise if ok or do you want her to follow-up sooner

## 2022-12-21 ENCOUNTER — Telehealth (INDEPENDENT_AMBULATORY_CARE_PROVIDER_SITE_OTHER): Payer: 59 | Admitting: Family Medicine

## 2022-12-21 ENCOUNTER — Encounter: Payer: Self-pay | Admitting: Family Medicine

## 2022-12-21 VITALS — BP 153/86 | HR 86 | Temp 102.5°F | Ht 63.0 in | Wt 211.0 lb

## 2022-12-21 DIAGNOSIS — U071 COVID-19: Secondary | ICD-10-CM

## 2022-12-21 MED ORDER — MOLNUPIRAVIR EUA 200MG CAPSULE: 4.0000 | ORAL_CAPSULE | Freq: Two times a day (BID) | ORAL | 0 refills | Status: AC

## 2022-12-21 NOTE — Telephone Encounter (Signed)
Handled by phone

## 2022-12-21 NOTE — Progress Notes (Signed)
Start time: 10:46 End time: 11:11  Virtual Visit via Video Note  I connected with Lauren [name] on 12/21/22 by a video enabled telemedicine application and verified that I am speaking with the correct person using two identifiers.  Location: Patient: home Provider: office   I discussed the limitations of evaluation and management by telemedicine and the availability of in person appointments. The patient expressed understanding and agreed to proceed.  History of Present Illness:  Chief Complaint  Patient presents with   Covid Positive    VIRTUAL positive covid test this morning x 2.  Symptoms started this am. HA, body aches, chills, fever and runny nose.    Symptoms started this morning--HA, runny nose, felt warm, headache (across the forehead). Rare cough. Nasal drainage was slightly bloody, otherwise clear. T102.5. Felt fine yesterday. 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  Hasn't taken any OTC meds yet.  PMH, PSH, SH reviewed Lauren [name] is PCP)-- DM, HTN, CKD, gout  Outpatient Encounter Medications as of 12/21/2022  Medication Sig   allopurinol (ZYLOPRIM) 100 MG tablet Take 2 tablets (200 mg total) by mouth daily.   amLODipine-valsartan (EXFORGE) 5-160 MG tablet Take 1 tablet by mouth daily.   atorvastatin (LIPITOR) 80 MG tablet Take 1 tablet (80 mg total) by mouth daily.   Cholecalciferol (VITAMIN D) 50 MCG (2000 UT) CAPS Take 1 capsule (2,000 Units total) by mouth daily.   Cobalamin Combinations (B-12) 1000-400 MCG SUBL Place 1 tablet under the tongue daily.   Continuous Blood Gluc Sensor (FREESTYLE LIBRE 2 SENSOR) MISC Use on back of arm   FARXIGA 10 MG TABS tablet Take 10 mg by mouth daily.   LUMIGAN 0.01 % SOLN Place 1 drop into both eyes at bedtime.   Semaglutide,0.25 or 0.'5MG'$ /DOS, 2 MG/3ML SOPN Inject 0.25 mg into the skin once a week.   tiZANidine (ZANAFLEX) 4 MG tablet TAKE 1 TABLET AT BEDTIME   ursodiol (ACTIGALL) 300  MG capsule Take 600 mg by mouth 2 (two) times daily.   colchicine 0.6 MG tablet Take 1-2 tablets (0.6-1.2 mg total) by mouth daily as needed (gout pain). Take 2 tablets at onset of gout pain.  Take an additional 1 tablet an hour later, if needed (Patient not taking: Reported on 12/21/2022)   Insulin Pen Needle (BD PEN NEEDLE NANO U/F) 32G X 4 MM MISC 1 each by Does not apply route at bedtime. (Patient not taking: Reported on 12/21/2022)   [DISCONTINUED] ursodiol (ACTIGALL) 300 MG capsule Take 600 mg by mouth 2 (two) times daily.   No facility-administered encounter medications on file as of 12/21/2022.   Allergies  Allergen Reactions   Oxycodone Itching   ROS: fever and URI symptoms per HPI. No loss of taste or smell. Some nausea, no vomiting or diarrhea. No chest pain, shortness of breath, rash. See HPI   Observations/Objective:  BP (!) 153/86   Pulse 86   Temp (!) 102.5 F (39.2 C) (Temporal)   Ht '5\' 3"'$  (1.6 m)   Wt 211 lb (95.7 kg)   BMI 37.38 kg/m   Pleasant female, in bed, in no distress She is alert and oriented.  No coughing during visit, she is speaking comfortably. Cranial nerves are grossly intact.  Exam is limited due to the virtual nature of the visit.  Assessment and Plan:  COVID-19 - Reviewed risks/SE of meds, using molnupiravir due to drug interactions with paxlovid. Reviewed supportive measures, isolation/masking - Plan: molnupiravir EUA (LAGEVRIO)  200 mg CAPS capsule  Reviewed masking in the home, to try and prevent spread to her husband. DIscussed OTC meds, molnupiravir (not paxlovid, due to drug interactions (BP meds, statin, etc)) She already called [facility], [facility] will be per their f/u.  All questions answered.  Stay well hydrated, drink plenty of water. Use tylenol as needed for fever. If your mucus gets very thick, you can use guaifenesin (in mucinex and robitussin). If you develop a cough, you want to use the guaifenesin as well as dextromethorphan (Mucinex  DM or Robitussin DM). Avoid any decongestants (anything that says "sinus", or has phenylephrine) If you develop sinus pain, use sinus rinses (Neti-pot or a sinus rinse kit, with distilled or boiled water, not taper). Use these once or twice daily, if needed.  Take the antiviral medication as indicated.  Contact us if you have persistent fever, worsening symptoms, chest pain, shortness of breath, pain with breathing, or other new/concerning symptoms.  Try and isolate yourself from your husband, if possible (and wear N95 masks around each other if you need to be in the same room.    Follow Up Instructions:    I discussed the assessment and treatment plan with the patient. The patient was provided an opportunity to ask questions and all were answered. The patient agreed with the plan and demonstrated an understanding of the instructions.   The patient was advised to call back or seek an in-person evaluation if the symptoms worsen or if the condition fails to improve as anticipated.  I spent 32 minutes dedicated to the care of this patient, including pre-visit review of records, face to face time, post-visit ordering of testing and documentation.    Lauren Ports, MD

## 2022-12-21 NOTE — Patient Instructions (Signed)
Stay well hydrated, drink plenty of water. Use tylenol as needed for fever. If your mucus gets very thick, you can use guaifenesin (in mucinex and robitussin). If you develop a cough, you want to use the guaifenesin as well as dextromethorphan (Mucinex DM or Robitussin DM). Avoid any decongestants (anything that says "sinus", or has phenylephrine) If you develop sinus pain, use sinus rinses (Neti-pot or a sinus rinse kit, with distilled or boiled water, not taper). Use these once or twice daily, if needed.  Take the antiviral medication as indicated.  Contact us if you have persistent fever, worsening symptoms, chest pain, shortness of breath, pain with breathing, or other new/concerning symptoms.  Try and isolate yourself from your husband, if possible (and wear N95 masks around each other if you need to be in the same room.

## 2023-02-20 ENCOUNTER — Ambulatory Visit: Payer: Managed Care, Other (non HMO) | Admitting: Medical

## 2023-02-20 ENCOUNTER — Encounter: Payer: Self-pay | Admitting: Medical

## 2023-02-20 VITALS — BP 136/88 | HR 96 | Ht 62.5 in | Wt 210.6 lb

## 2023-02-20 DIAGNOSIS — I700 Atherosclerosis of aorta: Secondary | ICD-10-CM

## 2023-02-20 DIAGNOSIS — I10 Essential (primary) hypertension: Secondary | ICD-10-CM

## 2023-02-20 DIAGNOSIS — E785 Hyperlipidemia, unspecified: Secondary | ICD-10-CM | POA: Diagnosis not present

## 2023-02-20 DIAGNOSIS — N1832 Chronic kidney disease, stage 3b: Secondary | ICD-10-CM

## 2023-02-20 DIAGNOSIS — E1165 Type 2 diabetes mellitus with hyperglycemia: Secondary | ICD-10-CM | POA: Diagnosis not present

## 2023-02-20 DIAGNOSIS — K743 Primary biliary cirrhosis: Secondary | ICD-10-CM

## 2023-02-20 DIAGNOSIS — Z85850 Personal history of malignant neoplasm of thyroid: Secondary | ICD-10-CM

## 2023-02-20 DIAGNOSIS — M1A9XX0 Chronic gout, unspecified, without tophus (tophi): Secondary | ICD-10-CM

## 2023-02-20 DIAGNOSIS — E790 Hyperuricemia without signs of inflammatory arthritis and tophaceous disease: Secondary | ICD-10-CM

## 2023-02-20 LAB — POCT GLYCOSYLATED HEMOGLOBIN (HGB A1C): Hemoglobin A1C: 7.6 % — AB (ref 4.0–5.6)

## 2023-02-20 MED ORDER — COLCHICINE 0.6 MG PO TABS: 0.6000 mg | ORAL_TABLET | Freq: Every day | ORAL | 0 refills | Status: DC | PRN

## 2023-02-20 MED ORDER — SEMAGLUTIDE(0.25 OR 0.5MG/DOS) 2 MG/3ML ~~LOC~~ SOPN: 0.5000 mg | PEN_INJECTOR | SUBCUTANEOUS | 1 refills | Status: DC

## 2023-02-20 NOTE — Progress Notes (Signed)
I already put in her chart.

## 2023-02-20 NOTE — Progress Notes (Signed)
Subjective:  Lauren [name] is a 62 y.o. female who presents for Chief Complaint  Patient presents with   Hypertension    Fasting med check and needs new handicapped placard. No other concerns. Has eye exam scheduled tomorrow with [facility]. Also had covid booster 09/02/22.      Dr. Gean Quint, [facility] Dr. Roosevelt Locks, [facility] Don[place]e Jude, Bariatric clinic Dr. [place] Cellar, GI Dr. Chesley Mires, pulmonology Dr. Vanetta Mulders, orthopedics Milen Lengacher, Camelia Eng, PA-C here for primary care   Here for med check  Hypertension-compliant with amlodipine valsartan 5/160 mg daily.  BPs at home SBP 135-160, DBP 79-102.   Hyperlipidemia-compliant with atorvastatin 80 mg daily without complaint  Liver disease, sees gastro/liver clinic, on ursodiol 300 mg daily  Elevated uric acid, history of gout-compliant with allopurinol 100 mg daily.  No gout flare up in a month.  Uses colchicine periodically.  Diabetes-currently on Ozempic 0.'25mg'$  weekly, Farxiga '10mg'$  daily.    Checking sugars, sugars running 98-140.    Gets some occasional knee pain and swelling.  No other aggravating or relieving factors.    No other c/o.  Past Medical History:  Diagnosis Date   Anemia    Complication of anesthesia    History of thyroid cancer    Hyperlipidemia    Hypertension    PONV (postoperative nausea and vomiting)    Thyroid disease    Current Outpatient Medications on File Prior to Visit  Medication Sig Dispense Refill   allopurinol (ZYLOPRIM) 100 MG tablet Take 2 tablets (200 mg total) by mouth daily. 180 tablet 3   amLODipine-valsartan (EXFORGE) 5-160 MG tablet Take 1 tablet by mouth daily. 90 tablet 3   atorvastatin (LIPITOR) 80 MG tablet Take 1 tablet (80 mg total) by mouth daily. 90 tablet 3   Cholecalciferol (VITAMIN D) 50 MCG (2000 UT) CAPS Take 1 capsule (2,000 Units total) by mouth daily. 90 capsule 3   Cobalamin Combinations  (B-12) 1000-400 MCG SUBL Place 1 tablet under the tongue daily. 90 tablet 1   FARXIGA 10 MG TABS tablet Take 10 mg by mouth daily.     Insulin Pen Needle (BD PEN NEEDLE NANO U/F) 32G X 4 MM MISC 1 each by Does not apply route at bedtime. 200 each 2   LUMIGAN 0.01 % SOLN Place 1 drop into both eyes at bedtime.     Multiple Vitamin (MULTIVITAMIN) capsule Take 1 capsule by mouth daily.     Semaglutide,0.25 or 0.'5MG'$ /DOS, 2 MG/3ML SOPN Inject 0.25 mg into the skin once a week. 3 mL 1   ursodiol (ACTIGALL) 300 MG capsule Take 600 mg by mouth 2 (two) times daily.     colchicine 0.6 MG tablet Take 1-2 tablets (0.6-1.2 mg total) by mouth daily as needed (gout pain). Take 2 tablets at onset of gout pain.  Take an additional 1 tablet an hour later, if needed (Patient not taking: Reported on 12/21/2022) 90 tablet 0   Continuous Blood Gluc Sensor (FREESTYLE LIBRE 2 SENSOR) MISC Use on back of arm (Patient not taking: Reported on 02/20/2023) 6 each 3   tiZANidine (ZANAFLEX) 4 MG tablet TAKE 1 TABLET AT BEDTIME (Patient not taking: Reported on 02/20/2023) 90 tablet 0   No current facility-administered medications on file prior to visit.     The following portions of the patient's history were reviewed and updated as appropriate: allergies, current medications, past family history, past medical history, past social history, past surgical history  and problem list.  ROS Otherwise as in subjective above  Objective: BP 136/88   Pulse 96   Ht 5' 2.5" (1.588 m)   Wt 210 lb 9.6 oz (95.5 kg)   SpO2 98%   BMI 37.91 kg/m   BP Readings from Last 3 Encounters:  02/20/23 136/88  12/21/22 (!) 153/86  10/06/22 122/80   Wt Readings from Last 3 Encounters:  02/20/23 210 lb 9.6 oz (95.5 kg)  12/21/22 211 lb (95.7 kg)  10/06/22 218 lb 12.8 oz (99.2 kg)    General appearance: alert, no distress, well developed, well nourished Neck: supple, no lymphadenopathy, no thyromegaly, no masses Heart: RRR, normal S1, S2,  no murmurs Lungs: CTA bilaterally, no wheezes, rhonchi, or rales Pulses: 2+ radial pulses, 2+ pedal pulses, normal cap refill Ext: no edema  Diabetic Foot Exam - Simple   Simple Foot Form Diabetic Foot exam was performed with the following findings: Yes 02/20/2023  9:30 AM  Visual Inspection See comments: Yes Sensation Testing Intact to touch and monofilament testing bilaterally: Yes Pulse Check Posterior Tibialis and Dorsalis pulse intact bilaterally: Yes Comments Flat feet, otherwise no lesions       Assessment: Encounter Diagnoses  Name Primary?   Essential hypertension, benign Yes   Stage 3b chronic kidney disease ([place])    Hyperlipidemia, unspecified hyperlipidemia type    History of thyroid cancer    Elevated uric acid in blood    Type 2 diabetes mellitus with hyperglycemia, without long-term current use of insulin ([place])    Primary biliary cholangitis (HCC)    Aortic atherosclerosis ([place])      Plan: Hypertension and proteinuria Monitor your blood pressures and take your readings with you next week when you see the kidney doctor Continue amlodipine valsartan 5/160 mg daily, continue Farxiga 10 mg daily  Diabetes Hemoglobin A1c 7.6% today Continue Farxiga 10 mg daily increase Ozempic to 0.5 mg weekly Work on eating a low sugar diet, get exercise regularly See eye doctor tomorrow and make sure they send [place] a copy of your office note  CKD 3-follow-up with kidney doctor next week as planned.  Wilder Glade was added last visit due to proteinuria and kidney protection.  She was continued on her blood pressure medicine above.  I reviewed the notes and labs from December 2023 [place] kidney visit  Hyperlipidemia-continue atorvastatin 80 mg daily.  I reviewed labs from less than a year ago  Elevated uric acid, history of gout Continue allopurinol 100 mg daily Continue colchicine as needed Consider bumping up allopurinol 200 mg daily if she continues to get more frequent  flareups  I completed her handicap placard card today given history of gout and flareups that limit her ability to walk when she has a flareup  Obesity-she is seeing the bariatric clinic for dietary counseling and efforts to lose weight  Primary biliary cholangitis- on Urosodiol per liver clinic  she had a CT scan 02/04/2022.  At that time there was some findings of mild atherosclerotic calcifications in the thoracic aorta.  Continue statin  Yesli was seen today for hypertension.  Diagnoses and all orders for this visit:  Essential hypertension, benign  Stage 3b chronic kidney disease ([place])  Hyperlipidemia, unspecified hyperlipidemia type  History of thyroid cancer  Elevated uric acid in blood  Type 2 diabetes mellitus with hyperglycemia, without long-term current use of insulin (HCC) -     HgB A1c  Primary biliary cholangitis ([place])  Aortic atherosclerosis ([place])    Follow up:  with nephrology next week

## 2023-03-03 ENCOUNTER — Other Ambulatory Visit: Payer: Self-pay | Admitting: Medical

## 2023-03-11 IMAGING — CR DG FOOT COMPLETE 3+V*L*
3 series · 3 of 3 positions shown · non-contrast
Comparison: None.

CLINICAL DATA: Acute onset of left foot pain. No trauma. Possible
gout. Evaluate for fracture.

EXAM:
LEFT FOOT - COMPLETE 3+ VIEW

[t foot ap left]
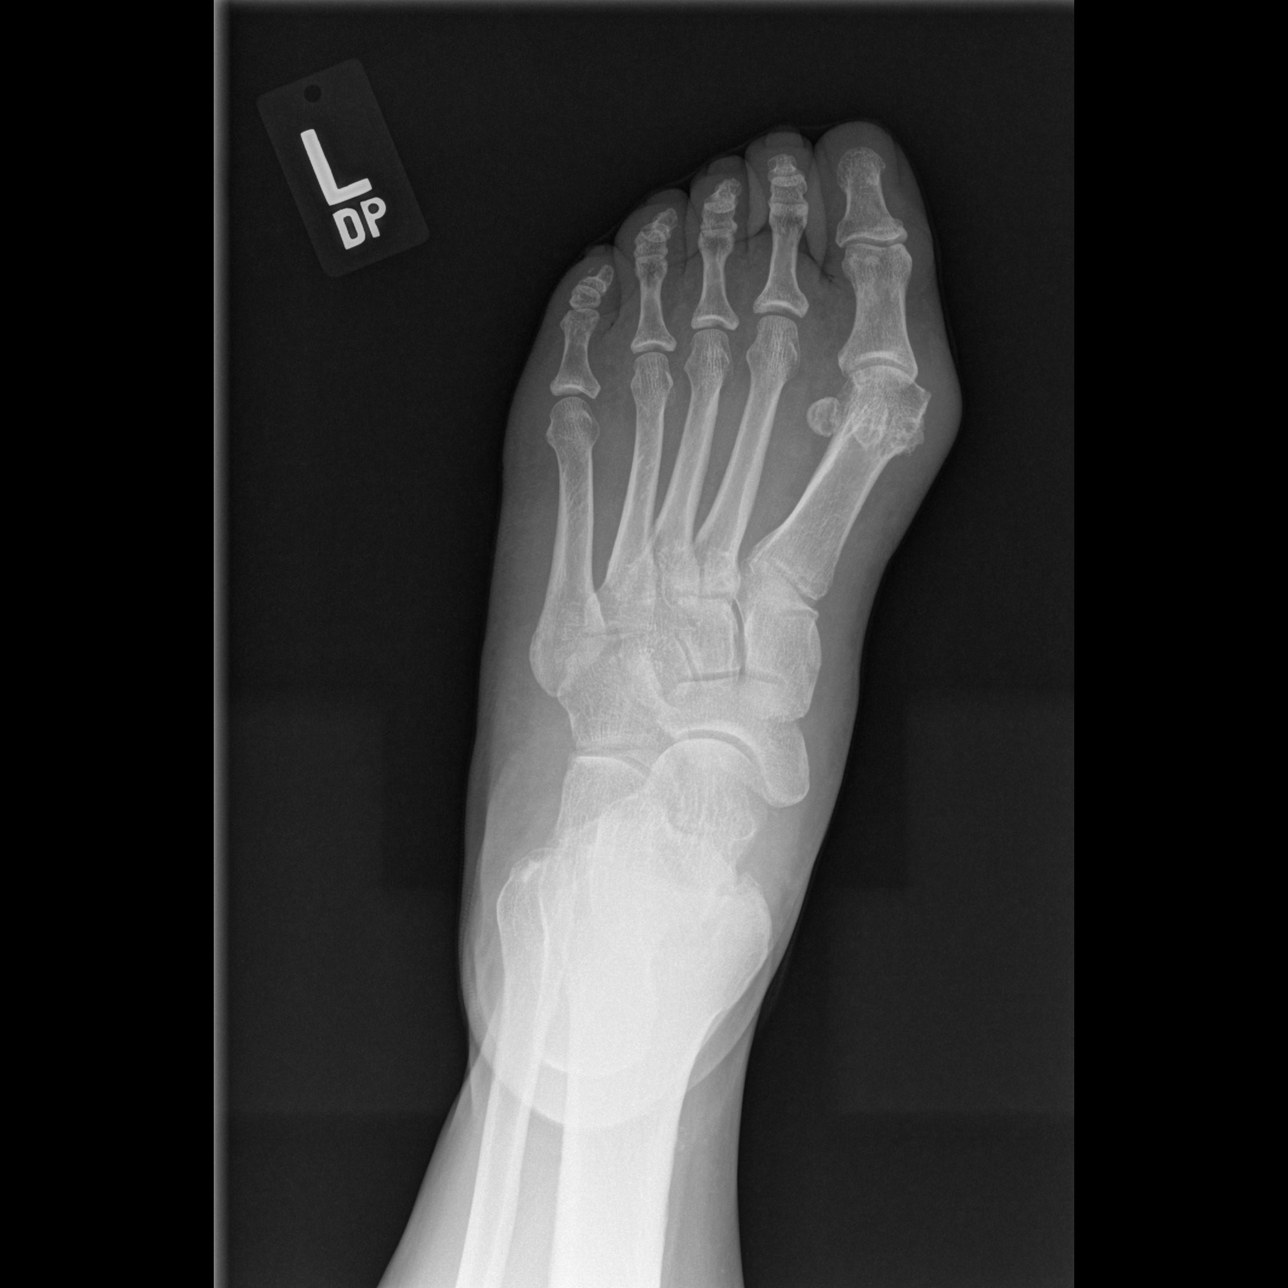

[t foot oblique left]
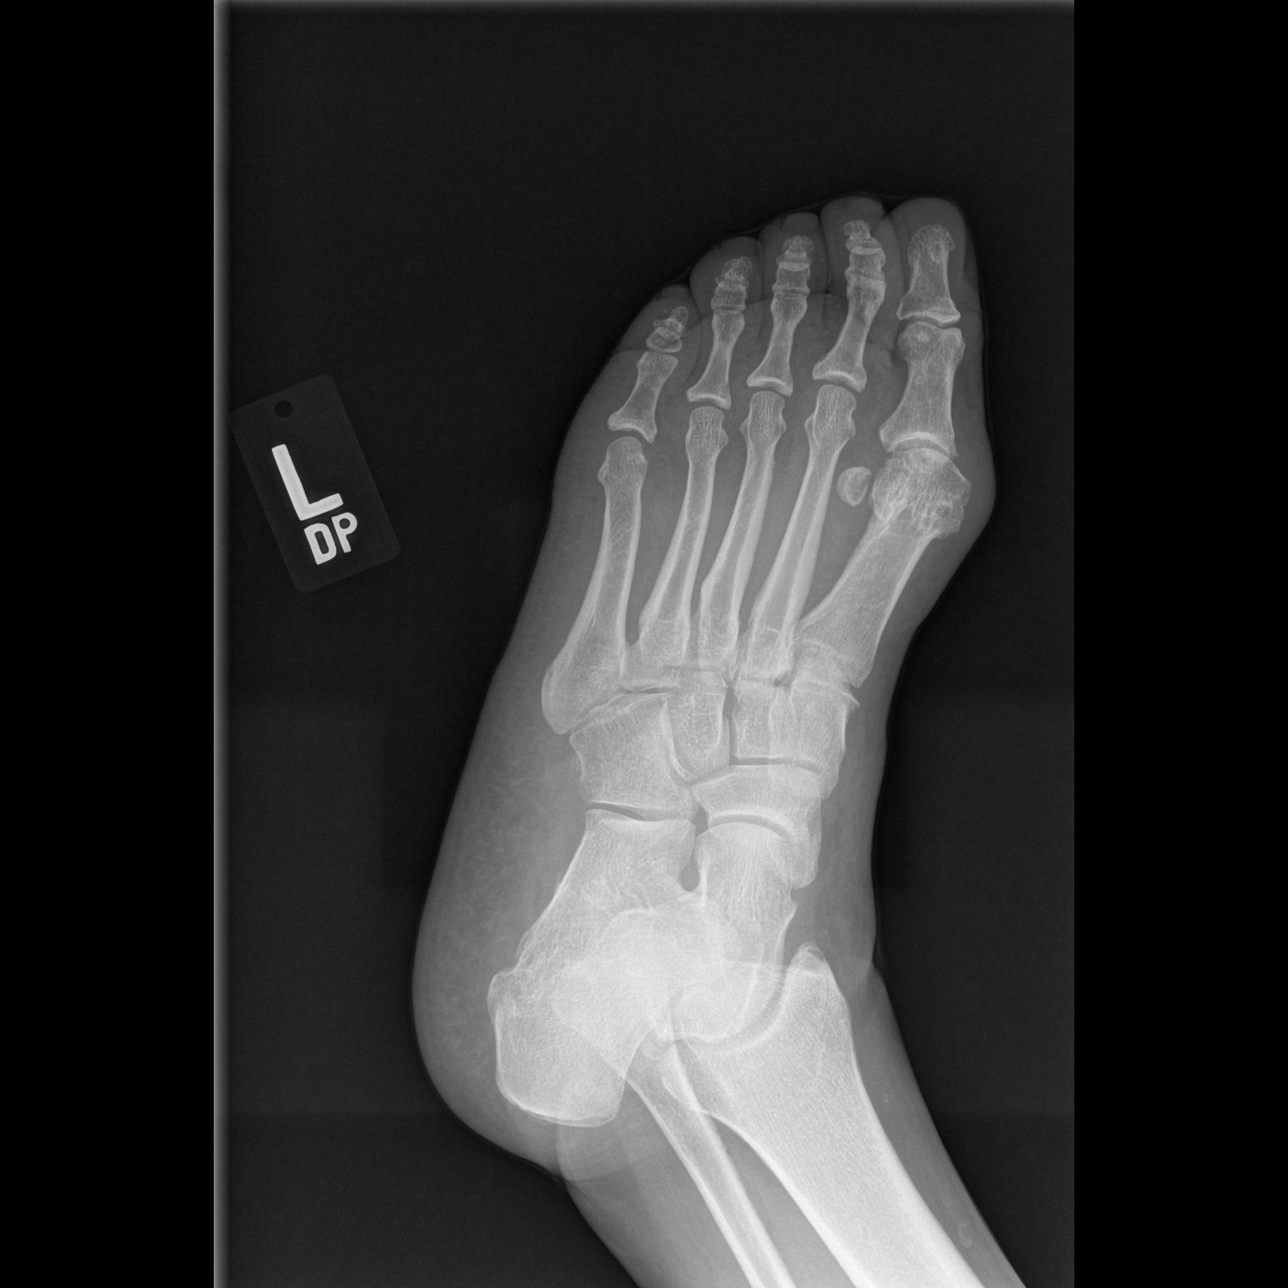

[t foot lat left]
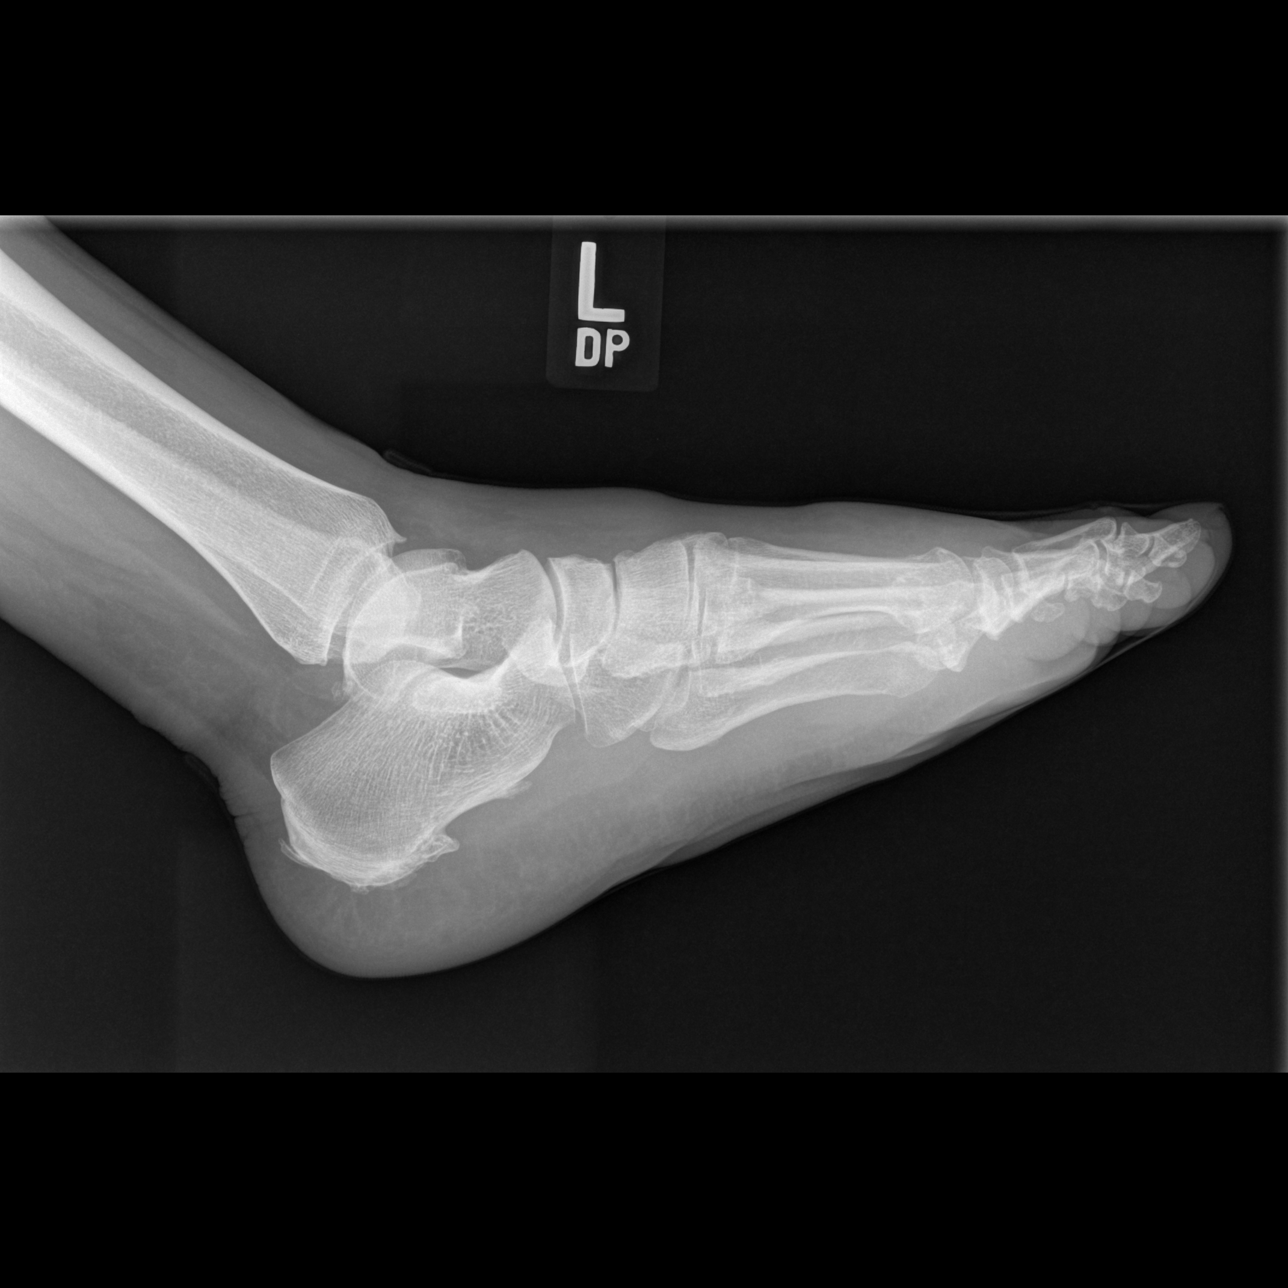

[3 of 3 positions shown; findings below may reference images not displayed]

FINDINGS: There is no evidence for acute fracture or dislocation. There are
mild degenerative changes at the first metatarsophalangeal joint
with joint space narrowing and osteophyte formation. There is
moderate hallux valgus. Soft tissues are within normal limits.
IMPRESSION: 1. No acute fracture or dislocation.
2. Mild degenerative changes at the first metatarsophalangeal joint
with hallux valgus.

## 2023-03-19 ENCOUNTER — Other Ambulatory Visit: Payer: Self-pay | Admitting: Medical

## 2023-03-19 MED ORDER — ROSUVASTATIN CALCIUM 40 MG PO TABS: 40.0000 mg | ORAL_TABLET | Freq: Every day | ORAL | 0 refills | Status: DC

## 2023-03-19 MED ORDER — TIZANIDINE HCL 4 MG PO TABS: 4.0000 mg | ORAL_TABLET | Freq: Every day | ORAL | 0 refills | Status: DC

## 2023-03-25 IMAGING — DX DG KNEE COMPLETE 4+V*L*
4 series · 4 of 4 positions shown · non-contrast
Comparison: None.

CLINICAL DATA: Left knee pain.

EXAM:
LEFT KNEE - COMPLETE 4+ VIEW

[knee ap]
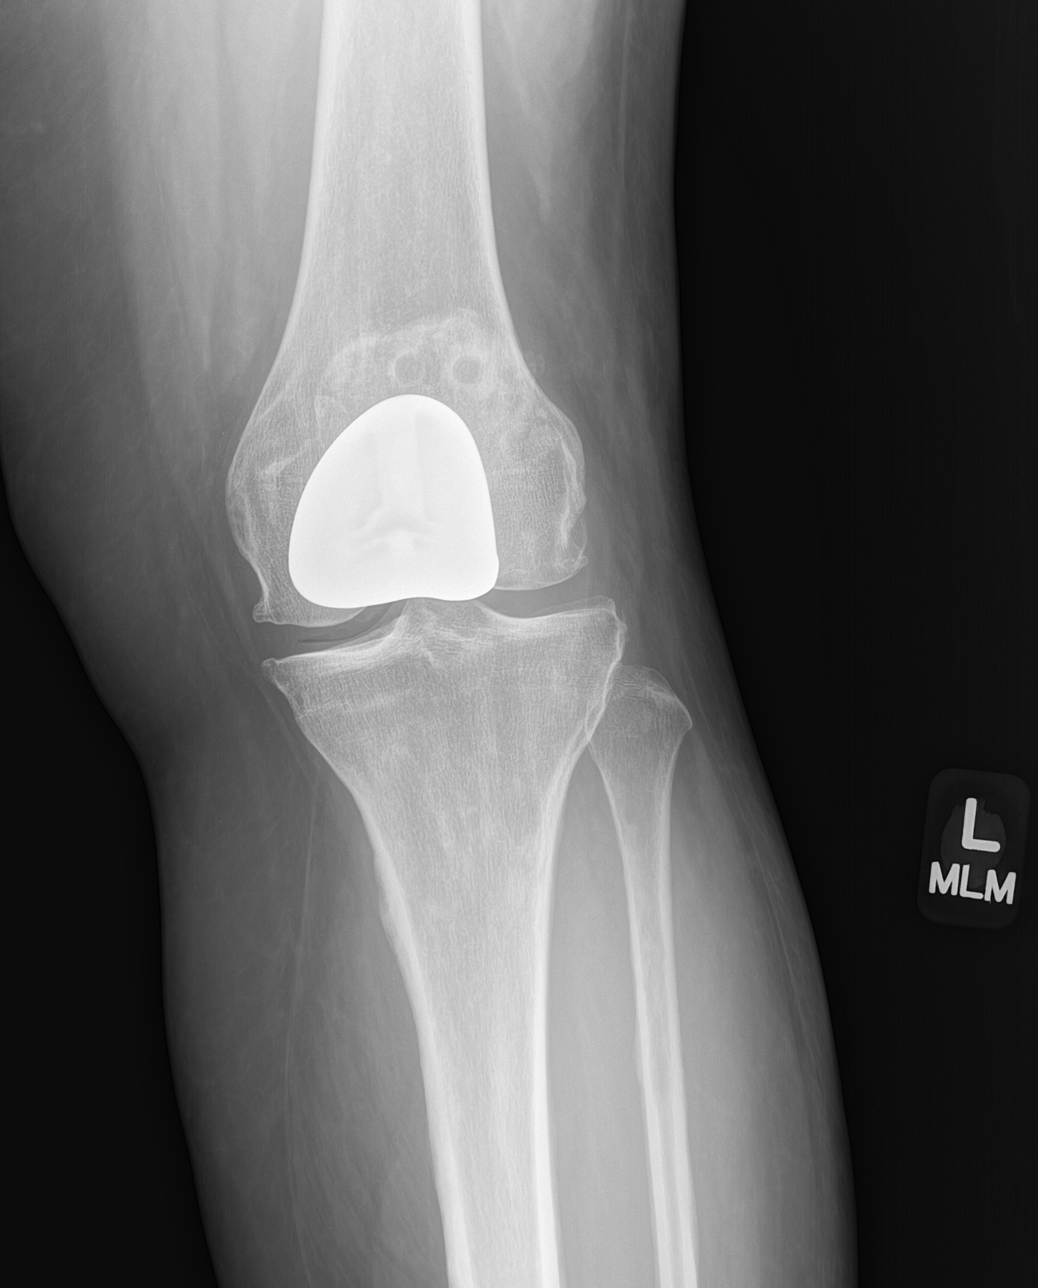

[knee lat]
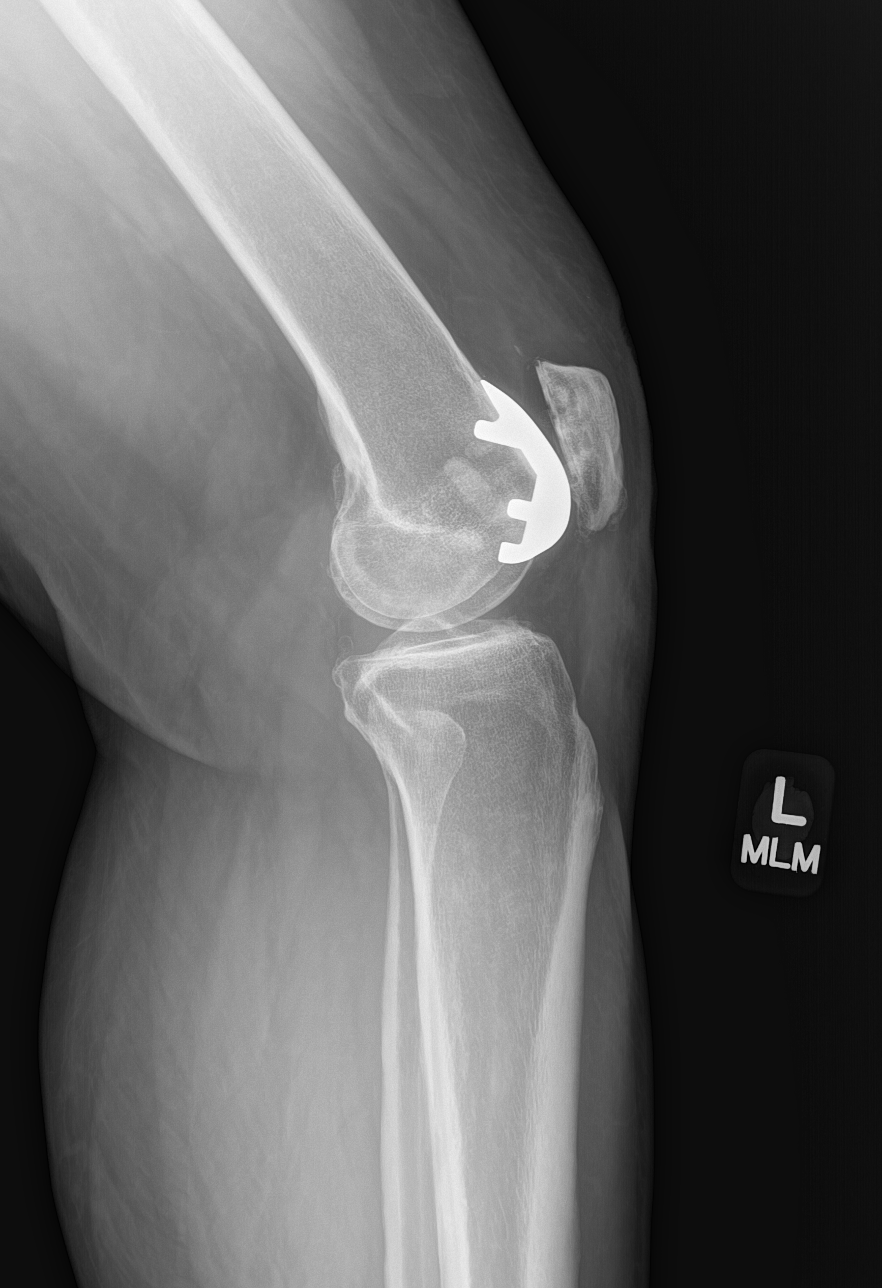

[patella skyline]
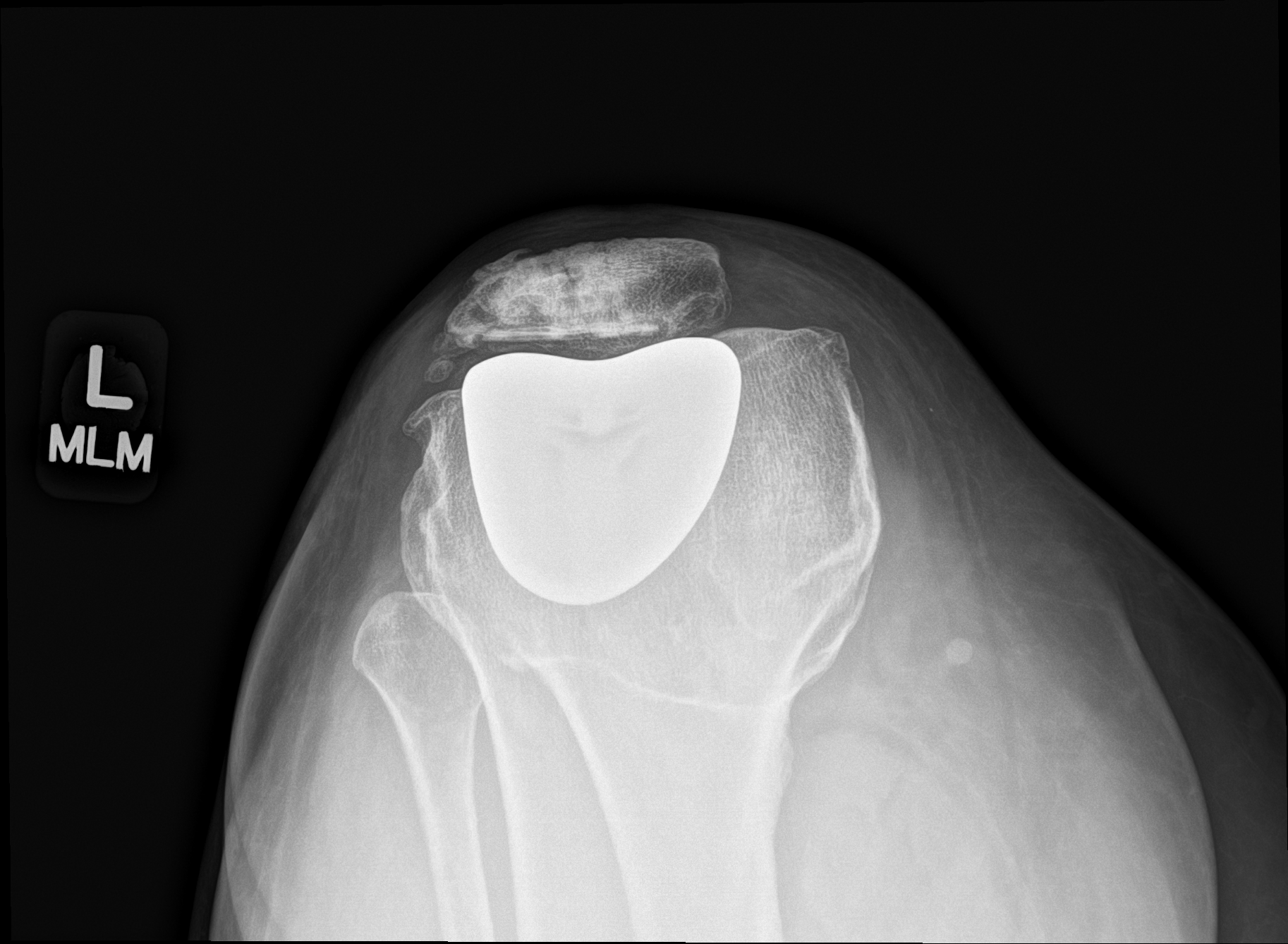

[knee tunnel]
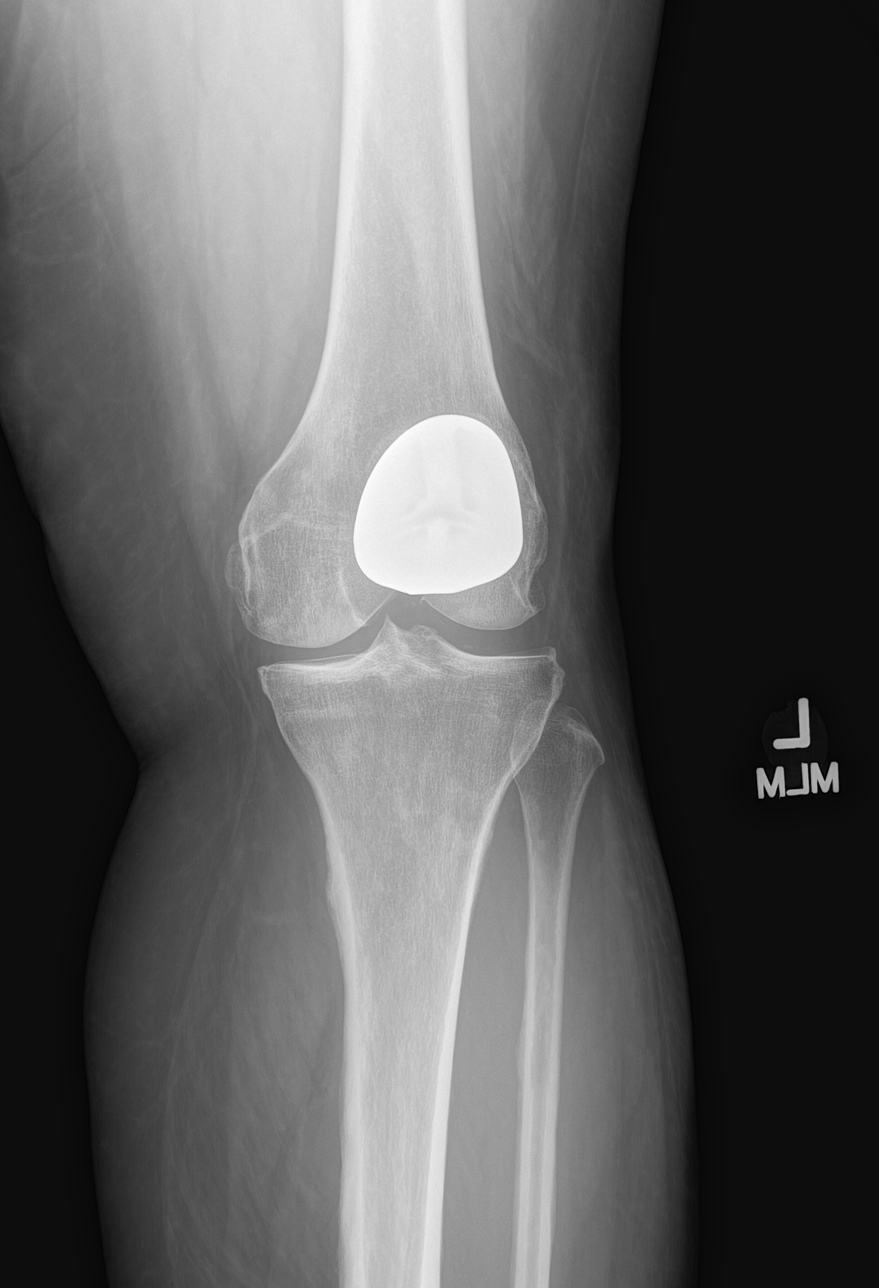

[4 of 4 positions shown; findings below may reference images not displayed]

FINDINGS: There is no acute fracture or dislocation. Prior arthroplasty of the
femoral articular surface with the patella. The hardware is intact.
No evidence of loosening. A crescentic lucency in the medial
compartment seen on the first AP image likely represents vacuum
phenomena within the meniscus associated with meniscal degeneration
or possible tear. There is a small suprapatellar effusion. The soft
tissues are unremarkable.
IMPRESSION: 1. No acute fracture or dislocation.
2. Prior arthroplasty of the anterior femoral articular surface. The
arthroplasty is intact.
3. Medial meniscal degeneration and vacuum phenomena.
4. Small suprapatellar effusion.

## 2023-04-13 ENCOUNTER — Ambulatory Visit (HOSPITAL_BASED_OUTPATIENT_CLINIC_OR_DEPARTMENT_OTHER): Payer: Managed Care, Other (non HMO)

## 2023-04-13 ENCOUNTER — Ambulatory Visit (HOSPITAL_BASED_OUTPATIENT_CLINIC_OR_DEPARTMENT_OTHER): Payer: Managed Care, Other (non HMO) | Admitting: Orthopaedic Surgery

## 2023-04-13 ENCOUNTER — Encounter (HOSPITAL_BASED_OUTPATIENT_CLINIC_OR_DEPARTMENT_OTHER): Payer: Self-pay | Admitting: Orthopaedic Surgery

## 2023-04-13 DIAGNOSIS — G8929 Other chronic pain: Secondary | ICD-10-CM

## 2023-04-13 DIAGNOSIS — M25561 Pain in right knee: Secondary | ICD-10-CM

## 2023-04-13 DIAGNOSIS — M25562 Pain in left knee: Secondary | ICD-10-CM

## 2023-04-13 NOTE — Progress Notes (Signed)
Chief Complaint: left knee     History of Present Illness:   04/13/2023: Presents today for follow-up of the left and right knee.  She is experiencing diffuse pain in the left knee.  She has not been able to get any type of pain relief on this left side.  She is experiencing swelling and clicking and popping in the knee with any type of activity.  She is here today for further discussion  Lauren [name] is a 62 y.o. female left knee pain status post patellofemoral knee replacement done in [place] 5 years prior.  She says that she has had pain following this although this is progressively worse over the last several years.  She endorses popping and clicking about the knee.  She has pain with weightbearing on the knee.  Her pain is worse at the end of a long day.  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  She has not tried any specific bracing.    Surgical History:   Left knee patellofemoral placement 2015  PMH/PSH/Family History/Social History/Meds/Allergies:    Past Medical History:  Diagnosis Date   Anemia    Complication of anesthesia    History of thyroid cancer    Hyperlipidemia    Hypertension    PONV (postoperative nausea and vomiting)    Thyroid disease    Past Surgical History:  Procedure Laterality Date   COLONOSCOPY     GASTRIC BYPASS     KNEE SURGERY Left 2017   LIVER BIOPSY  01/2022   Social History   Socioeconomic History   Marital status: Married    Spouse name: Not on file   Number of children: Not on file   Years of education: Not on file   Highest education level: Not on file  Occupational History   Not on file  Tobacco Use   Smoking status: Former    Packs/day: 1.25    Years: 22.00    Additional pack years: 0.00    Total pack years: 27.50    Types: E-cigarettes, Cigarettes    Quit date: 2002    Years since quitting: 22.3   Smokeless  tobacco: Never   Tobacco comments:    Stopped smoking 4yrs ago  Vaping Use   Vaping Use: Never used  Substance and Sexual Activity   Alcohol use: Yes    Comment: Occ.   Drug use: Not Currently    Comment: gummies   Sexual activity: Yes  Other Topics Concern   Not on file  Social History Narrative   Married.  Works for [employer].  Walking for exercise.   Social Determinants of Health   Financial Resource Strain: Not on file  Food Insecurity: Not on file  Transportation Needs: Not on file  Physical Activity: Not on file  Stress: Not on file  Social Connections: Not on file   Family History  Problem Relation Age of Onset   Breast cancer Mother    Breast cancer Maternal Grandmother    Diabetes Mellitus II Paternal Grandfather    Throat cancer Paternal Grandfather    Stomach cancer Neg Hx    Esophageal cancer Neg Hx    Pancreatic cancer Neg Hx    Colon cancer Neg Hx  Allergies  Allergen Reactions   Oxycodone Itching   Current Outpatient Medications  Medication Sig Dispense Refill   allopurinol (ZYLOPRIM) 100 MG tablet Take 2 tablets (200 mg total) by mouth daily. 180 tablet 3   amLODipine-valsartan (EXFORGE) 5-160 MG tablet Take 1 tablet by mouth daily. 90 tablet 3   Cholecalciferol (VITAMIN D) 50 MCG (2000 UT) CAPS Take 1 capsule (2,000 Units total) by mouth daily. 90 capsule 3   Cobalamin Combinations (B-12) 1000-400 MCG SUBL Place 1 tablet under the tongue daily. 90 tablet 1   colchicine 0.6 MG tablet Take 1-2 tablets (0.6-1.2 mg total) by mouth daily as needed (gout pain). Take 2 tablets at onset of gout pain.  Take an additional 1 tablet an hour later, if needed 90 tablet 0   Continuous Blood Gluc Sensor (FREESTYLE LIBRE 2 SENSOR) MISC Use on back of arm (Patient not taking: Reported on 02/20/2023) 6 each 3   FARXIGA 10 MG TABS tablet Take 10 mg by mouth daily.     Insulin Pen Needle (BD PEN NEEDLE NANO U/F) 32G X 4 MM MISC 1 each by Does not apply route at  bedtime. 200 each 2   LUMIGAN 0.01 % SOLN Place 1 drop into both eyes at bedtime.     Multiple Vitamin (MULTIVITAMIN) capsule Take 1 capsule by mouth daily.     rosuvastatin (CRESTOR) 40 MG tablet Take 1 tablet (40 mg total) by mouth daily. 90 tablet 0   Semaglutide,0.25 or 0.5MG /DOS, 2 MG/3ML SOPN Inject 0.5 mg into the skin once a week. 9 mL 1   tiZANidine (ZANAFLEX) 4 MG tablet Take 1 tablet (4 mg total) by mouth at bedtime. 90 tablet 0   ursodiol (ACTIGALL) 300 MG capsule Take 600 mg by mouth 2 (two) times daily.     No current facility-administered medications for this visit.   No results found.  Review of Systems:   A ROS was performed including pertinent positives and negatives as documented in the HPI.  Physical Exam :   Constitutional: NAD and appears stated age Neurological: Alert and oriented Psych: Appropriate affect and cooperative There were no vitals taken for this visit.   Comprehensive Musculoskeletal Exam:     Musculoskeletal Exam  Gait Normal  Alignment Normal   Right Left  Inspection Normal Normal  Palpation    Tenderness None Patellofemoral  Crepitus None Lateral patellar  Effusion None None  Range of Motion    Extension 0 0  Flexion 135 135  Strength    Extension 5/5 5/5  Flexion 5/5 5/5  Ligament Exam     Generalized Laxity No No  Lachman Negative Negative   Pivot Shift Negative Negative  Anterior Drawer Negative Negative  Valgus at 0 Negative Negative  Valgus at 20 Negative Negative  Varus at 0 0 0  Varus at 20   0 0  Posterior Drawer at 90 0 0  Vascular/Lymphatic Exam    Edema None None  Venous Stasis Changes No No  Distal Circulation Normal Normal  Neurologic    Light Touch Sensation Intact Intact  Special Tests:      Imaging:   Xray (4 views left knee): She has mild to moderate arthritis involving the predominantly medial compartment.  There is lateral patellar tilt with significant osteophyte formation about the lateral  patellofemoral joint   I personally reviewed and interpreted the radiographs.   Assessment:   62 year old female with a history of left patellofemoral replacement in 2015.  Overall she  continues to have persistent pain.  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  At this time given the fact that she has trialed conservative management with bracing as well as inability to take NSAIDs but with activity restriction, I do believe that she may be a candidate for conversion to total knee arthroplasty.  I do believe that she is somewhat overstressing this right side due to left knee pain and weakness.  At this time I would like to plan to refer her to Dr. Warren Danes for discussion of possible conversion of her left patellofemoral arthroplasty to a total knee arthroplasty Plan :    -Plan for referral to Dr. Roda Shutters to see if she is a candidate for conversion from partial knee arthroplasty in the patellofemoral joint to total knee arthroplasty     I personally saw and evaluated the patient, and participated in the management and treatment plan.  Huel Cote, MD Attending Physician, Orthopedic Surgery  This document was dictated using Dragon voice recognition software. A reasonable attempt at proof reading has been made to minimize errors.

## 2023-04-19 ENCOUNTER — Encounter: Payer: Self-pay | Admitting: Orthopaedic Surgery

## 2023-04-19 ENCOUNTER — Ambulatory Visit: Payer: Managed Care, Other (non HMO) | Admitting: Orthopaedic Surgery

## 2023-04-19 VITALS — Ht 61.0 in | Wt 207.0 lb

## 2023-04-19 DIAGNOSIS — M1712 Unilateral primary osteoarthritis, left knee: Secondary | ICD-10-CM

## 2023-04-19 LAB — HM MAMMOGRAPHY

## 2023-04-19 NOTE — Progress Notes (Signed)
Office Visit Note   Patient: Lauren [name]           Date of Birth: 10/14/1961           MRN: [id number] Visit Date: 04/19/2023              Requested by: Huel Cote, MD [address] [place],  [place] [address] PCP: Jac Canavan, PA-C   Assessment & Plan: Visit Diagnoses:  1. Primary osteoarthritis of left knee     Plan: Impression is 62 year old female with chronic left knee pain status post patellofemoral arthroplasty about 5 years ago in [place].  Based on x-rays it does appear that there is progression of her femoral-tibial arthritis.  The joint spaces are still well-preserved.  She is reporting some mechanical symptoms.  I would like to get a [facility] MRI to good for evaluation assessment of the extent of her DJD and to rule out structural abnormalities.  Follow-up after the MRI.  Follow-Up Instructions: No follow-ups on file.   Orders:  No orders of the defined types were placed in this encounter.  No orders of the defined types were placed in this encounter.     Procedures: No procedures performed   Clinical Data: No additional findings.   Subjective: Chief Complaint  Patient presents with   Left Knee - Pain    HPI Lauren [name] is a very pleasant 62 year old female referral from Dr. Morey Hummingbird for left knee pain.  Patient underwent patellofemoral arthroplasty in [place] about 5 years ago.  States that this did not really improve her symptoms.  She has global pain that is worse with activity.  She did not have any postoperative complications.  She was sent here to discuss conversion to total knee replacement. Review of Systems  Constitutional: Negative.   HENT: Negative.    Eyes: Negative.   Respiratory: Negative.    Cardiovascular: Negative.   Endocrine: Negative.   Musculoskeletal: Negative.   Neurological: Negative.   Hematological: Negative.   Psychiatric/Behavioral: Negative.    All other systems reviewed and are  negative.    Objective: Vital Signs: Ht 5\' 1"  (1.549 m)   Wt 207 lb (93.9 kg)   BMI 39.11 kg/m   Physical Exam Vitals and nursing note reviewed.  Constitutional:      Appearance: She is well-developed.  HENT:     Head: Atraumatic.     Nose: Nose normal.  Eyes:     Extraocular Movements: Extraocular movements intact.  Cardiovascular:     Pulses: Normal pulses.  Pulmonary:     Effort: Pulmonary effort is normal.  Abdominal:     Palpations: Abdomen is soft.  Musculoskeletal:     Cervical back: Neck supple.  Skin:    General: Skin is warm.     Capillary Refill: Capillary refill takes less than 2 seconds.  Neurological:     Mental Status: She is alert. Mental status is at baseline.  Psychiatric:        Behavior: Behavior normal.        Thought Content: Thought content normal.        Judgment: Judgment normal.     Ortho Exam Examination left knee shows fully healed surgical scars.  She has good range of motion and there is mild patellofemoral crepitus.  Collaterals and cruciates are stable.  Medial and lateral joint line tenderness.  She has global pain around the knee. Specialty Comments:  No specialty comments available.  Imaging: No results found.  PMFS History: Patient Active Problem List   Diagnosis Date Noted   Type 2 diabetes mellitus with hyperglycemia, without long-term current use of insulin (HCC) 02/20/2023   Aortic atherosclerosis (HCC) 02/20/2023   Primary biliary cholangitis (HCC) 10/06/2022   Kidney stone 06/30/2022   Encounter for health maintenance examination in adult 06/30/2022   History of gastric bypass 06/30/2022   Anemia 06/30/2022   Altered mental status 06/30/2022   Nausea and vomiting 06/30/2022   Other constipation 02/14/2022   RUQ pain 02/14/2022   Hip fracture (HCC) 02/06/2022   Liver hematoma 02/05/2022   Elevated LFTs 01/12/2022   Allergic rhinitis due to pollen 09/12/2021   Pain in joints of both feet 09/12/2021   OSA  (obstructive sleep apnea) 09/12/2021   Stage 3b chronic kidney disease (HCC) 07/18/2021   Elevated alkaline phosphatase level 07/18/2021   Elevated liver enzymes 07/18/2021   Chronic neck pain 07/13/2021   Chronic back pain 07/13/2021   Urge incontinence of urine 07/13/2021   Incontinence of feces 07/13/2021   Eczema 07/13/2021   Paresthesia of arm 07/13/2021   H/O left knee surgery 07/13/2021   History of thyroid cancer 07/13/2021   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   Hyperlipidemia 07/13/2021   Essential hypertension, benign 07/13/2021   Elevated uric acid in blood 07/13/2021   Insomnia 07/13/2021   Elevated serum creatinine 07/13/2021   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   Past Medical History:  Diagnosis Date   Anemia    Complication of anesthesia    History of thyroid cancer    Hyperlipidemia    Hypertension    PONV (postoperative nausea and vomiting)    Thyroid disease     Family History  Problem Relation Age of Onset   Breast cancer Mother    Breast cancer Maternal Grandmother    Diabetes Mellitus II Paternal Grandfather    Throat cancer Paternal Grandfather    Stomach cancer Neg Hx    Esophageal cancer Neg Hx    Pancreatic cancer Neg Hx    Colon cancer Neg Hx     Past Surgical History:  Procedure Laterality Date   COLONOSCOPY     GASTRIC BYPASS     KNEE SURGERY Left 2017   LIVER BIOPSY  01/2022   Social History   Occupational History   Not on file  Tobacco Use   Smoking status: Former    Packs/day: 1.25    Years: 22.00    Additional pack years: 0.00    Total pack years: 27.50    Types: E-cigarettes, Cigarettes    Quit date: 2002    Years since quitting: 22.3   Smokeless tobacco: Never   Tobacco comments:    Stopped smoking 72yrs ago  Vaping Use   Vaping Use: Never used  Substance and Sexual Activity   Alcohol use: Yes    Comment: Occ.   Drug use: Not Currently    Comment: gummies   Sexual activity: Yes

## 2023-04-23 ENCOUNTER — Encounter: Payer: Self-pay | Admitting: Internal Medicine

## 2023-04-24 ENCOUNTER — Encounter (HOSPITAL_BASED_OUTPATIENT_CLINIC_OR_DEPARTMENT_OTHER): Payer: Self-pay | Admitting: Pulmonary Disease

## 2023-04-24 ENCOUNTER — Ambulatory Visit (HOSPITAL_BASED_OUTPATIENT_CLINIC_OR_DEPARTMENT_OTHER): Payer: Managed Care, Other (non HMO) | Admitting: Pulmonary Disease

## 2023-04-24 ENCOUNTER — Ambulatory Visit (HOSPITAL_BASED_OUTPATIENT_CLINIC_OR_DEPARTMENT_OTHER): Payer: Managed Care, Other (non HMO)

## 2023-04-24 VITALS — BP 144/92 | HR 99 | Temp 99.3°F | Ht 61.5 in | Wt 212.6 lb

## 2023-04-24 DIAGNOSIS — Z862 Personal history of diseases of the blood and blood-forming organs and certain disorders involving the immune mechanism: Secondary | ICD-10-CM | POA: Diagnosis not present

## 2023-04-24 DIAGNOSIS — G4733 Obstructive sleep apnea (adult) (pediatric): Secondary | ICD-10-CM | POA: Diagnosis not present

## 2023-04-24 NOTE — Progress Notes (Signed)
Lauren [name], Lauren [name], Lauren [name]  Chief Complaint  Patient presents with   Follow-up    Follow up. Patient has no complaints.     Past Surgical History:  She  has a past surgical history that includes Gastric bypass; Knee surgery (Left, 2017); Colonoscopy; Lauren Liver biopsy (01/2022).  Past Medical History:  Anemia, Thyroid cancer, HLD, HTN, Gout, DM type 2, Reflux, Sarcoidosis, Eczema, Glaucoma, CKD 3b, Primary biliary cholangitis, Fatty liver  Constitutional:  BP (!) 144/92 (BP Location: Right Arm, Patient Position: Sitting, Cuff Size: Normal)   Pulse 99   Temp 99.3 F (37.4 C) (Oral)   Ht 5' 1.5" (1.562 m)   Wt 212 lb 9.6 oz (96.4 kg)   SpO2 99%   BMI 39.52 kg/m   Brief Summary:  Lauren [name] is a 62 y.o. female with obstructive sleep apnea.      Subjective:   She reports using CPAP nightly.  No issues with her mask fit or pressure setting.  Feels rested.  Goes to bed at 7 pm Lauren wakes up at 330 am to see her husband off to work.  She doesn't need to nap during the day.  She thinks her CPAP is about 44 or 62 years old.  She has a history of sarcoidosis.  She had swelling in her lymph nodes around her eyes Lauren her neck.  She had neck lymph node biopsy that was positive for sarcoidosis.  This was in the 1990's.  She was treated with high dose prednisone for years.  She doesn't recall having [name] involvement with her sarcoidosis.  She doesn't have cough, wheeze, sputum, chest pain, or shortness of breath with activity.  She has a family history of sarcoidosis.  Her kidney Lauren liver doctors told her she needs to make sure sarcoid hasn't come back Lauren contributing to her kidney Lauren liver disease.  Physical Exam:   Appearance - well kempt   ENMT - no sinus tenderness, no oral exudate, no LAN, Mallampati 3 airway, no stridor  Respiratory - equal breath sounds bilaterally, no wheezing or rales  CV - s1s2 regular rate Lauren rhythm, no murmurs  Ext - no  clubbing, no edema  Skin - no rashes  Psych - normal mood Lauren affect    Sleep Tests:  PSG 11/26/08 >> AHI 19.4, SpO2 low 80% CPAP 12/25/21 to 01/23/22 >> used on 8 of 30 nights with average 5 hrs 45 min. Average AHI 0.2 with CPAP 10 cm H2O.   Social History:  She  reports that she quit smoking about 22 years ago. Her smoking use included e-cigarettes Lauren cigarettes. She has a 27.50 pack-year smoking history. She has never used smokeless tobacco. She reports current alcohol use. She reports that she does not currently use drugs.  Family History:  Her family history includes Breast cancer in her maternal grandmother Lauren mother; Diabetes Mellitus II in her paternal grandfather; Throat cancer in her paternal grandfather.     Assessment/Plan:   Obstructive sleep apnea. - she is compliant with CPAP Lauren reports benefit from therapy - she uses [employer] for her DME - her current CPAP is more than 62 yrs old - will arrange for a new Resmed CPAP at 10 cm H2O  History of sarcoidosis. - this was from the 1990's - she does not have significant [name] symptoms  - will arrange for a chest xray; if this is negative, then she doesn't need any further [name] assessment for sarcoidosis - advised  her to follow up with her hepatologist Lauren nephrologist if there is clinical concern from them for extrapulmonary sarcoidosis  Advanced sleep phase. - related to her husbands work schedule Lauren how this impacts her schedule - she is getting enough hours of sleep  Primary biliary cholangitis. - followed by [name] Lauren [facility]  Time Spent Involved in Patient [name] on Day of Examination:  38 minutes  Follow up:   Patient Instructions  Chest xray today.  Will have [facility] arrange for a new CPAP machine.  Follow up in 4 months.  Medication List:   Allergies as of 04/24/2023       Reactions   Oxycodone Itching        Medication List        Accurate as of Apr 24, 2023  2:57 PM.  If you have any questions, ask your nurse or doctor.          allopurinol 100 MG tablet Commonly known as: ZYLOPRIM Take 2 tablets (200 mg total) by mouth daily.   amLODipine-valsartan 5-160 MG tablet Commonly known as: EXFORGE Take 1 tablet by mouth daily.   B-12 1000-400 MCG Subl Place 1 tablet under the tongue daily.   BD Pen Needle Nano U/F 32G X 4 MM Misc Generic drug: Insulin Pen Needle 1 each by Does not apply route at bedtime.   colchicine 0.6 MG tablet Take 1-2 tablets (0.6-1.2 mg total) by mouth daily as needed (gout pain). Take 2 tablets at onset of gout pain.  Take an additional 1 tablet an hour later, if needed   Farxiga 10 MG Tabs tablet Generic drug: dapagliflozin propanediol Take 10 mg by mouth daily.   FreeStyle Libre 2 Sensor Misc Use on back of arm   Kerendia 10 MG Tabs Generic drug: Finerenone   Lumigan 0.01 % Soln Generic drug: bimatoprost Place 1 drop into both eyes at bedtime.   multivitamin capsule Take 1 capsule by mouth daily.   rosuvastatin 40 MG tablet Commonly known as: Crestor Take 1 tablet (40 mg total) by mouth daily.   Semaglutide(0.25 or 0.5MG /DOS) 2 MG/3ML Sopn Inject 0.5 mg into the skin once a week.   tiZANidine 4 MG tablet Commonly known as: ZANAFLEX Take 1 tablet (4 mg total) by mouth at bedtime.   ursodiol 300 MG capsule Commonly known as: ACTIGALL Take 600 mg by mouth 2 (two) times daily.   Vitamin D 50 MCG (2000 UT) Caps Take 1 capsule (2,000 Units total) by mouth daily.        Signature:  Coralyn Helling, MD [facility] [name]/Lauren [name] Pager - [phone] 04/24/2023, 2:57 PM

## 2023-04-24 NOTE — Patient Instructions (Signed)
Chest xray today.  Will have [facility] arrange for a new CPAP machine.  Follow up in 4 months.

## 2023-05-05 ENCOUNTER — Other Ambulatory Visit: Payer: Managed Care, Other (non HMO)

## 2023-05-14 ENCOUNTER — Encounter (HOSPITAL_BASED_OUTPATIENT_CLINIC_OR_DEPARTMENT_OTHER): Payer: Self-pay | Admitting: Pulmonary Disease

## 2023-05-15 NOTE — Telephone Encounter (Signed)
Mychart message sent by pt:  Lauren [name]  P [facility] Clinical Pool (supporting Coralyn Helling, MD)Yesterday (5:09 AM)   AN I received the attached letter from my [employer].  Can you assist with their request?   Please let me know if you can send them the requested info or if I need to submit myself.   Thank you in advance for your assistance.  Greatly appreciated.  Attachments  [employer] front.PDF   [employer] back.PDF    Routing to both Dr. Craige Cotta and Raven for review. You can see the attached documents in media section of pt's chart.

## 2023-05-30 ENCOUNTER — Telehealth: Payer: Self-pay | Admitting: Orthopaedic Surgery

## 2023-05-30 NOTE — Telephone Encounter (Signed)
Pt called in MRI was cancelled waiting on Auth please advise

## 2023-05-30 NOTE — Telephone Encounter (Signed)
I called [employer] and MRI does not require authorization Ref # [id number]- called pt to inform her of this and to contact imaging to reschedule

## 2023-06-06 ENCOUNTER — Ambulatory Visit
Admission: RE | Admit: 2023-06-06 | Discharge: 2023-06-06 | Disposition: A | Discharge status: Home / Self Care | Payer: Managed Care, Other (non HMO) | Source: Ambulatory Visit | Attending: Orthopaedic Surgery | Admitting: Orthopaedic Surgery

## 2023-06-06 DIAGNOSIS — M1712 Unilateral primary osteoarthritis, left knee: Secondary | ICD-10-CM

## 2023-06-13 NOTE — Progress Notes (Signed)
Needs f/u appt 

## 2023-06-21 ENCOUNTER — Ambulatory Visit: Payer: Managed Care, Other (non HMO) | Admitting: Physician Assistant

## 2023-06-21 ENCOUNTER — Telehealth: Payer: Self-pay | Admitting: Physician Assistant

## 2023-06-21 ENCOUNTER — Encounter: Payer: Self-pay | Admitting: Physician Assistant

## 2023-06-21 ENCOUNTER — Other Ambulatory Visit (INDEPENDENT_AMBULATORY_CARE_PROVIDER_SITE_OTHER): Payer: Managed Care, Other (non HMO)

## 2023-06-21 DIAGNOSIS — M25562 Pain in left knee: Secondary | ICD-10-CM

## 2023-06-21 DIAGNOSIS — G8929 Other chronic pain: Secondary | ICD-10-CM

## 2023-06-21 NOTE — Telephone Encounter (Signed)
I saw this patient today to discuss left knee mri.  I am not convinced that her knee pain is coming from her knee.  Would you be able to look at scans and tell me what you think?

## 2023-06-21 NOTE — Progress Notes (Signed)
Office Visit Note   Patient: Lauren [name]           Date of Birth: Nov 26, 1961           MRN: [id number] Visit Date: 06/21/2023              Requested by: Jac Canavan, PA-C [address] [place],  [place] [address] PCP: Jac Canavan, PA-C   Assessment & Plan: Visit Diagnoses:  1. Chronic pain of left knee     Plan: Impression is chronic left knee pain with recent MRI findings suggestive of mild medial and lateral compartment OA.  At this point, I am really not certain that her knee pain is coming from her knee given the mild degenerative changes seen on recent MRI.  I am worried that some of her pain could be referred from her hip or her back.  I would like to further discuss this with Dr.Xu.  Patient understands and agrees.  Follow-Up Instructions: Return if symptoms worsen or fail to improve.   Orders:  Orders Placed This Encounter  Procedures   XR Pelvis 1-2 Views   No orders of the defined types were placed in this encounter.     Procedures: No procedures performed   Clinical Data: No additional findings.   Subjective: Chief Complaint  Patient presents with   Left Knee - Follow-up    MRI review    HPI patient is a pleasant 62 year old female with chronic left knee pain who comes in today to discuss MRI results of her left knee.  She is status post left knee patellofemoral replacement back in [place] 5 to 6 years ago.  She notes worsening symptoms since having the replacement surgery.  She denies any relief in pain from previous cortisone injection even during the anesthetic phase prior to surgical intervention.  She does note paresthesias to the left knee and exquisite sensitivity.  She tells me she has also been having pain to the anterior thigh.  No pain into the groin.  History of lumbar pain as well without radiation down either leg.  Recent MRI of the left knee shows mild degenerative changes to the medial and lateral compartments.  No other  structural abnormalities.  Review of Systems as detailed in HPI.  All others reviewed and are negative.   Objective: Vital Signs: There were no vitals taken for this visit.  Physical Exam well-developed well-nourished female no acute distress.  Alert and oriented x 3.  Ortho Exam unchanged left knee exam.  Left hip exam: No pain with logroll, FADIR or Stinchfield testing.  No focal weakness.  She is neurovascularly intact distally.  Specialty Comments:  No specialty comments available.  Imaging: XR Pelvis 1-2 Views  Result Date: 06/21/2023 X-rays demonstrate mild degenerative changes to the left hip joint.    PMFS History: Patient Active Problem List   Diagnosis Date Noted   Type 2 diabetes mellitus with hyperglycemia, without long-term current use of insulin (HCC) 02/20/2023   Aortic atherosclerosis (HCC) 02/20/2023   Primary biliary cholangitis (HCC) 10/06/2022   Kidney stone 06/30/2022   Encounter for health maintenance examination in adult 06/30/2022   History of gastric bypass 06/30/2022   Anemia 06/30/2022   Altered mental status 06/30/2022   Nausea and vomiting 06/30/2022   Other constipation 02/14/2022   RUQ pain 02/14/2022   Hip fracture (HCC) 02/06/2022   Liver hematoma 02/05/2022   Elevated LFTs 01/12/2022   Allergic rhinitis due to pollen 09/12/2021   Pain  in joints of both feet 09/12/2021   OSA (obstructive sleep apnea) 09/12/2021   Stage 3b chronic kidney disease (HCC) 07/18/2021   Elevated alkaline phosphatase level 07/18/2021   Elevated liver enzymes 07/18/2021   Chronic neck pain 07/13/2021   Chronic back pain 07/13/2021   Urge incontinence of urine 07/13/2021   Incontinence of feces 07/13/2021   Eczema 07/13/2021   Paresthesia of arm 07/13/2021   H/O left knee surgery 07/13/2021   History of thyroid cancer 07/13/2021   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   Hyperlipidemia 07/13/2021   Essential hypertension, benign 07/13/2021   Elevated uric  acid in blood 07/13/2021   Insomnia 07/13/2021   Elevated serum creatinine 07/13/2021   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   Past Medical History:  Diagnosis Date   Anemia    CKD stage 3b, GFR 30-44 ml/min (HCC)    Complication of anesthesia    Diabetes mellitus, type 2 (HCC)    Eczema    GERD (gastroesophageal reflux disease)    Glaucoma    Gout    Hyperlipidemia    Hypertension    Metabolic dysfunction-associated steatohepatitis (MASH)    PONV (postoperative nausea and vomiting)    Primary biliary cholangitis (HCC)    Sarcoidosis    Thyroid cancer (HCC)     Family History  Problem Relation Age of Onset   Breast cancer Mother    Breast cancer Maternal Grandmother    Diabetes Mellitus II Paternal Grandfather    Throat cancer Paternal Grandfather    Stomach cancer Neg Hx    Esophageal cancer Neg Hx    Pancreatic cancer Neg Hx    Colon cancer Neg Hx     Past Surgical History:  Procedure Laterality Date   COLONOSCOPY     GASTRIC BYPASS     KNEE SURGERY Left 2017   LIVER BIOPSY  01/2022   Social History   Occupational History   Not on file  Tobacco Use   Smoking status: Former    Current packs/day: 0.00    Average packs/day: 1.3 packs/day for 22.0 years (27.5 ttl pk-yrs)    Types: E-cigarettes, Cigarettes    Start date: 24    Quit date: 2002    Years since quitting: 22.5   Smokeless tobacco: Never   Tobacco comments:    Stopped smoking 56yrs ago  Vaping Use   Vaping status: Never Used  Substance and Sexual Activity   Alcohol use: Yes    Comment: Occ.   Drug use: Not Currently    Comment: gummies   Sexual activity: Yes

## 2023-06-22 NOTE — Telephone Encounter (Signed)
Difficult to say because it's hard to see anything around the prosthesis so I can't see the cartilage surfaces.

## 2023-06-25 NOTE — Telephone Encounter (Signed)
Yeah let's do that.

## 2023-06-25 NOTE — Telephone Encounter (Signed)
Can you let this patient know that I spoke to dr. Roda Shutters and he would like for her to come in to see him

## 2023-06-25 NOTE — Telephone Encounter (Signed)
Want me to have her come back in to see you?

## 2023-07-03 ENCOUNTER — Ambulatory Visit: Payer: Managed Care, Other (non HMO) | Admitting: Medical

## 2023-07-03 VITALS — BP 120/70 | HR 79 | Wt 218.0 lb

## 2023-07-03 DIAGNOSIS — M436 Torticollis: Secondary | ICD-10-CM

## 2023-07-03 DIAGNOSIS — M542 Cervicalgia: Secondary | ICD-10-CM | POA: Diagnosis not present

## 2023-07-03 MED ORDER — CARISOPRODOL 250 MG PO TABS: 350.0000 mg | ORAL_TABLET | Freq: Four times a day (QID) | ORAL | 0 refills | Status: DC

## 2023-07-03 MED ORDER — CARISOPRODOL 250 MG PO TABS: 350.0000 mg | ORAL_TABLET | Freq: Every day | ORAL | 0 refills | Status: DC

## 2023-07-03 NOTE — Patient Instructions (Signed)
Please go to [facility] for your neck xray.   Their hours are 8am - 4:30 pm Monday - Friday.  Take your insurance card with you.  [facility] [phone]   [address] [place], [place] [address]

## 2023-07-03 NOTE — Progress Notes (Signed)
I have put in a urgent referral to PT

## 2023-07-03 NOTE — Progress Notes (Unsigned)
Subjective:  Lauren [name] is a 62 y.o. female who presents for Chief Complaint  Patient presents with   cramp in neck    Cramp in neck, started Sunday. Last week it was cramping in right side of neck.  Had cataract surgery and it didn't start until after it. Hurts to do anything like sneezing, rolling over     Here for neck pain, cramp.  Started on left side inJune 2024, subsided but never went away.  2nd time started on right side and progressed to left side.  This time it started Sunday worse.  Can barely move neck, can't roll over, hurts to sneeze.  Base of neck hurts through to side of face.   Pain down upper back/shoulder a little.  No arm pain.    No recent injury or trauma or fall.  No recent activity that she thinks aggravated her neck.  Has tried topical remedies over-the-counter, ice, heat.  Nothing seems to help.  No other aggravating or relieving factors.    No other c/o.  Past Medical History:  Diagnosis Date   Anemia    CKD stage 3b, GFR 30-44 ml/min (HCC)    Complication of anesthesia    Diabetes mellitus, type 2 (HCC)    Eczema    GERD (gastroesophageal reflux disease)    Glaucoma    Gout    Hyperlipidemia    Hypertension    Metabolic dysfunction-associated steatohepatitis (MASH)    PONV (postoperative nausea and vomiting)    Primary biliary cholangitis (HCC)    Sarcoidosis    Thyroid cancer (HCC)    Current Outpatient Medications on File Prior to Visit  Medication Sig Dispense Refill   allopurinol (ZYLOPRIM) 100 MG tablet Take 2 tablets (200 mg total) by mouth daily. 180 tablet 3   amLODipine-valsartan (EXFORGE) 5-160 MG tablet Take 1 tablet by mouth daily. 90 tablet 3   Cholecalciferol (VITAMIN D) 50 MCG (2000 UT) CAPS Take 1 capsule (2,000 Units total) by mouth daily. 90 capsule 3   Cobalamin Combinations (B-12) 1000-400 MCG SUBL Place 1 tablet under the tongue daily. 90 tablet 1   colchicine 0.6 MG tablet Take 1-2 tablets (0.6-1.2 mg total) by mouth daily as  needed (gout pain). Take 2 tablets at onset of gout pain.  Take an additional 1 tablet an hour later, if needed 90 tablet 0   FARXIGA 10 MG TABS tablet Take 10 mg by mouth daily.     Finerenone (KERENDIA) 10 MG TABS      Insulin Pen Needle (BD PEN NEEDLE NANO U/F) 32G X 4 MM MISC 1 each by Does not apply route at bedtime. 200 each 2   LUMIGAN 0.01 % SOLN Place 1 drop into both eyes at bedtime.     Multiple Vitamin (MULTIVITAMIN) capsule Take 1 capsule by mouth daily.     rosuvastatin (CRESTOR) 40 MG tablet Take 1 tablet (40 mg total) by mouth daily. 90 tablet 0   Semaglutide,0.25 or 0.5MG /DOS, 2 MG/3ML SOPN Inject 0.5 mg into the skin once a week. 9 mL 1   tiZANidine (ZANAFLEX) 4 MG tablet Take 1 tablet (4 mg total) by mouth at bedtime. 90 tablet 0   ursodiol (ACTIGALL) 300 MG capsule Take 600 mg by mouth 2 (two) times daily.     Continuous Blood Gluc Sensor (FREESTYLE LIBRE 2 SENSOR) MISC Use on back of arm 6 each 3   No current facility-administered medications on file prior to visit.     The following  portions of the patient's history were reviewed and updated as appropriate: allergies, current medications, past family history, past medical history, past social history, past surgical history and problem list.  ROS Otherwise as in subjective above  Objective: BP 120/70   Pulse 79   Wt 218 lb (98.9 kg)   BMI 40.52 kg/m   General appearance: alert, no distress, well developed, well nourished Neck with decreased range of motion in all planes except for flexion.  Tender along neck in general but particularly on the right lateral neck, no mass or lymphadenopathy or thyromegaly Back with mild supraspinatus and upper back tenderness otherwise nontender Arms nontender with normal range of motion No toxic appearing    Assessment: Encounter Diagnoses  Name Primary?   Neck pain Yes   Torticollis      Plan: Advise relative rest, heat, or combination of cold therapy and heat  therapy, stop tizanidine short-term and try Soma for the next day or 2 or 3.  Referral to physical therapy.  If any new symptoms call or recheck.  [name] was seen today for cramp in neck.  Diagnoses and all orders for this visit:  Neck pain -     DG Cervical Spine Complete; Future -     Ambulatory referral to Physical Therapy  Torticollis -     DG Cervical Spine Complete; Future -     Ambulatory referral to Physical Therapy  Other orders -     Discontinue: carisoprodol (SOMA) 250 MG tablet; Take 1.5 tablets (375 mg total) by mouth 4 (four) times daily. -     carisoprodol (SOMA) 250 MG tablet; Take 1.5 tablets (375 mg total) by mouth at bedtime.   Follow up: pending PT

## 2023-07-04 ENCOUNTER — Ambulatory Visit
Admission: RE | Admit: 2023-07-04 | Discharge: 2023-07-04 | Disposition: A | Discharge status: Home / Self Care | Payer: Managed Care, Other (non HMO) | Source: Ambulatory Visit | Attending: Medical | Admitting: Medical

## 2023-07-04 DIAGNOSIS — M542 Cervicalgia: Secondary | ICD-10-CM

## 2023-07-04 DIAGNOSIS — M436 Torticollis: Secondary | ICD-10-CM

## 2023-07-10 ENCOUNTER — Ambulatory Visit (INDEPENDENT_AMBULATORY_CARE_PROVIDER_SITE_OTHER): Payer: Managed Care, Other (non HMO) | Admitting: Orthopaedic Surgery

## 2023-07-10 ENCOUNTER — Encounter: Payer: Self-pay | Admitting: Orthopaedic Surgery

## 2023-07-10 DIAGNOSIS — M1712 Unilateral primary osteoarthritis, left knee: Secondary | ICD-10-CM

## 2023-07-10 DIAGNOSIS — Z96652 Presence of left artificial knee joint: Secondary | ICD-10-CM | POA: Diagnosis not present

## 2023-07-10 NOTE — Progress Notes (Signed)
X-ray shows some mild degenerative changes in the cervical spine.  Did your neck spasm and torticollis finally improve or not?  If not fully improved what percent improvement have you had?

## 2023-07-10 NOTE — Progress Notes (Signed)
Office Visit Note   Patient: Lauren [name]           Date of Birth: [birth year]           MRN: [id number] Visit Date: 07/10/2023              Requested by: Lauren Canavan, PA-C [address] [place],  [place] [address] PCP: Lauren Canavan, PA-C   Assessment & Plan: Visit Diagnoses:  1. Status post left partial knee replacement   2. Primary osteoarthritis of left knee     Plan: [name] is a 62 year old female who continues to experience chronic diffuse global pain of the left knee.  Prosthetic joint infection was ruled out back in [place].  Reports she never had a pain-free period of time postoperatively.  At this point she would like to move forward with conversion to a total knee replacement as I do feel that she is symptomatic from tricompartmental osteoarthritis.  Risk benefits prognosis reviewed with the patient.  She denies nickel allergy.  I would like her to achieve a goal weight of 210 pounds.  She will obtain operative records prior to scheduling surgery.  Follow-Up Instructions: No follow-ups on file.   Orders:  No orders of the defined types were placed in this encounter.  No orders of the defined types were placed in this encounter.     Procedures: No procedures performed   Clinical Data: No additional findings.   Subjective: Chief Complaint  Patient presents with   Left Knee - Follow-up    MRI review    HPI [name] returns today to discuss left knee MRI scan. Review of Systems  Constitutional: Negative.   HENT: Negative.    Eyes: Negative.   Respiratory: Negative.    Cardiovascular: Negative.   Endocrine: Negative.   Musculoskeletal: Negative.   Neurological: Negative.   Hematological: Negative.   Psychiatric/Behavioral: Negative.    All other systems reviewed and are negative.    Objective: Vital Signs: There were no vitals taken for this visit.  Physical Exam Vitals and nursing note reviewed.  Constitutional:      Appearance: She is  well-developed.  HENT:     Head: Normocephalic and atraumatic.  Pulmonary:     Effort: Pulmonary effort is normal.  Abdominal:     Palpations: Abdomen is soft.  Musculoskeletal:     Cervical back: Neck supple.  Skin:    General: Skin is warm.     Capillary Refill: Capillary refill takes less than 2 seconds.  Neurological:     Mental Status: She is alert and oriented to person, place, and time.  Psychiatric:        Behavior: Behavior normal.        Thought Content: Thought content normal.        Judgment: Judgment normal.     Ortho Exam Examination of left knee shows diffuse pain throughout the knee with subjective swelling.  2+ patellofemoral crepitus with range of motion.  Medial and lateral joint line tenderness.  Negative McMurray's. Specialty Comments:  No specialty comments available.  Imaging: No results found.   PMFS History: Patient Active Problem List   Diagnosis Date Noted   Type 2 diabetes mellitus with hyperglycemia, without long-term current use of insulin (HCC) 02/20/2023   Aortic atherosclerosis (HCC) 02/20/2023   Primary biliary cholangitis (HCC) 10/06/2022   Kidney stone 06/30/2022   Encounter for health maintenance examination in adult 06/30/2022   History of gastric bypass 06/30/2022   Anemia  06/30/2022   Altered mental status 06/30/2022   Nausea and vomiting 06/30/2022   Other constipation 02/14/2022   RUQ pain 02/14/2022   Hip fracture (HCC) 02/06/2022   Liver hematoma 02/05/2022   Elevated LFTs 01/12/2022   Allergic rhinitis due to pollen 09/12/2021   Pain in joints of both feet 09/12/2021   OSA (obstructive sleep apnea) 09/12/2021   Stage 3b chronic kidney disease (HCC) 07/18/2021   Elevated alkaline phosphatase level 07/18/2021   Elevated liver enzymes 07/18/2021   Chronic neck pain 07/13/2021   Chronic back pain 07/13/2021   Urge incontinence of urine 07/13/2021   Incontinence of feces 07/13/2021   Eczema 07/13/2021   Paresthesia of  arm 07/13/2021   H/O left knee surgery 07/13/2021   History of thyroid cancer 07/13/2021   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   Hyperlipidemia 07/13/2021   Essential hypertension, benign 07/13/2021   Elevated uric acid in blood 07/13/2021   Insomnia 07/13/2021   Elevated serum creatinine 07/13/2021   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   Past Medical History:  Diagnosis Date   Anemia    CKD stage 3b, GFR 30-44 ml/min (HCC)    Complication of anesthesia    Diabetes mellitus, type 2 (HCC)    Eczema    GERD (gastroesophageal reflux disease)    Glaucoma    Gout    Hyperlipidemia    Hypertension    Metabolic dysfunction-associated steatohepatitis (MASH)    PONV (postoperative nausea and vomiting)    Primary biliary cholangitis (HCC)    Sarcoidosis    Thyroid cancer (HCC)     Family History  Problem Relation Age of Onset   Breast cancer Mother    Breast cancer Maternal Grandmother    Diabetes Mellitus II Paternal Grandfather    Throat cancer Paternal Grandfather    Stomach cancer Neg Hx    Esophageal cancer Neg Hx    Pancreatic cancer Neg Hx    Colon cancer Neg Hx     Past Surgical History:  Procedure Laterality Date   COLONOSCOPY     GASTRIC BYPASS     KNEE SURGERY Left 2017   LIVER BIOPSY  01/2022   Social History   Occupational History   Not on file  Tobacco Use   Smoking status: Former    Current packs/day: 0.00    Average packs/day: 1.3 packs/day for 22.0 years (27.5 ttl pk-yrs)    Types: E-cigarettes, Cigarettes    Start date: 36    Quit date: 2002    Years since quitting: 22.5   Smokeless tobacco: Never   Tobacco comments:    Stopped smoking 26yrs ago  Vaping Use   Vaping status: Never Used  Substance and Sexual Activity   Alcohol use: Yes    Comment: Occ.   Drug use: Not Currently    Comment: gummies   Sexual activity: Yes

## 2023-07-12 ENCOUNTER — Ambulatory Visit: Payer: Managed Care, Other (non HMO) | Admitting: Medical

## 2023-07-12 ENCOUNTER — Encounter: Payer: Self-pay | Admitting: Medical

## 2023-07-12 VITALS — BP 120/80 | HR 78 | Ht 63.0 in | Wt 217.0 lb

## 2023-07-12 DIAGNOSIS — I10 Essential (primary) hypertension: Secondary | ICD-10-CM | POA: Diagnosis not present

## 2023-07-12 DIAGNOSIS — K743 Primary biliary cirrhosis: Secondary | ICD-10-CM

## 2023-07-12 DIAGNOSIS — I700 Atherosclerosis of aorta: Secondary | ICD-10-CM

## 2023-07-12 DIAGNOSIS — E785 Hyperlipidemia, unspecified: Secondary | ICD-10-CM

## 2023-07-12 DIAGNOSIS — G4733 Obstructive sleep apnea (adult) (pediatric): Secondary | ICD-10-CM

## 2023-07-12 DIAGNOSIS — E890 Postprocedural hypothyroidism: Secondary | ICD-10-CM

## 2023-07-12 DIAGNOSIS — Z85850 Personal history of malignant neoplasm of thyroid: Secondary | ICD-10-CM

## 2023-07-12 DIAGNOSIS — Z111 Encounter for screening for respiratory tuberculosis: Secondary | ICD-10-CM

## 2023-07-12 DIAGNOSIS — Z0000 Encounter for general adult medical examination without abnormal findings: Secondary | ICD-10-CM | POA: Diagnosis not present

## 2023-07-12 DIAGNOSIS — N1832 Chronic kidney disease, stage 3b: Secondary | ICD-10-CM

## 2023-07-12 DIAGNOSIS — Z9884 Bariatric surgery status: Secondary | ICD-10-CM | POA: Diagnosis not present

## 2023-07-12 DIAGNOSIS — M1A9XX0 Chronic gout, unspecified, without tophus (tophi): Secondary | ICD-10-CM

## 2023-07-12 DIAGNOSIS — N3941 Urge incontinence: Secondary | ICD-10-CM

## 2023-07-12 DIAGNOSIS — E790 Hyperuricemia without signs of inflammatory arthritis and tophaceous disease: Secondary | ICD-10-CM

## 2023-07-12 DIAGNOSIS — Z139 Encounter for screening, unspecified: Secondary | ICD-10-CM

## 2023-07-12 DIAGNOSIS — J301 Allergic rhinitis due to pollen: Secondary | ICD-10-CM

## 2023-07-12 DIAGNOSIS — E1165 Type 2 diabetes mellitus with hyperglycemia: Secondary | ICD-10-CM

## 2023-07-12 LAB — CBC WITH DIFFERENTIAL/PLATELET
Basophils Absolute: 0 10*3/uL (ref 0.0–0.2)
Basos: 1 %
EOS (ABSOLUTE): 0.4 10*3/uL (ref 0.0–0.4)
Eos: 5 %
Hematocrit: 38.1 % (ref 34.0–46.6)
Hemoglobin: 12.4 g/dL (ref 11.1–15.9)
Immature Grans (Abs): 0 10*3/uL (ref 0.0–0.1)
Immature Granulocytes: 0 %
Lymphocytes Absolute: 2.6 10*3/uL (ref 0.7–3.1)
Lymphs: 38 %
MCH: 27.8 pg (ref 26.6–33.0)
MCHC: 32.5 g/dL (ref 31.5–35.7)
MCV: 85 fL (ref 79–97)
Monocytes Absolute: 0.4 10*3/uL (ref 0.1–0.9)
Monocytes: 6 %
Neutrophils Absolute: 3.4 10*3/uL (ref 1.4–7.0)
Neutrophils: 50 %
Platelets: 364 10*3/uL (ref 150–450)
RBC: 4.46 x10E6/uL (ref 3.77–5.28)
RDW: 14.8 % (ref 11.7–15.4)
WBC: 6.9 10*3/uL (ref 3.4–10.8)

## 2023-07-12 LAB — RENAL FUNCTION PANEL

## 2023-07-12 LAB — TSH

## 2023-07-12 LAB — MEASLES/MUMPS/RUBELLA IMMUNITY

## 2023-07-12 LAB — VITAMIN D 25 HYDROXY (VIT D DEFICIENCY, FRACTURES)

## 2023-07-12 LAB — URIC ACID

## 2023-07-12 LAB — QUANTIFERON-TB GOLD PLUS

## 2023-07-12 MED ORDER — ALLOPURINOL 100 MG PO TABS: 200.0000 mg | ORAL_TABLET | Freq: Every day | ORAL | 3 refills | Status: AC

## 2023-07-12 MED ORDER — COLCHICINE 0.6 MG PO TABS
0.6000 mg | ORAL_TABLET | Freq: Every day | ORAL | 0 refills | Status: DC | PRN
Start: 2023-07-12 — End: 2023-07-12

## 2023-07-12 MED ORDER — ROSUVASTATIN CALCIUM 40 MG PO TABS: 40.0000 mg | ORAL_TABLET | Freq: Every day | ORAL | 3 refills | Status: AC

## 2023-07-12 MED ORDER — FREESTYLE LIBRE 2 SENSOR MISC: 3 refills | Status: DC

## 2023-07-12 MED ORDER — B-12 1000-400 MCG SL SUBL: 1.0000 | SUBLINGUAL_TABLET | Freq: Every day | SUBLINGUAL | 3 refills | Status: DC

## 2023-07-12 MED ORDER — SEMAGLUTIDE (1 MG/DOSE) 4 MG/3ML ~~LOC~~ SOPN: 1.0000 mg | PEN_INJECTOR | SUBCUTANEOUS | 2 refills | Status: DC

## 2023-07-12 MED ORDER — AMLODIPINE BESYLATE-VALSARTAN 5-160 MG PO TABS: 1.0000 | ORAL_TABLET | Freq: Every day | ORAL | 3 refills | Status: DC

## 2023-07-12 MED ORDER — COLCHICINE 0.6 MG PO TABS
ORAL_TABLET | ORAL | 0 refills | Status: AC
Start: 2023-07-12 — End: ?

## 2023-07-12 NOTE — Progress Notes (Signed)
Subjective:   HPI  Lauren [name] is a 62 y.o. female who presents for Chief Complaint  Patient presents with   Annual Exam    Nonfasting Cpe, form to be filled out   Medical Team: Dr. Ileene Patrick and Doug Sou, PA, GI Annamarie Major, PA with liver clinic Dr. Coralyn Helling, pulmonology Dr. Huel Cote, orthopedics Dr. Silverio Lay, gynecology Sees dentist, [facility] Sees eye doctor, [facility]   Concerns: Here for well visit and needs forms completed for school.  Overall doing okay.  Sees kidney doctor today.  Has been seeing orthopedics and is going to have to have knee replacement surgery soon.  They want her to lose weight though.  Compliant with medications.  I just saw her recently for her neck spasm and torticollis.  She did her first physical therapy session.  Orthopedic get a recent hemoglobin A1c on her that was 6.6%.  She is nonfasting today.  She had a hash brown about a hour ago.  OSA - uses CPAP   No other complaint  Past Medical History:  Diagnosis Date   Anemia    CKD stage 3b, GFR 30-44 ml/min (HCC)    Complication of anesthesia    Diabetes mellitus, type 2 (HCC)    Eczema    GERD (gastroesophageal reflux disease)    Glaucoma    Gout    Hyperlipidemia    Hypertension    Metabolic dysfunction-associated steatohepatitis (MASH)    PONV (postoperative nausea and vomiting)    Primary biliary cholangitis (HCC)    Sarcoidosis    Thyroid cancer (HCC)     Family History  Problem Relation Age of Onset   Breast cancer Mother    Breast cancer Maternal Grandmother    Diabetes Mellitus II Paternal Grandfather    Throat cancer Paternal Grandfather    Stomach cancer Neg Hx    Esophageal cancer Neg Hx    Pancreatic cancer Neg Hx    Colon cancer Neg Hx      Current Outpatient Medications:    allopurinol (ZYLOPRIM) 100 MG tablet, Take 2 tablets (200 mg total) by mouth daily., Disp: 180 tablet, Rfl: 3   amLODipine-valsartan  (EXFORGE) 5-160 MG tablet, Take 1 tablet by mouth daily., Disp: 90 tablet, Rfl: 3   carisoprodol (SOMA) 250 MG tablet, Take 1.5 tablets (375 mg total) by mouth at bedtime., Disp: 12 tablet, Rfl: 0   Cholecalciferol (VITAMIN D) 50 MCG (2000 UT) CAPS, Take 1 capsule (2,000 Units total) by mouth daily., Disp: 90 capsule, Rfl: 3   Cobalamin Combinations (B-12) 1000-400 MCG SUBL, Place 1 tablet under the tongue daily., Disp: 90 tablet, Rfl: 1   colchicine 0.6 MG tablet, Take 1-2 tablets (0.6-1.2 mg total) by mouth daily as needed (gout pain). Take 2 tablets at onset of gout pain.  Take an additional 1 tablet an hour later, if needed, Disp: 90 tablet, Rfl: 0   Continuous Blood Gluc Sensor (FREESTYLE LIBRE 2 SENSOR) MISC, Use on back of arm, Disp: 6 each, Rfl: 3   FARXIGA 10 MG TABS tablet, Take 10 mg by mouth daily., Disp: , Rfl:    Finerenone (KERENDIA) 10 MG TABS, , Disp: , Rfl:    Insulin Pen Needle (BD PEN NEEDLE NANO U/F) 32G X 4 MM MISC, 1 each by Does not apply route at bedtime., Disp: 200 each, Rfl: 2   LUMIGAN 0.01 % SOLN, Place 1 drop into both eyes at bedtime., Disp: , Rfl:    Multiple  Vitamin (MULTIVITAMIN) capsule, Take 1 capsule by mouth daily., Disp: , Rfl:    rosuvastatin (CRESTOR) 40 MG tablet, Take 1 tablet (40 mg total) by mouth daily., Disp: 90 tablet, Rfl: 0   Semaglutide,0.25 or 0.5MG /DOS, 2 MG/3ML SOPN, Inject 0.5 mg into the skin once a week., Disp: 9 mL, Rfl: 1   tiZANidine (ZANAFLEX) 4 MG tablet, Take 1 tablet (4 mg total) by mouth at bedtime., Disp: 90 tablet, Rfl: 0   ursodiol (ACTIGALL) 300 MG capsule, Take 600 mg by mouth 2 (two) times daily., Disp: , Rfl:   Allergies  Allergen Reactions   Oxycodone Itching    Reviewed their medical, surgical, family, social, medication, and allergy history and updated chart as appropriate.   Review of Systems  Constitutional:  Negative for chills, fever, malaise/fatigue and weight loss.  HENT:  Negative for congestion, ear pain,  hearing loss, sore throat and tinnitus.   Eyes:  Negative for blurred vision, pain and redness.  Respiratory:  Negative for cough, hemoptysis and shortness of breath.   Cardiovascular:  Negative for chest pain, palpitations, orthopnea, claudication and leg swelling.  Gastrointestinal:  Negative for abdominal pain, blood in stool, constipation, diarrhea, nausea and vomiting.  Genitourinary:  Negative for dysuria, flank pain, frequency, hematuria and urgency.  Musculoskeletal:  Positive for myalgias and neck pain. Negative for falls and joint pain.  Skin:  Negative for itching and rash.  Neurological:  Negative for dizziness, tingling, speech change, weakness and headaches.  Endo/Heme/Allergies:  Negative for polydipsia. Does not bruise/bleed easily.  Psychiatric/Behavioral:  Negative for depression and memory loss. The patient is not nervous/anxious and does not have insomnia.         07/12/2023   11:29 AM 06/30/2022    1:49 PM 12/23/2021    2:20 PM 08/29/2021    3:49 PM 07/13/2021   11:34 AM  Depression screen PHQ 2/9  Decreased Interest 0 0 0 0 3  Down, Depressed, Hopeless 0 0 0 0 3  PHQ - 2 Score 0 0 0 0 6  Altered sleeping     3  Tired, decreased energy     0  Change in appetite     3  Feeling bad or failure about yourself      3  Trouble concentrating     0  Moving slowly or fidgety/restless     0  Suicidal thoughts     0  PHQ-9 Score     15  Difficult doing work/chores     Not difficult at all       Objective:  BP 120/80   Pulse 78   Ht 5\' 3"  (1.6 m)   Wt 217 lb (98.4 kg)   BMI 38.44 kg/m   Wt Readings from Last 3 Encounters:  07/12/23 217 lb (98.4 kg)  07/03/23 218 lb (98.9 kg)  04/24/23 212 lb 9.6 oz (96.4 kg)   BP Readings from Last 3 Encounters:  07/12/23 120/80  07/03/23 120/70  04/24/23 (!) 144/92    General appearance: alert, no distress, WD/WN, African American female Skin: darker coloration around neck c/w acanthosis nigricans, no other lesions HEENT:  normocephalic, conjunctiva/corneas normal, sclerae anicteric, PERRLA, EOMi, nares patent, no discharge or erythema, pharynx normal Oral cavity: MMM, tongue normal, teeth in good repair Neck: supple, no lymphadenopathy, no thyromegaly, no masses, normal ROM, no bruits Chest: non tender, normal shape and expansion Heart: RRR, normal S1, S2, no murmurs Lungs: CTA bilaterally, no wheezes, rhonchi, or rales Abdomen: +bs,  soft, non tender, non distended, no masses, no hepatomegaly, no splenomegaly, no bruits Back: non tender, normal ROM, no scoliosis Musculoskeletal: upper extremities non tender, no obvious deformity, normal ROM throughout, lower extremities non tender, no obvious deformity, normal ROM throughout Extremities: no edema, no cyanosis, no clubbing Pulses: 2+ symmetric, upper and 1+ bilat lower extremities, normal cap refill Neurological: alert, oriented x 3, CN2-12 intact, strength normal upper extremities and lower extremities, sensation normal throughout, DTRs 2+ throughout, no cerebellar signs, gait normal Psychiatric: normal affect, behavior normal, pleasant  Breast/gyn/rectal - deferred to gynecology   Diabetic Foot Exam - Simple   Simple Foot Form Diabetic Foot exam was performed with the following findings: Yes 07/12/2023 12:19 PM  Visual Inspection See comments: Yes Sensation Testing Intact to touch and monofilament testing bilaterally: Yes Pulse Check Posterior Tibialis and Dorsalis pulse intact bilaterally: Yes Comments Flat feet , no lesions       Assessment and Plan :   Encounter Diagnoses  Name Primary?   Encounter for health maintenance examination in adult Yes   Hyperlipidemia, unspecified hyperlipidemia type    History of thyroid cancer    History of gastric bypass    Essential hypertension, benign    Type 2 diabetes mellitus with hyperglycemia, without long-term current use of insulin (HCC)    Urge incontinence of urine    Stage 3b chronic kidney  disease (HCC)    Primary biliary cholangitis (HCC)    Postoperative hypothyroidism    OSA (obstructive sleep apnea)    Elevated uric acid in blood    Chronic gout without tophus, unspecified cause, unspecified site    Aortic atherosclerosis (HCC)    Allergic rhinitis due to pollen, unspecified seasonality    Screening-pulmonary TB    Screening for condition     This visit was a preventative care visit, also known as wellness visit or routine physical.   Topics typically include healthy lifestyle, diet, exercise, preventative care, vaccinations, sick and well care, proper use of emergency dept and after hours care, as well as other concerns.     Recommendations: Continue to return yearly for your annual wellness and preventative care visits.  This gives [place] a chance to discuss healthy lifestyle, exercise, vaccinations, review your chart record, and perform screenings where appropriate.  I recommend you see your eye doctor yearly for routine vision care.  I recommend you see your dentist yearly for routine dental care including hygiene visits twice yearly.  Follow up with gynecology yearly    Vaccination recommendations were reviewed Immunization History  Administered Date(s) Administered   COVID-19, mRNA, vaccine(Comirnaty)12 years and older 09/02/2022   Hepb-cpg 01/27/2022, 03/03/2022   Influenza Split 08/17/2021   Influenza, High Dose Seasonal PF 09/02/2022   Influenza-Unspecified 08/17/2021   PFIZER Comirnaty(Gray Top)Covid-19 Tri-Sucrose Vaccine 03/19/2020, 04/09/2020, 12/08/2020   PNEUMOCOCCAL CONJUGATE-20 04/25/2023   Pfizer Covid-19 Vaccine Bivalent Booster 82yrs & up 08/17/2021, 01/12/2022   Pneumococcal Conjugate-13 12/11/2018   Pneumococcal-Unspecified 04/23/2020   Rsv, Bivalent, Protein Subunit Rsvpref,pf Verdis Frederickson) 09/02/2022   Tdap 10/03/2021   Unspecified SARS-COV-2 Vaccination 08/17/2021   Zoster Recombinant(Shingrix) 05/24/2020, 07/19/2020   Get a flu shot  every fall   Screening for cancer: Colon cancer screening: Reviewed 3/23 colonoscopy report, due repeat in 5 years  Breast cancer screening: You should perform a self breast exam monthly.   We reviewed recommendations for regular mammograms and breast cancer screening.  Cervical cancer screening: We reviewed recommendations for pap smear screening.   Skin cancer screening: Check  your skin regularly for new changes, growing lesions, or other lesions of concern Come in for evaluation if you have skin lesions of concern.  Lung cancer screening: If you have a greater than 20 pack year history of tobacco use, then you may qualify for lung cancer screening with a chest CT scan.   Please call your insurance company to inquire about coverage for this test.  We currently don't have screenings for other cancers besides breast, cervical, colon, and lung cancers.  If you have a strong family history of cancer or have other cancer screening concerns, please let me know.    Bone health: Get at least 150 minutes of aerobic exercise weekly Get weight bearing exercise at least once weekly Bone density test:  A bone density test is an imaging test that uses a type of X-ray to measure the amount of calcium and other minerals in your bones. The test may be used to diagnose or screen you for a condition that causes weak or thin bones (osteoporosis), predict your risk for a broken bone (fracture), or determine how well your osteoporosis treatment is working. The bone density test is recommended for females 65 and older, or females or males <65 if certain risk factors such as thyroid disease, long term use of steroids such as for asthma or rheumatological issues, vitamin D deficiency, estrogen deficiency, family history of osteoporosis, self or family history of fragility fracture in first degree relative.  Reviewed normal bone density done  01/2022   Heart health: Get at least 150 minutes of aerobic  exercise weekly Limit alcohol It is important to maintain a healthy blood pressure and healthy cholesterol numbers  Heart disease screening: Screening for heart disease includes screening for blood pressure, fasting lipids, glucose/diabetes screening, BMI height to weight ratio, reviewed of smoking status, physical activity, and diet.    Goals include blood pressure 120/80 or less, maintaining a healthy lipid/cholesterol profile, preventing diabetes or keeping diabetes numbers under good control, not smoking or using tobacco products, exercising most days per week or at least 150 minutes per week of exercise, and eating healthy variety of fruits and vegetables, healthy oils, and avoiding unhealthy food choices like fried food, fast food, high sugar and high cholesterol foods.    Other tests may possibly include EKG test, CT coronary calcium score, echocardiogram, exercise treadmill stress test.   CT chest angio 01/2022 with no significant CAD   Medical care options: I recommend you continue to seek care here first for routine care.  We try really hard to have available appointments Monday through Friday daytime hours for sick visits, acute visits, and physicals.  Urgent care should be used for after hours and weekends for significant issues that cannot wait till the next day.  The emergency department should be used for significant potentially life-threatening emergencies.  The emergency department is expensive, can often have long wait times for less significant concerns, so try to utilize primary care, urgent care, or telemedicine when possible to avoid unnecessary trips to the emergency department.  Virtual visits and telemedicine have been introduced since the pandemic started in 2020, and can be convenient ways to receive medical care.  We offer virtual appointments as well to assist you in a variety of options to seek medical care.   Advanced Directives: I recommend you consider completing a  Health Care Power of Attorney and Living Will.   These documents respect your wishes and help alleviate burdens on your loved ones if you  were to become terminally ill or be in a position to need those documents enforced.    You can complete Advanced Directives yourself, have them notarized, then have copies made for our office, for you and for anybody you feel should have them in safe keeping.  Or, you can have an attorney prepare these documents.   If you haven't updated your Last Will and Testament in a while, it may be worthwhile having an attorney prepare these documents together and save on some costs.      Separate significant issues discussed: Diabetes-increase Ozempic to 1mg  weekly.   CKD 3B-I reviewed her nephrology notes from December 2023.  CKD related to hypertension, diabetes, obesity and history of kidney stones.  Also potential for sarcoidosis being a culprit.  At that time she had SGLT 2 added to help with proteinuria.  Kidney biopsy was recommended if proteinuria did not improve.  She had another evaluation prior including negative testing for HIV, hepatitis B, hepatitis C, SPEP, free light chains, ANA negative.  As of 11/2022 was on ARB and SGLT2. Chauncey Mann was added in recent months.  OSA-continue CPAP  Chronic gout, elevated uric acid-continue allopurinol, updated labs today.  Chronic liver disease-sees liver specialist, on Ursodiol  Hypertension-continue current medication   Kamaiyah was seen today for annual exam.  Diagnoses and all orders for this visit:  Encounter for health maintenance examination in adult -     Measles/Mumps/Rubella Immunity -     Renal Function Panel -     VITAMIN D 25 Hydroxy (Vit-D Deficiency, Fractures) -     Uric acid -     TSH -     CBC with Differential/Platelet  Hyperlipidemia, unspecified hyperlipidemia type  History of thyroid cancer  History of gastric bypass  Essential hypertension, benign  Type 2 diabetes mellitus with  hyperglycemia, without long-term current use of insulin (HCC)  Urge incontinence of urine  Stage 3b chronic kidney disease (HCC) -     Renal Function Panel  Primary biliary cholangitis (HCC)  Postoperative hypothyroidism  OSA (obstructive sleep apnea)  Elevated uric acid in blood  Chronic gout without tophus, unspecified cause, unspecified site -     Uric acid  Aortic atherosclerosis (HCC)  Allergic rhinitis due to pollen, unspecified seasonality  Screening-pulmonary TB -     QuantiFERON-TB Gold Plus  Screening for condition -     Measles/Mumps/Rubella Immunity    Follow-up pending labs, yearly for physical

## 2023-07-12 NOTE — Addendum Note (Signed)
Addended by: Jac Canavan on: 07/12/2023 03:50 PM   Modules accepted: Orders

## 2023-07-13 NOTE — Progress Notes (Signed)
Results sent through MyChart

## 2023-07-13 NOTE — Progress Notes (Signed)
Please send a copy of my note and labs to [facility]  Results sent through My Chart

## 2023-07-17 ENCOUNTER — Telehealth: Payer: Self-pay | Admitting: Orthopaedic Surgery

## 2023-07-17 NOTE — Telephone Encounter (Signed)
I'm pretty sure she wanted to move forward with conversion to a total knee.  If she agrees then I can fill out surgery sheet for debbie.

## 2023-07-17 NOTE — Telephone Encounter (Signed)
Received call from patient. Wants to know the next step in her care. The Op note from previous surgery has been received, and she understood that was needed prior to scheduling surgery. Please call patient at [phone]  A hard copy of the Op note was delivered to Dr. Roda Shutters on 07/12/23 and is scanned in media tab.

## 2023-07-18 NOTE — Telephone Encounter (Signed)
Yes, patient wants to move forward with total knee arthroplasty. Please fill out sheet for Lauren [name].

## 2023-07-22 NOTE — Progress Notes (Signed)
Complete her form.   QuantiFERON tuberculosis test negative.  You are immune to mumps measles and rubella.

## 2023-08-06 ENCOUNTER — Other Ambulatory Visit (INDEPENDENT_AMBULATORY_CARE_PROVIDER_SITE_OTHER): Payer: Managed Care, Other (non HMO)

## 2023-08-06 DIAGNOSIS — Z23 Encounter for immunization: Secondary | ICD-10-CM

## 2023-08-10 ENCOUNTER — Other Ambulatory Visit: Payer: Self-pay | Admitting: Medical

## 2023-08-27 ENCOUNTER — Encounter: Payer: Managed Care, Other (non HMO) | Admitting: Medical

## 2023-08-29 ENCOUNTER — Encounter (HOSPITAL_BASED_OUTPATIENT_CLINIC_OR_DEPARTMENT_OTHER): Payer: Self-pay | Admitting: Pulmonary Disease

## 2023-08-29 ENCOUNTER — Ambulatory Visit (HOSPITAL_BASED_OUTPATIENT_CLINIC_OR_DEPARTMENT_OTHER): Payer: Managed Care, Other (non HMO) | Admitting: Pulmonary Disease

## 2023-08-29 VITALS — BP 120/80 | HR 78 | Ht 63.0 in | Wt 211.0 lb

## 2023-08-29 DIAGNOSIS — G4733 Obstructive sleep apnea (adult) (pediatric): Secondary | ICD-10-CM | POA: Diagnosis not present

## 2023-08-29 DIAGNOSIS — Z862 Personal history of diseases of the blood and blood-forming organs and certain disorders involving the immune mechanism: Secondary | ICD-10-CM

## 2023-08-29 NOTE — Patient Instructions (Signed)
Call if you need help getting your medical records sent to [facility]  Follow up with [facility] as needed

## 2023-08-29 NOTE — Progress Notes (Signed)
[facility]  Chief Complaint  Patient presents with   Sleep Apnea   Sarcoidosis    Past Surgical History:  She  has a past surgical history that includes Gastric bypass; Knee surgery (Left, 2017); Colonoscopy; and Liver biopsy (01/2022).  Past Medical History:  Anemia, Thyroid cancer, HLD, HTN, Gout, DM type 2, Reflux, Sarcoidosis, Eczema, Glaucoma, CKD 3b, Primary biliary cholangitis, Fatty liver  Constitutional:  BP 120/80   Pulse 78   Ht 5\' 3"  (1.6 m)   Wt 211 lb (95.7 kg)   SpO2 100%   BMI 37.38 kg/m   Brief Summary:  Lauren [name] is a 62 y.o. female with obstructive sleep apnea.      Subjective:   She is doing well with CPAP.  Has a nasal cushion mask.  Has mild mouth dryness.  She is sleeping better since she put her TV on a timer.    Not having cough, wheeze, or sputum.  Chest xray from 04/24/23 was normal.  She plans to move to [place] in 2025 to be closer to her daughter.  Physical Exam:   Appearance - well kempt   ENMT - no sinus tenderness, no oral exudate, no LAN, Mallampati 3 airway, no stridor  Respiratory - equal breath sounds bilaterally, no wheezing or rales  CV - s1s2 regular rate and rhythm, no murmurs  Ext - no clubbing, no edema  Skin - no rashes  Psych - normal mood and affect    Sleep Tests:  PSG 11/26/08 >> AHI 19.4, SpO2 low 80% CPAP 07/28/23 to 08/26/23 >> used on 24 of 30 nights with average 5 hrs 52 min.  Average AHI 0.2 with CPAP 10 cm H2O  Social History:  She  reports that she quit smoking about 22 years ago. Her smoking use included e-cigarettes and cigarettes. She started smoking about 44 years ago. She has a 27.5 pack-year smoking history. She has never used smokeless tobacco. She reports current alcohol use. She reports that she does not currently use drugs.  Family History:  Her family history includes Breast cancer in her maternal grandmother and mother; Diabetes Mellitus II in her  paternal grandfather; Throat cancer in her paternal grandfather.     Assessment/Plan:   Obstructive sleep apnea. - she is compliant with CPAP and reports benefit from therapy - she uses [employer] for her DME - current CPAP ordered May 2024 - continue CPAP 10 cm H2O  History of sarcoidosis. - this was from the 1990's - she does not have significant pulmonary symptoms  - recent chest xray was normal - monitor clinically - advised her to follow up with her hepatologist and nephrologist if there is clinical concern from them for extrapulmonary sarcoidosis  Primary biliary cholangitis. - followed by [facility]  Time Spent Involved in Patient Care on Day of Examination:  26 minutes  Follow up:   Patient Instructions  Call if you need help getting your medical records sent to [facility]  Follow up with [facility] as needed  Medication List:   Allergies as of 08/29/2023       Reactions   Oxycodone Itching        Medication List        Accurate as of August 29, 2023 11:37 AM. If you have any questions, ask your nurse or doctor.          allopurinol 100 MG tablet Commonly known as: ZYLOPRIM Take 2 tablets (  200 mg total) by mouth daily.   amLODipine-valsartan 5-160 MG tablet Commonly known as: EXFORGE Take 1 tablet by mouth daily.   B-12 1000-400 MCG Subl Place 1 tablet under the tongue daily.   carisoprodol 250 MG tablet Commonly known as: Soma Take 1.5 tablets (375 mg total) by mouth at bedtime.   colchicine 0.6 MG tablet 1 tablet BID prn for gout flare   Farxiga 10 MG Tabs tablet Generic drug: dapagliflozin propanediol Take 10 mg by mouth daily.   FreeStyle Libre 2 Sensor Misc Use on back of arm   Kerendia 10 MG Tabs Generic drug: Finerenone   Lumigan 0.01 % Soln Generic drug: bimatoprost Place 1 drop into both eyes at bedtime.   multivitamin capsule Take 1 capsule by mouth daily.   rosuvastatin 40 MG tablet Commonly  known as: Crestor Take 1 tablet (40 mg total) by mouth daily.   Semaglutide (1 MG/DOSE) 4 MG/3ML Sopn Inject 1 mg as directed once a week.   ursodiol 300 MG capsule Commonly known as: ACTIGALL Take 600 mg by mouth 2 (two) times daily.   Vitamin D3 50 MCG (2000 UT) capsule TAKE 1 CAPSULE DAILY        Signature:  Coralyn Helling, MD [facility] Pulmonary/Critical Care Pager - [phone] 08/29/2023, 11:37 AM

## 2023-10-04 NOTE — Progress Notes (Signed)
Surgical Instructions   Your procedure is scheduled on Monday October 15, 2023. Report to [facility] Main Entrance "A" at 10:30 A.M., then check in with the Admitting office. Any questions or running late day of surgery: call [phone]  Questions prior to your surgery date: call [phone], Monday-Friday, 8am-4pm. If you experience any cold or flu symptoms such as cough, fever, chills, shortness of breath, etc. between now and your scheduled surgery, please notify us at the above number.     Remember:  Do not eat after midnight the night before your surgery  You may drink clear liquids until 10:00 the morning of your surgery.   Clear liquids allowed are: Water, Non-Citrus Juices (without pulp), Carbonated Beverages, Clear Tea, Black Coffee Only (NO MILK, CREAM OR POWDERED CREAMER of any kind), and Gatorade.  Patient Instructions  The night before surgery:  No food after midnight. ONLY clear liquids after midnight  The day of surgery (if you have diabetes): Drink ONE (1) 12 oz G2 given to you in your pre admission testing appointment by 10:00 the morning of surgery. Drink in one sitting. Do not sip.  This drink was given to you during your hospital  pre-op appointment visit.  Nothing else to drink after completing the  12 oz bottle of G2.         If you have questions, please contact your surgeon's office.   Take these medicines the morning of surgery with A SIP OF WATER  allopurinol (ZYLOPRIM)  Finerenone (KERENDIA)  rosuvastatin (CRESTOR)  ursodiol (ACTIGALL)   May take these medicines IF NEEDED: colchicine   One week prior to surgery, STOP taking any Aspirin (unless otherwise instructed by your surgeon) Aleve, Naproxen, Ibuprofen, Motrin, Advil, Goody's, BC's, all herbal medications, fish oil, and non-prescription vitamins.    WHAT DO I DO ABOUT MY DIABETES MEDICATION?   Do not take oral diabetes medicines (pills) the morning of surgery.        DO NOT TAKE  YOUR  FARXIGA 72 HOURS PRIOR TO SURGERY, WITH THE LAST DOSE BEING 10/11/2023.         DO NOT TAKE YOUR Semaglutide 7 DAYS PRIOR TO SURGERY, WITH THE LAST DOSE BEING NO LATER THAN 10/07/2023.     The day of surgery, do not take other diabetes injectables, including Byetta (exenatide), Bydureon (exenatide ER), Victoza (liraglutide), or Trulicity (dulaglutide).  If your CBG is greater than 220 mg/dL, you may take  of your sliding scale (correction) dose of insulin.   HOW TO MANAGE YOUR DIABETES BEFORE AND AFTER SURGERY  Why is it important to control my blood sugar before and after surgery? Improving blood sugar levels before and after surgery helps healing and can limit problems. A way of improving blood sugar control is eating a healthy diet by:  Eating less sugar and carbohydrates  Increasing activity/exercise  Talking with your doctor about reaching your blood sugar goals High blood sugars (greater than 180 mg/dL) can raise your risk of infections and slow your recovery, so you will need to focus on controlling your diabetes during the weeks before surgery. Make sure that the doctor who takes care of your diabetes knows about your planned surgery including the date and location.  How do I manage my blood sugar before surgery? Check your blood sugar at least 4 times a day, starting 2 days before surgery, to make sure that the level is not too high or low.  Check your blood sugar the morning of your surgery  when you wake up and every 2 hours until you get to the Short Stay unit.  If your blood sugar is less than 70 mg/dL, you will need to treat for low blood sugar: Do not take insulin. Treat a low blood sugar (less than 70 mg/dL) with  cup of clear juice (cranberry or apple), 4 glucose tablets, OR glucose gel. Recheck blood sugar in 15 minutes after treatment (to make sure it is greater than 70 mg/dL). If your blood sugar is not greater than 70 mg/dL on recheck, call [phone] for  further instructions. Report your blood sugar to the short stay nurse when you get to Short Stay.  If you are admitted to the hospital after surgery: Your blood sugar will be checked by the staff and you will probably be given insulin after surgery (instead of oral diabetes medicines) to make sure you have good blood sugar levels. The goal for blood sugar control after surgery is 80-180 mg/dL.                      Do NOT Smoke (Tobacco/Vaping) for 24 hours prior to your procedure.  If you use a CPAP at night, you may bring your mask/headgear for your overnight stay.   You will be asked to remove any contacts, glasses, piercing's, hearing aid's, dentures/partials prior to surgery. Please bring cases for these items if needed.    Patients discharged the day of surgery will not be allowed to drive home, and someone needs to stay with them for 24 hours.  SURGICAL WAITING ROOM VISITATION Patients may have no more than 2 support people in the waiting area - these visitors may rotate.   Pre-op nurse will coordinate an appropriate time for 1 ADULT support person, who may not rotate, to accompany patient in pre-op.  Children under the age of 81 must have an adult with them who is not the patient and must remain in the main waiting area with an adult.  If the patient needs to stay at the hospital during part of their recovery, the visitor guidelines for inpatient rooms apply.  Please refer to the [facility] website for the visitor guidelines for any additional information.   If you received a COVID test during your pre-op visit  it is requested that you wear a mask when out in public, stay away from anyone that may not be feeling well and notify your surgeon if you develop symptoms. If you have been in contact with anyone that has tested positive in the last 10 days please notify you surgeon.      Pre-operative 5 CHG Bathing Instructions   You can play a key role in reducing the risk of  infection after surgery. Your skin needs to be as free of germs as possible. You can reduce the number of germs on your skin by washing with CHG (chlorhexidine gluconate) soap before surgery. CHG is an antiseptic soap that kills germs and continues to kill germs even after washing.   DO NOT use if you have an allergy to chlorhexidine/CHG or antibacterial soaps. If your skin becomes reddened or irritated, stop using the CHG and notify one of our RNs at [phone].   Please shower with the CHG soap starting 4 days before surgery using the following schedule:     Please keep in mind the following:  DO NOT shave, including legs and underarms, starting the day of your first shower.   You may shave your face  at any point before/day of surgery.  Place clean sheets on your bed the day you start using CHG soap. Use a clean washcloth (not used since being washed) for each shower. DO NOT sleep with pets once you start using the CHG.   CHG Shower Instructions:  Wash your face and private area with normal soap. If you choose to wash your hair, wash first with your normal shampoo.  After you use shampoo/soap, rinse your hair and body thoroughly to remove shampoo/soap residue.  Turn the water OFF and apply about 3 tablespoons (45 ml) of CHG soap to a CLEAN washcloth.  Apply CHG soap ONLY FROM YOUR NECK DOWN TO YOUR TOES (washing for 3-5 minutes)  DO NOT use CHG soap on face, private areas, open wounds, or sores.  Pay special attention to the area where your surgery is being performed.  If you are having back surgery, having someone wash your back for you may be helpful. Wait 2 minutes after CHG soap is applied, then you may rinse off the CHG soap.  Pat dry with a clean towel  Put on clean clothes/pajamas   If you choose to wear lotion, please use ONLY the CHG-compatible lotions on the back of this paper.   Additional instructions for the day of surgery: DO NOT APPLY any lotions, deodorants or  perfumes.   Do not bring valuables to the hospital. [facility] is not responsible for any belongings/valuables. Do not wear nail polish, gel polish, artificial nails, or any other type of covering on natural nails (fingers and toes) Do not wear jewelry or makeup Put on clean/comfortable clothes.  Please brush your teeth.  Ask your nurse before applying any prescription medications to the skin.     CHG Compatible Lotions   Aveeno Moisturizing lotion  Cetaphil Moisturizing Cream  Cetaphil Moisturizing Lotion  Clairol Herbal Essence Moisturizing Lotion, Dry Skin  Clairol Herbal Essence Moisturizing Lotion, Extra Dry Skin  Clairol Herbal Essence Moisturizing Lotion, Normal Skin  Curel Age Defying Therapeutic Moisturizing Lotion with Alpha Hydroxy  Curel Extreme Care Body Lotion  Curel Soothing Hands Moisturizing Hand Lotion  Curel Therapeutic Moisturizing Cream, Fragrance-Free  Curel Therapeutic Moisturizing Lotion, Fragrance-Free  Curel Therapeutic Moisturizing Lotion, Original Formula  Eucerin Daily Replenishing Lotion  Eucerin Dry Skin Therapy Plus Alpha Hydroxy Crme  Eucerin Dry Skin Therapy Plus Alpha Hydroxy Lotion  Eucerin Original Crme  Eucerin Original Lotion  Eucerin Plus Crme Eucerin Plus Lotion  Eucerin TriLipid Replenishing Lotion  Keri Anti-Bacterial Hand Lotion  Keri Deep Conditioning Original Lotion Dry Skin Formula Softly Scented  Keri Deep Conditioning Original Lotion, Fragrance Free Sensitive Skin Formula  Keri Lotion Fast Absorbing Fragrance Free Sensitive Skin Formula  Keri Lotion Fast Absorbing Softly Scented Dry Skin Formula  Keri Original Lotion  Keri Skin Renewal Lotion Keri Silky Smooth Lotion  Keri Silky Smooth Sensitive Skin Lotion  Nivea Body Creamy Conditioning Oil  Nivea Body Extra Enriched Lotion  Nivea Body Original Lotion  Nivea Body Sheer Moisturizing Lotion Nivea Crme  Nivea Skin Firming Lotion  NutraDerm 30 Skin Lotion  NutraDerm  Skin Lotion  NutraDerm Therapeutic Skin Cream  NutraDerm Therapeutic Skin Lotion  ProShield Protective Hand Cream  Provon moisturizing lotion  Please read over the following fact sheets that you were given.

## 2023-10-05 ENCOUNTER — Encounter (HOSPITAL_COMMUNITY)
Admission: RE | Admit: 2023-10-05 | Discharge: 2023-10-05 | Disposition: A | Discharge status: Home / Self Care | Payer: Managed Care, Other (non HMO) | Source: Ambulatory Visit | Attending: Orthopaedic Surgery | Admitting: Orthopaedic Surgery

## 2023-10-05 ENCOUNTER — Encounter (HOSPITAL_COMMUNITY): Payer: Self-pay

## 2023-10-05 ENCOUNTER — Other Ambulatory Visit: Payer: Self-pay

## 2023-10-05 VITALS — BP 136/85 | HR 76 | Temp 98.2°F | Resp 18 | Ht 62.0 in | Wt 209.9 lb

## 2023-10-05 DIAGNOSIS — Z01818 Encounter for other preprocedural examination: Secondary | ICD-10-CM | POA: Insufficient documentation

## 2023-10-05 DIAGNOSIS — E1165 Type 2 diabetes mellitus with hyperglycemia: Secondary | ICD-10-CM | POA: Diagnosis not present

## 2023-10-05 DIAGNOSIS — M1712 Unilateral primary osteoarthritis, left knee: Secondary | ICD-10-CM | POA: Insufficient documentation

## 2023-10-05 HISTORY — DX: Personal history of urinary calculi: Z87.442

## 2023-10-05 HISTORY — DX: Sleep apnea, unspecified: G47.30

## 2023-10-05 LAB — CBC
HCT: 37 % (ref 36.0–46.0)
Hemoglobin: 11.6 g/dL — ABNORMAL LOW (ref 12.0–15.0)
MCH: 28 pg (ref 26.0–34.0)
MCHC: 31.4 g/dL (ref 30.0–36.0)
MCV: 89.4 fL (ref 80.0–100.0)
Platelets: 311 10*3/uL (ref 150–400)
RBC: 4.14 MIL/uL (ref 3.87–5.11)
RDW: 15.3 % (ref 11.5–15.5)
WBC: 7.6 10*3/uL (ref 4.0–10.5)
nRBC: 0 % (ref 0.0–0.2)

## 2023-10-05 LAB — GLUCOSE, CAPILLARY: Glucose-Capillary: 92 mg/dL (ref 70–99)

## 2023-10-05 LAB — HEMOGLOBIN A1C
Hgb A1c MFr Bld: 6 % — ABNORMAL HIGH (ref 4.8–5.6)
Mean Plasma Glucose: 125.5 mg/dL

## 2023-10-05 LAB — BASIC METABOLIC PANEL
Anion gap: 10 (ref 5–15)
BUN: 14 mg/dL (ref 8–23)
CO2: 26 mmol/L (ref 22–32)
Calcium: 10.1 mg/dL (ref 8.9–10.3)
Chloride: 106 mmol/L (ref 98–111)
Creatinine, Ser: 1.82 mg/dL — ABNORMAL HIGH (ref 0.44–1.00)
GFR, Estimated: 31 mL/min — ABNORMAL LOW (ref >60)
Glucose, Bld: 102 mg/dL — ABNORMAL HIGH (ref 70–99)
Potassium: 4 mmol/L (ref 3.5–5.1)
Sodium: 142 mmol/L (ref 135–145)

## 2023-10-05 LAB — SURGICAL PCR SCREEN
MRSA, PCR: NEGATIVE
Staphylococcus aureus: NEGATIVE

## 2023-10-05 NOTE — Progress Notes (Signed)
PCP - david tysinger, pa-c Cardiologist - denies  PPM/ICD - denies  Chest x-ray - n/a EKG - 10/05/23 Stress Test - denies ECHO - denies Cardiac Cath - denies  Sleep Study - +OSA CPAP - wears nightly, 10psi  Fasting Blood Sugar - 90s Checks Blood Sugar - wears CGM  Last dose of GLP1 agonist-  ozempic GLP1 instructions: do not take after 10/272/24  ERAS Protcol -yes PRE-SURGERY Ensure or G2- g2 ordered and given  COVID TEST- not needed   Anesthesia review: no  Patient denies shortness of breath, fever, cough and chest pain at PAT appointment   All instructions explained to the patient, with a verbal understanding of the material. Patient agrees to go over the instructions while at home for a better understanding. Patient also instructed to self quarantine after being tested for COVID-19. The opportunity to ask questions was provided.

## 2023-10-08 ENCOUNTER — Other Ambulatory Visit: Payer: Self-pay | Admitting: Physician Assistant

## 2023-10-08 MED ORDER — ONDANSETRON HCL 4 MG PO TABS: 4.0000 mg | ORAL_TABLET | Freq: Three times a day (TID) | ORAL | 0 refills | Status: DC | PRN

## 2023-10-08 MED ORDER — METHOCARBAMOL 750 MG PO TABS: 750.0000 mg | ORAL_TABLET | Freq: Two times a day (BID) | ORAL | 2 refills | Status: DC | PRN

## 2023-10-08 MED ORDER — DOCUSATE SODIUM 100 MG PO CAPS: 100.0000 mg | ORAL_CAPSULE | Freq: Every day | ORAL | 2 refills | Status: DC | PRN

## 2023-10-08 MED ORDER — RIVAROXABAN 10 MG PO TABS: 10.0000 mg | ORAL_TABLET | Freq: Every day | ORAL | 0 refills | Status: DC

## 2023-10-08 MED ORDER — HYDROCODONE-ACETAMINOPHEN 7.5-325 MG PO TABS: 1.0000 | ORAL_TABLET | Freq: Four times a day (QID) | ORAL | 0 refills | Status: DC | PRN

## 2023-10-08 MED ORDER — DOXYCYCLINE HYCLATE 100 MG PO TABS: 100.0000 mg | ORAL_TABLET | Freq: Two times a day (BID) | ORAL | 0 refills | Status: DC

## 2023-10-12 ENCOUNTER — Other Ambulatory Visit: Payer: Self-pay | Admitting: Physician Assistant

## 2023-10-12 MED ORDER — TRANEXAMIC ACID 1000 MG/10ML IV SOLN
2000.0000 mg | INTRAVENOUS | Status: DC
  Filled 2023-10-12: qty 20

## 2023-10-12 MED ORDER — ONDANSETRON HCL 4 MG PO TABS: 4.0000 mg | ORAL_TABLET | Freq: Three times a day (TID) | ORAL | 0 refills | Status: DC | PRN

## 2023-10-12 MED ORDER — RIVAROXABAN 10 MG PO TABS: 10.0000 mg | ORAL_TABLET | Freq: Every day | ORAL | 0 refills | Status: DC

## 2023-10-12 MED ORDER — DOXYCYCLINE HYCLATE 100 MG PO CAPS: 100.0000 mg | ORAL_CAPSULE | Freq: Two times a day (BID) | ORAL | 0 refills | Status: DC

## 2023-10-12 MED ORDER — HYDROCODONE-ACETAMINOPHEN 7.5-325 MG PO TABS: 1.0000 | ORAL_TABLET | Freq: Four times a day (QID) | ORAL | 0 refills | Status: DC | PRN

## 2023-10-12 MED ORDER — METHOCARBAMOL 750 MG PO TABS: 750.0000 mg | ORAL_TABLET | Freq: Two times a day (BID) | ORAL | 2 refills | Status: DC | PRN

## 2023-10-12 MED ORDER — DOCUSATE SODIUM 100 MG PO CAPS: 100.0000 mg | ORAL_CAPSULE | Freq: Every day | ORAL | 2 refills | Status: DC | PRN

## 2023-10-15 ENCOUNTER — Encounter (HOSPITAL_COMMUNITY)
Admission: RE | Disposition: A | Discharge status: Home / Self Care | Payer: Self-pay | Source: Home / Self Care | Attending: Orthopaedic Surgery

## 2023-10-15 ENCOUNTER — Other Ambulatory Visit: Payer: Self-pay

## 2023-10-15 ENCOUNTER — Observation Stay (HOSPITAL_COMMUNITY)
Admission: RE | Admit: 2023-10-15 | Discharge: 2023-10-16 | Disposition: A | Discharge status: Home / Self Care | Payer: Managed Care, Other (non HMO) | Attending: Orthopaedic Surgery | Admitting: Orthopaedic Surgery

## 2023-10-15 ENCOUNTER — Other Ambulatory Visit: Payer: Self-pay | Admitting: Medical

## 2023-10-15 ENCOUNTER — Ambulatory Visit (HOSPITAL_BASED_OUTPATIENT_CLINIC_OR_DEPARTMENT_OTHER): Payer: Self-pay | Admitting: Anesthesiology

## 2023-10-15 ENCOUNTER — Encounter (HOSPITAL_COMMUNITY): Payer: Self-pay | Admitting: Orthopaedic Surgery

## 2023-10-15 ENCOUNTER — Ambulatory Visit (HOSPITAL_COMMUNITY): Payer: Managed Care, Other (non HMO) | Admitting: Anesthesiology

## 2023-10-15 ENCOUNTER — Observation Stay (HOSPITAL_COMMUNITY): Payer: Managed Care, Other (non HMO)

## 2023-10-15 DIAGNOSIS — T849XXA Unspecified complication of internal orthopedic prosthetic device, implant and graft, initial encounter: Secondary | ICD-10-CM | POA: Diagnosis not present

## 2023-10-15 DIAGNOSIS — E1165 Type 2 diabetes mellitus with hyperglycemia: Secondary | ICD-10-CM

## 2023-10-15 DIAGNOSIS — T84092A Other mechanical complication of internal right knee prosthesis, initial encounter: Secondary | ICD-10-CM | POA: Diagnosis not present

## 2023-10-15 DIAGNOSIS — Z79899 Other long term (current) drug therapy: Secondary | ICD-10-CM | POA: Diagnosis not present

## 2023-10-15 DIAGNOSIS — Z87891 Personal history of nicotine dependence: Secondary | ICD-10-CM | POA: Insufficient documentation

## 2023-10-15 DIAGNOSIS — Z85850 Personal history of malignant neoplasm of thyroid: Secondary | ICD-10-CM | POA: Diagnosis not present

## 2023-10-15 DIAGNOSIS — I129 Hypertensive chronic kidney disease with stage 1 through stage 4 chronic kidney disease, or unspecified chronic kidney disease: Secondary | ICD-10-CM | POA: Insufficient documentation

## 2023-10-15 DIAGNOSIS — E1122 Type 2 diabetes mellitus with diabetic chronic kidney disease: Secondary | ICD-10-CM | POA: Insufficient documentation

## 2023-10-15 DIAGNOSIS — Z7901 Long term (current) use of anticoagulants: Secondary | ICD-10-CM | POA: Insufficient documentation

## 2023-10-15 DIAGNOSIS — N1831 Chronic kidney disease, stage 3a: Secondary | ICD-10-CM | POA: Insufficient documentation

## 2023-10-15 DIAGNOSIS — Z96652 Presence of left artificial knee joint: Secondary | ICD-10-CM

## 2023-10-15 DIAGNOSIS — M1712 Unilateral primary osteoarthritis, left knee: Principal | ICD-10-CM | POA: Insufficient documentation

## 2023-10-15 HISTORY — PX: TOTAL KNEE REVISION: SHX996

## 2023-10-15 LAB — GLUCOSE, CAPILLARY
Glucose-Capillary: 89 mg/dL (ref 70–99)
Glucose-Capillary: 85 mg/dL (ref 70–99)
Glucose-Capillary: 81 mg/dL (ref 70–99)
Glucose-Capillary: 211 mg/dL — ABNORMAL HIGH (ref 70–99)
Glucose-Capillary: 153 mg/dL — ABNORMAL HIGH (ref 70–99)

## 2023-10-15 SURGERY — TOTAL KNEE REVISION
Anesthesia: Spinal | Site: Knee | Laterality: Left

## 2023-10-15 MED ORDER — BUPIVACAINE-MELOXICAM ER 400-12 MG/14ML IJ SOLN
INTRAMUSCULAR | Status: DC | PRN
  Administered 2023-10-15: 400 mg

## 2023-10-15 MED ORDER — LIDOCAINE 2% (20 MG/ML) 5 ML SYRINGE
INTRAMUSCULAR | Status: AC
  Filled 2023-10-15: qty 5

## 2023-10-15 MED ORDER — HYDROCODONE-ACETAMINOPHEN 7.5-325 MG PO TABS
1.0000 | ORAL_TABLET | ORAL | Status: DC | PRN
  Administered 2023-10-15 – 2023-10-16 (×3): 1 via ORAL
  Filled 2023-10-15 (×3): qty 1

## 2023-10-15 MED ORDER — HYDROCODONE-ACETAMINOPHEN 5-325 MG PO TABS: 1.0000 | ORAL_TABLET | ORAL | Status: DC | PRN

## 2023-10-15 MED ORDER — FENTANYL CITRATE (PF) 100 MCG/2ML IJ SOLN
INTRAMUSCULAR | Status: AC
  Filled 2023-10-15: qty 2

## 2023-10-15 MED ORDER — FENTANYL CITRATE (PF) 100 MCG/2ML IJ SOLN: 50.0000 ug | Freq: Once | INTRAMUSCULAR | Status: DC

## 2023-10-15 MED ORDER — ACETAMINOPHEN 500 MG PO TABS
500.0000 mg | ORAL_TABLET | Freq: Four times a day (QID) | ORAL | Status: AC
  Administered 2023-10-15 – 2023-10-16 (×4): 500 mg via ORAL
  Filled 2023-10-15 (×4): qty 1

## 2023-10-15 MED ORDER — ROPIVACAINE HCL 5 MG/ML IJ SOLN
INTRAMUSCULAR | Status: DC | PRN
  Administered 2023-10-15: 20 mL via PERINEURAL

## 2023-10-15 MED ORDER — INSULIN ASPART 100 UNIT/ML IJ SOLN: 0.0000 [IU] | Freq: Every day | INTRAMUSCULAR | Status: DC

## 2023-10-15 MED ORDER — MIDAZOLAM HCL 2 MG/2ML IJ SOLN
INTRAMUSCULAR | Status: AC
  Filled 2023-10-15: qty 2

## 2023-10-15 MED ORDER — ORAL CARE MOUTH RINSE: 15.0000 mL | Freq: Once | OROMUCOSAL | Status: AC

## 2023-10-15 MED ORDER — MENTHOL 3 MG MT LOZG: 1.0000 | LOZENGE | OROMUCOSAL | Status: DC | PRN

## 2023-10-15 MED ORDER — PROPOFOL 500 MG/50ML IV EMUL
INTRAVENOUS | Status: DC | PRN
  Administered 2023-10-15: 75 ug/kg/min via INTRAVENOUS

## 2023-10-15 MED ORDER — TRANEXAMIC ACID-NACL 1000-0.7 MG/100ML-% IV SOLN
1000.0000 mg | INTRAVENOUS | Status: AC
  Administered 2023-10-15: 1000 mg via INTRAVENOUS
  Filled 2023-10-15: qty 100

## 2023-10-15 MED ORDER — ONDANSETRON HCL 4 MG/2ML IJ SOLN
4.0000 mg | Freq: Four times a day (QID) | INTRAMUSCULAR | Status: DC | PRN
  Administered 2023-10-15: 4 mg via INTRAVENOUS
  Filled 2023-10-15: qty 2

## 2023-10-15 MED ORDER — CEFAZOLIN SODIUM-DEXTROSE 2-4 GM/100ML-% IV SOLN
2.0000 g | INTRAVENOUS | Status: AC
  Administered 2023-10-15: 2 g via INTRAVENOUS
  Filled 2023-10-15: qty 100

## 2023-10-15 MED ORDER — LACTATED RINGERS IV SOLN: INTRAVENOUS | Status: DC

## 2023-10-15 MED ORDER — ALBUMIN HUMAN 5 % IV SOLN: INTRAVENOUS | Status: DC | PRN

## 2023-10-15 MED ORDER — DEXAMETHASONE SODIUM PHOSPHATE 10 MG/ML IJ SOLN
INTRAMUSCULAR | Status: DC | PRN
  Administered 2023-10-15: 5 mg via INTRAVENOUS

## 2023-10-15 MED ORDER — CHLORHEXIDINE GLUCONATE 0.12 % MT SOLN
15.0000 mL | Freq: Once | OROMUCOSAL | Status: AC
  Administered 2023-10-15: 15 mL via OROMUCOSAL
  Filled 2023-10-15: qty 15

## 2023-10-15 MED ORDER — INSULIN ASPART 100 UNIT/ML IJ SOLN
0.0000 [IU] | Freq: Three times a day (TID) | INTRAMUSCULAR | Status: DC
  Administered 2023-10-16: 2 [IU] via SUBCUTANEOUS

## 2023-10-15 MED ORDER — POVIDONE-IODINE 10 % EX SWAB
2.0000 | Freq: Once | CUTANEOUS | Status: AC
  Administered 2023-10-15: 2 via TOPICAL

## 2023-10-15 MED ORDER — PHENYLEPHRINE HCL-NACL 20-0.9 MG/250ML-% IV SOLN
INTRAVENOUS | Status: DC | PRN
  Administered 2023-10-15: 20 ug/min via INTRAVENOUS

## 2023-10-15 MED ORDER — BUPIVACAINE-MELOXICAM ER 400-12 MG/14ML IJ SOLN
INTRAMUSCULAR | Status: AC
  Filled 2023-10-15: qty 1

## 2023-10-15 MED ORDER — MORPHINE SULFATE (PF) 2 MG/ML IV SOLN
0.5000 mg | INTRAVENOUS | Status: DC | PRN
  Administered 2023-10-15: 1 mg via INTRAVENOUS
  Filled 2023-10-15: qty 1

## 2023-10-15 MED ORDER — PROPOFOL 10 MG/ML IV BOLUS
INTRAVENOUS | Status: DC | PRN
  Administered 2023-10-15: 10 mg via INTRAVENOUS
  Administered 2023-10-15 (×2): 20 mg via INTRAVENOUS

## 2023-10-15 MED ORDER — RIVAROXABAN 10 MG PO TABS
10.0000 mg | ORAL_TABLET | Freq: Every day | ORAL | Status: DC
  Administered 2023-10-16: 10 mg via ORAL
  Filled 2023-10-15: qty 1

## 2023-10-15 MED ORDER — METOCLOPRAMIDE HCL 5 MG PO TABS: 5.0000 mg | ORAL_TABLET | Freq: Three times a day (TID) | ORAL | Status: DC | PRN

## 2023-10-15 MED ORDER — METOCLOPRAMIDE HCL 5 MG/ML IJ SOLN
5.0000 mg | Freq: Three times a day (TID) | INTRAMUSCULAR | Status: DC | PRN
Start: 2023-10-15 — End: 2023-10-16

## 2023-10-15 MED ORDER — MIDAZOLAM HCL 2 MG/2ML IJ SOLN
INTRAMUSCULAR | Status: AC
  Administered 2023-10-15: 2 mg via INTRAVENOUS
  Filled 2023-10-15: qty 2

## 2023-10-15 MED ORDER — SEMAGLUTIDE (2 MG/DOSE) 8 MG/3ML ~~LOC~~ SOPN: 2.0000 mg | PEN_INJECTOR | SUBCUTANEOUS | 2 refills | Status: DC

## 2023-10-15 MED ORDER — INSULIN ASPART 100 UNIT/ML IJ SOLN: 0.0000 [IU] | INTRAMUSCULAR | Status: DC | PRN

## 2023-10-15 MED ORDER — METHOCARBAMOL 1000 MG/10ML IJ SOLN: 500.0000 mg | Freq: Four times a day (QID) | INTRAMUSCULAR | Status: DC | PRN

## 2023-10-15 MED ORDER — LIDOCAINE 2% (20 MG/ML) 5 ML SYRINGE
INTRAMUSCULAR | Status: DC | PRN
  Administered 2023-10-15: 100 mg via INTRAVENOUS

## 2023-10-15 MED ORDER — MIDAZOLAM HCL 2 MG/2ML IJ SOLN
INTRAMUSCULAR | Status: DC | PRN
  Administered 2023-10-15 (×2): 1 mg via INTRAVENOUS

## 2023-10-15 MED ORDER — ONDANSETRON HCL 4 MG/2ML IJ SOLN
INTRAMUSCULAR | Status: DC | PRN
  Administered 2023-10-15: 4 mg via INTRAVENOUS

## 2023-10-15 MED ORDER — VANCOMYCIN HCL 1000 MG IV SOLR
INTRAVENOUS | Status: DC | PRN
  Administered 2023-10-15: 1000 mg via TOPICAL

## 2023-10-15 MED ORDER — ONDANSETRON HCL 4 MG PO TABS: 4.0000 mg | ORAL_TABLET | Freq: Four times a day (QID) | ORAL | Status: DC | PRN

## 2023-10-15 MED ORDER — FERROUS SULFATE 325 (65 FE) MG PO TABS
325.0000 mg | ORAL_TABLET | Freq: Every day | ORAL | Status: DC
Start: 2023-10-15 — End: 2023-10-16
  Administered 2023-10-15 – 2023-10-16 (×2): 325 mg via ORAL
  Filled 2023-10-15 (×2): qty 1

## 2023-10-15 MED ORDER — TRAMADOL HCL 50 MG PO TABS
50.0000 mg | ORAL_TABLET | Freq: Four times a day (QID) | ORAL | Status: DC
  Administered 2023-10-15 – 2023-10-16 (×4): 50 mg via ORAL
  Filled 2023-10-15 (×4): qty 1

## 2023-10-15 MED ORDER — AMLODIPINE BESYLATE-VALSARTAN 5-320 MG PO TABS: 1.0000 | ORAL_TABLET | Freq: Every day | ORAL | Status: DC

## 2023-10-15 MED ORDER — SODIUM CHLORIDE 0.9 % IR SOLN
Status: DC | PRN
  Administered 2023-10-15: 1000 mL

## 2023-10-15 MED ORDER — BUPIVACAINE IN DEXTROSE 0.75-8.25 % IT SOLN
INTRATHECAL | Status: DC | PRN
  Administered 2023-10-15: 1.8 mL via INTRATHECAL

## 2023-10-15 MED ORDER — TRANEXAMIC ACID-NACL 1000-0.7 MG/100ML-% IV SOLN
1000.0000 mg | Freq: Once | INTRAVENOUS | Status: AC
  Administered 2023-10-15: 1000 mg via INTRAVENOUS
  Filled 2023-10-15: qty 100

## 2023-10-15 MED ORDER — DOCUSATE SODIUM 100 MG PO CAPS: 100.0000 mg | ORAL_CAPSULE | Freq: Two times a day (BID) | ORAL | Status: DC

## 2023-10-15 MED ORDER — CEFAZOLIN SODIUM-DEXTROSE 2-4 GM/100ML-% IV SOLN
2.0000 g | Freq: Three times a day (TID) | INTRAVENOUS | Status: AC
  Administered 2023-10-15 – 2023-10-16 (×2): 2 g via INTRAVENOUS
  Filled 2023-10-15 (×2): qty 100

## 2023-10-15 MED ORDER — METHOCARBAMOL 500 MG PO TABS
500.0000 mg | ORAL_TABLET | Freq: Four times a day (QID) | ORAL | Status: DC | PRN
  Administered 2023-10-15 – 2023-10-16 (×3): 500 mg via ORAL
  Filled 2023-10-15 (×3): qty 1

## 2023-10-15 MED ORDER — PHENYLEPHRINE 80 MCG/ML (10ML) SYRINGE FOR IV PUSH (FOR BLOOD PRESSURE SUPPORT)
PREFILLED_SYRINGE | INTRAVENOUS | Status: DC | PRN
  Administered 2023-10-15: 80 ug via INTRAVENOUS

## 2023-10-15 MED ORDER — ACETAMINOPHEN 325 MG PO TABS: 650.0000 mg | ORAL_TABLET | Freq: Four times a day (QID) | ORAL | Status: DC | PRN

## 2023-10-15 MED ORDER — FENTANYL CITRATE (PF) 100 MCG/2ML IJ SOLN: 25.0000 ug | INTRAMUSCULAR | Status: DC | PRN

## 2023-10-15 MED ORDER — PRONTOSAN WOUND IRRIGATION OPTIME
TOPICAL | Status: DC | PRN
  Administered 2023-10-15: 1

## 2023-10-15 MED ORDER — AMISULPRIDE (ANTIEMETIC) 5 MG/2ML IV SOLN: 10.0000 mg | Freq: Once | INTRAVENOUS | Status: DC | PRN

## 2023-10-15 MED ORDER — VANCOMYCIN HCL 1000 MG IV SOLR
INTRAVENOUS | Status: AC
  Filled 2023-10-15: qty 20

## 2023-10-15 MED ORDER — PHENOL 1.4 % MT LIQD: 1.0000 | OROMUCOSAL | Status: DC | PRN

## 2023-10-15 MED ORDER — TRANEXAMIC ACID 1000 MG/10ML IV SOLN
INTRAVENOUS | Status: DC | PRN
  Administered 2023-10-15: 2000 mg via TOPICAL

## 2023-10-15 MED ORDER — AMLODIPINE BESYLATE 5 MG PO TABS
5.0000 mg | ORAL_TABLET | Freq: Every day | ORAL | Status: DC
  Administered 2023-10-15: 5 mg via ORAL
  Filled 2023-10-15: qty 1

## 2023-10-15 MED ORDER — DOXYCYCLINE HYCLATE 100 MG PO TABS
100.0000 mg | ORAL_TABLET | Freq: Two times a day (BID) | ORAL | Status: DC
  Administered 2023-10-16: 100 mg via ORAL
  Filled 2023-10-15: qty 1

## 2023-10-15 MED ORDER — CLONIDINE HCL (ANALGESIA) 100 MCG/ML EP SOLN
EPIDURAL | Status: DC | PRN
  Administered 2023-10-15: 50 ug

## 2023-10-15 MED ORDER — MIDAZOLAM HCL 2 MG/2ML IJ SOLN: 2.0000 mg | Freq: Once | INTRAMUSCULAR | Status: AC

## 2023-10-15 MED ORDER — 0.9 % SODIUM CHLORIDE (POUR BTL) OPTIME
TOPICAL | Status: DC | PRN
  Administered 2023-10-15: 1000 mL

## 2023-10-15 MED ORDER — IRBESARTAN 150 MG PO TABS
300.0000 mg | ORAL_TABLET | Freq: Every day | ORAL | Status: DC
  Administered 2023-10-15: 300 mg via ORAL
  Filled 2023-10-15: qty 2

## 2023-10-15 MED ORDER — DEXAMETHASONE SODIUM PHOSPHATE 10 MG/ML IJ SOLN
10.0000 mg | Freq: Once | INTRAMUSCULAR | Status: AC
  Administered 2023-10-16: 10 mg via INTRAVENOUS
  Filled 2023-10-15: qty 1

## 2023-10-15 SURGICAL SUPPLY — 78 items
ADH SKN CLS APL DERMABOND .7 (GAUZE/BANDAGES/DRESSINGS) ×1
BAG COUNTER SPONGE SURGICOUNT (BAG) ×1 IMPLANT
BAG DECANTER FOR FLEXI CONT (MISCELLANEOUS) ×1 IMPLANT
BAG SPNG CNTER NS LX DISP (BAG) ×1
BLADE AVERAGE 25X9 (BLADE) ×1 IMPLANT
BLADE CLIPPER SURG (BLADE) IMPLANT
BLADE SAG 18X100X1.27 (BLADE) ×1 IMPLANT
BLADE SAGITTAL 25.0X1.27X90 (BLADE) ×1 IMPLANT
BLADE SAW SAG 90X13X1.27 (BLADE) IMPLANT
BLADE SURG 10 STRL SS (BLADE) ×4 IMPLANT
BNDG CMPR MED 10X6 ELC LF (GAUZE/BANDAGES/DRESSINGS)
BNDG ELASTIC 6X10 VLCR STRL LF (GAUZE/BANDAGES/DRESSINGS) IMPLANT
BOWL SMART MIX CTS (DISPOSABLE) IMPLANT
CANISTER WOUND CARE 500ML ATS (WOUND CARE) IMPLANT
CEMENT BONE REFOBACIN R1X40 US (Cement) ×1 IMPLANT
CLSR STERI-STRIP ANTIMIC 1/2X4 (GAUZE/BANDAGES/DRESSINGS) IMPLANT
COMP MED POLY AS PERS S6-7 12 (Joint) ×1 IMPLANT
COMP TIB PS KNEE D 0D LT (Joint) ×1 IMPLANT
COMPONENT MED PLY PERSS6-7 12 (Joint) IMPLANT
COMPONET TIB PS KNEE D 0D LT (Joint) IMPLANT
COOLER ICEMAN CLASSIC (MISCELLANEOUS) ×1 IMPLANT
COVER SURGICAL LIGHT HANDLE (MISCELLANEOUS) ×1 IMPLANT
CUFF TOURN SGL QUICK 34 (TOURNIQUET CUFF) ×1
CUFF TOURN SGL QUICK 42 (TOURNIQUET CUFF) IMPLANT
CUFF TRNQT CYL 34X4.125X (TOURNIQUET CUFF) IMPLANT
DERMABOND ADVANCED .7 DNX12 (GAUZE/BANDAGES/DRESSINGS) IMPLANT
DRAPE EXTREMITY T 121X128X90 (DISPOSABLE) ×1 IMPLANT
DRAPE INCISE IOBAN 66X45 STRL (DRAPES) IMPLANT
DRAPE POUCH INSTRU U-SHP 10X18 (DRAPES) ×1 IMPLANT
DRAPE SURG ORHT 6 SPLT 77X108 (DRAPES) ×2 IMPLANT
DRESSING PEEL AND PLAC PRVNA20 (GAUZE/BANDAGES/DRESSINGS) IMPLANT
DRSG AQUACEL AG ADV 3.5X10 (GAUZE/BANDAGES/DRESSINGS) IMPLANT
DRSG PEEL AND PLACE PREVENA 20 (GAUZE/BANDAGES/DRESSINGS)
DURAPREP 26ML APPLICATOR (WOUND CARE) ×2 IMPLANT
ELECT CAUTERY BLADE 6.4 (BLADE) ×1 IMPLANT
ELECT REM PT RETURN 9FT ADLT (ELECTROSURGICAL) ×1
ELECTRODE REM PT RTRN 9FT ADLT (ELECTROSURGICAL) ×1 IMPLANT
GLOVE BIOGEL PI IND STRL 7.0 (GLOVE) IMPLANT
GLOVE ECLIPSE 7.0 STRL STRAW (GLOVE) ×1 IMPLANT
GLOVE INDICATOR 7.0 STRL GRN (GLOVE) ×1 IMPLANT
GLOVE INDICATOR 7.5 STRL GRN (GLOVE) ×1 IMPLANT
GLOVE SURG SYN 7.5 E (GLOVE) ×4 IMPLANT
GLOVE SURG SYN 7.5 PF PI (GLOVE) ×4 IMPLANT
GOWN STRL SURGICAL XL XLNG (GOWN DISPOSABLE) ×2 IMPLANT
GOWN TOGA ZIPPER T7+ PEEL AWAY (MISCELLANEOUS) ×2 IMPLANT
HANDPIECE INTERPULSE COAX TIP (DISPOSABLE) ×1
HEMOSTAT ARISTA ABSORB 3G PWDR (HEMOSTASIS) IMPLANT
HOOD PEEL AWAY T7 (MISCELLANEOUS) ×1 IMPLANT
KIT BASIN OR (CUSTOM PROCEDURE TRAY) ×1 IMPLANT
KIT TURNOVER KIT B (KITS) ×1 IMPLANT
KNEE SYSTEM FEMUR SZ 6 LT (Knees) IMPLANT
MANIFOLD NEPTUNE II (INSTRUMENTS) ×1 IMPLANT
MARKER SKIN DUAL TIP RULER LAB (MISCELLANEOUS) ×1 IMPLANT
NDL SPNL 18GX3.5 QUINCKE PK (NEEDLE) ×1 IMPLANT
NEEDLE SPNL 18GX3.5 QUINCKE PK (NEEDLE) ×1 IMPLANT
NS IRRIG 1000ML POUR BTL (IV SOLUTION) ×1 IMPLANT
PACK TOTAL JOINT (CUSTOM PROCEDURE TRAY) ×1 IMPLANT
PAD ARMBOARD 7.5X6 YLW CONV (MISCELLANEOUS) ×2 IMPLANT
PAD COLD SHLDR WRAP-ON (PAD) ×1 IMPLANT
PIN DRILL HDLS TROCAR 75 4PK (PIN) IMPLANT
SAW OSC TIP CART 19.5X105X1.3 (SAW) IMPLANT
SCREW FEMALE HEX FIX 25X2.5 (ORTHOPEDIC DISPOSABLE SUPPLIES) IMPLANT
SET HNDPC FAN SPRY TIP SCT (DISPOSABLE) IMPLANT
SOLUTION PRONTOSAN WOUND 350ML (IRRIGATION / IRRIGATOR) ×1 IMPLANT
SUCTION TUBE FRAZIER 10FR DISP (SUCTIONS) ×1 IMPLANT
SUT ETHILON 2 0 FS 18 (SUTURE) ×2 IMPLANT
SUT MNCRL AB 3-0 PS2 27 (SUTURE) IMPLANT
SUT VIC AB 0 CT1 27 (SUTURE) ×1
SUT VIC AB 0 CT1 27XBRD ANBCTR (SUTURE) ×1 IMPLANT
SUT VIC AB 1 CTX 36 (SUTURE) ×2
SUT VIC AB 1 CTX36XBRD ANBCTR (SUTURE) ×2 IMPLANT
SUT VIC AB 2-0 CT1 27 (SUTURE) ×2
SUT VIC AB 2-0 CT1 TAPERPNT 27 (SUTURE) ×2 IMPLANT
SYR 50ML LL SCALE MARK (SYRINGE) ×1 IMPLANT
TOWEL GREEN STERILE (TOWEL DISPOSABLE) ×1 IMPLANT
TOWEL GREEN STERILE FF (TOWEL DISPOSABLE) ×1 IMPLANT
TRAY FOLEY MTR SLVR 16FR STAT (SET/KITS/TRAYS/PACK) IMPLANT
TUBE SUCT ARGYLE STRL (TUBING) ×1 IMPLANT

## 2023-10-15 NOTE — Evaluation (Signed)
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 PMH - partial lt knee replacement  Clinical Impression  Pt admitted with above diagnosis and presents to PT with functional limitations due to deficits listed below (See PT problem list). Pt needs skilled PT to maximize independence and safety. Anticipate good progress and plans to return home with husband.           If plan is discharge home, recommend the following: Help with stairs or ramp for entrance;Assist for transportation;Assistance with cooking/housework   Can travel by private vehicle        Equipment Recommendations None recommended by PT  Recommendations for Other Services       Functional Status Assessment Patient has had a recent decline in their functional status and demonstrates the ability to make significant improvements in function in a reasonable and predictable amount of time.     Precautions / Restrictions Precautions Precautions: Knee Restrictions Weight Bearing Restrictions: Yes LLE Weight Bearing: Weight bearing as tolerated      Mobility  Bed Mobility Overal bed mobility: Modified Independent                  Transfers Overall transfer level: Needs assistance Equipment used: Rolling walker (2 wheels) Transfers: Sit to/from Stand Sit to Stand: Contact guard assist           General transfer comment: Assist for safety    Ambulation/Gait Ambulation/Gait assistance: Contact guard assist Gait Distance (Feet): 150 Feet Assistive device: Rolling walker (2 wheels) Gait Pattern/deviations: Step-through pattern, Decreased stride length Gait velocity: decr Gait velocity interpretation: 1.31 - 2.62 ft/sec, indicative of limited community ambulator   General Gait Details: Assist for safety.  Increasing stride length as distance increased. Nice heel to toe weight bearing  Stairs            Wheelchair Mobility     Tilt Bed    Modified Rankin (Stroke Patients Only)       Balance Overall balance assessment: No apparent balance deficits (not formally assessed)                                           Pertinent Vitals/Pain Pain Assessment Pain Assessment: Faces Faces Pain Scale: Hurts whole lot Pain Location: lt knee Pain Descriptors / Indicators: Grimacing, Guarding, Aching Pain Intervention(s): Limited activity within patient's tolerance, Monitored during session, Premedicated before session, Repositioned    Home Living Family/patient expects to be discharged to:: Private residence Living Arrangements: Spouse/significant other Available Help at Discharge: Family;Available 24 hours/day Type of Home: House Home Access: Stairs to enter Entrance Stairs-Rails: Left Entrance Stairs-Number of Steps: 4 Alternate Level Stairs-Number of Steps: flight Home Layout: Two level;Bed/bath upstairs;1/2 bath on main level Home Equipment: Rolling Walker (2 wheels);Toilet riser      Prior Function Prior Level of Function : Independent/Modified Independent;Driving             Mobility Comments: no assistive device       Extremity/Trunk Assessment   Upper Extremity Assessment Upper Extremity Assessment: Overall WFL for tasks assessed    Lower Extremity Assessment Lower Extremity Assessment: LLE deficits/detail LLE Deficits / Details: Good quad set, able to straight leg raise.  Knee ROM 0-70 degrees       Communication   Communication Communication: No apparent difficulties  Cognition Arousal: Alert Behavior During Therapy: WFL for tasks assessed/performed Overall Cognitive Status: Within Functional Limits for tasks assessed                                          General Comments      Exercises Total Joint  Exercises Quad Sets: AROM, Left, 5 reps, Supine Straight Leg Raises: AROM, Left, 5 reps, Supine Knee Flexion: AAROM, Left, 5 reps Goniometric ROM: 0-70   Assessment/Plan    PT Assessment Patient needs continued PT services  PT Problem List Decreased strength;Decreased range of motion;Decreased mobility;Pain       PT Treatment Interventions DME instruction;Gait training;Stair training;Functional mobility training;Therapeutic activities;Therapeutic exercise;Patient/family education    PT Goals (Current goals can be found in the Care Plan section)  Acute Rehab PT Goals Patient Stated Goal: improve knee function PT Goal Formulation: With patient Time For Goal Achievement: 10/22/23 Potential to Achieve Goals: Good    Frequency 7X/week     Co-evaluation               AM-PAC PT "6 Clicks" Mobility  Outcome Measure Help needed turning from your back to your side while in a flat bed without using bedrails?: None Help needed moving from lying on your back to sitting on the side of a flat bed without using bedrails?: None Help needed moving to and from a bed to a chair (including a wheelchair)?: A Little Help needed standing up from a chair using your arms (e.g., wheelchair or bedside chair)?: A Little Help needed to walk in hospital room?: A Little Help needed climbing 3-5 steps with a railing? : A Little 6 Click Score: 20    End of Session Equipment Utilized During Treatment: Gait belt Activity Tolerance: Patient tolerated treatment well Patient left: in chair;with call bell/phone within reach;with family/visitor present Nurse Communication: Mobility status PT Visit Diagnosis: Other abnormalities of gait and mobility (R26.89);Pain Pain - Right/Left: Left Pain - part of body: Knee    Time: 4098-1191 PT Time Calculation (min) (ACUTE ONLY): 26 min   Charges:   PT Evaluation $PT Eval Low Complexity: 1 Low PT Treatments $Gait Training: 8-22 mins PT General Charges $$  ACUTE PT VISIT: 1 Visit         [facility] PT Acute Rehabilitation Services Office [phone]   Angelina Ok [facility] 10/15/2023, 4:06 PM

## 2023-10-15 NOTE — H&P (Signed)
PREOPERATIVE H&P  Chief Complaint: left knee osteoarthritis  HPI: Lauren [name] is a 62 y.o. female who presents for surgical treatment of left knee osteoarthritis.  She denies any changes in medical history.  Past Surgical History:  Procedure Laterality Date   CATARACT EXTRACTION Bilateral    COLONOSCOPY     GASTRIC BYPASS     KNEE SURGERY Left 2017   LIVER BIOPSY  01/2022   Novosure     TUBAL LIGATION     Social History   Socioeconomic History   Marital status: Married    Spouse name: Not on file   Number of children: Not on file   Years of education: Not on file   Highest education level: Not on file  Occupational History   Not on file  Tobacco Use   Smoking status: Former    Current packs/day: 0.00    Average packs/day: 1.3 packs/day for 22.0 years (27.5 ttl pk-yrs)    Types: E-cigarettes, Cigarettes    Start date: 73    Quit date: 2002    Years since quitting: 22.8   Smokeless tobacco: Never   Tobacco comments:    Stopped smoking 38yrs ago  Vaping Use   Vaping status: Never Used  Substance and Sexual Activity   Alcohol use: Yes    Comment: Occ.   Drug use: Not Currently    Comment: gummies   Sexual activity: Yes  Other Topics Concern   Not on file  Social History Narrative   Married.  Works for [employer].  Walking for exercise.   Social Determinants of Health   Financial Resource Strain: Not on file  Food Insecurity: Low Risk  (03/01/2023)   [facility]   Hunger Vital Sign    Worried About Running Out of Food in the Last Year: Never true    Ran Out of Food in the Last Year: Never true  Transportation Needs: Not on file (03/01/2023)  Physical Activity: Not on file  Stress: Not on file  Social Connections: Not on file   Family History  Problem Relation Age of Onset   Breast cancer Mother    Breast cancer Maternal Grandmother    Diabetes Mellitus II Paternal Grandfather    Throat cancer Paternal Grandfather     Stomach cancer Neg Hx    Esophageal cancer Neg Hx    Pancreatic cancer Neg Hx    Colon cancer Neg Hx    Allergies  Allergen Reactions   Oxycodone Itching   Prior to Admission medications   Medication Sig Start Date End Date Taking? Authorizing Provider  allopurinol (ZYLOPRIM) 100 MG tablet Take 2 tablets (200 mg total) by mouth daily. 07/12/23  Yes Tysinger, Kermit Balo, PA-C  amLODipine-valsartan (EXFORGE) 5-320 MG tablet Take 1 tablet by mouth daily.   Yes [provider]  colchicine 0.6 MG tablet 1 tablet BID prn for gout flare 07/12/23  Yes Tysinger, Kermit Balo, PA-C  FARXIGA 10 MG TABS tablet Take 10 mg by mouth daily. 10/16/22  Yes [provider]  Finerenone (KERENDIA) 10 MG TABS Take 10 mg by mouth daily. 03/28/23  Yes [provider]  LUMIGAN 0.01 % SOLN Place 1 drop into both eyes at bedtime. 08/11/21  Yes [provider]  rosuvastatin (CRESTOR) 40 MG tablet Take 1 tablet (40 mg total) by mouth daily. 07/12/23  Yes Tysinger, Kermit Balo, PA-C  Semaglutide, 1 MG/DOSE, 4 MG/3ML SOPN Inject 1 mg as directed once a  week. 07/12/23  Yes Tysinger, Kermit Balo, PA-C  ursodiol (ACTIGALL) 300 MG capsule Take 600 mg by mouth 2 (two) times daily.   Yes [provider]  Continuous Glucose Sensor (FREESTYLE LIBRE 2 SENSOR) MISC Use on back of arm 07/12/23   Tysinger, Kermit Balo, PA-C  Cyanocobalamin (B-12 SL) Place 3 capsules under the tongue daily.    [provider]  docusate sodium (COLACE) 100 MG capsule Take 1 capsule (100 mg total) by mouth daily as needed. 10/08/23 10/07/24  Cristie Hem, PA-C  docusate sodium (COLACE) 100 MG capsule Take 1 capsule (100 mg total) by mouth daily as needed. 10/12/23 10/11/24  Cristie Hem, PA-C  doxycycline (VIBRA-TABS) 100 MG tablet Take 1 tablet (100 mg total) by mouth 2 (two) times daily. To be taken after surgery 10/08/23   Cristie Hem, PA-C  doxycycline (VIBRAMYCIN) 100 MG capsule Take 1 capsule (100 mg total) by mouth  2 (two) times daily. To be taken after surgery 10/12/23   Cristie Hem, PA-C  HYDROcodone-acetaminophen [facility]) 7.5-325 MG tablet Take 1-2 tablets by mouth every 6 (six) hours as needed for moderate pain (pain score 4-6). To be taken after surgery 10/08/23   Cristie Hem, PA-C  HYDROcodone-acetaminophen [facility]) 7.5-325 MG tablet Take 1-2 tablets by mouth every 6 (six) hours as needed for moderate pain (pain score 4-6). 10/12/23   Cristie Hem, PA-C  methocarbamol (ROBAXIN-750) 750 MG tablet Take 1 tablet (750 mg total) by mouth 2 (two) times daily as needed for muscle spasms. 10/08/23   Cristie Hem, PA-C  methocarbamol (ROBAXIN-750) 750 MG tablet Take 1 tablet (750 mg total) by mouth 2 (two) times daily as needed for muscle spasms. 10/12/23   Cristie Hem, PA-C  Multiple Vitamin (MULTIVITAMIN) capsule Take 1 capsule by mouth daily.    [provider]  ondansetron (ZOFRAN) 4 MG tablet Take 1 tablet (4 mg total) by mouth every 8 (eight) hours as needed for nausea or vomiting. 10/08/23   Cristie Hem, PA-C  ondansetron (ZOFRAN) 4 MG tablet Take 1 tablet (4 mg total) by mouth every 8 (eight) hours as needed for nausea or vomiting. 10/12/23   Cristie Hem, PA-C  rivaroxaban (XARELTO) 10 MG TABS tablet Take 1 tablet (10 mg total) by mouth daily. To be taken after surgery to prevent blood clots 10/08/23   Cristie Hem, PA-C  rivaroxaban (XARELTO) 10 MG TABS tablet Take 1 tablet (10 mg total) by mouth daily. To be taken after surgery 10/12/23   Cristie Hem, PA-C     Positive ROS: All other systems have been reviewed and were otherwise negative with the exception of those mentioned in the HPI and as above.  Physical Exam: General: Alert, no acute distress Cardiovascular: No pedal edema Respiratory: No cyanosis, no use of accessory musculature GI: abdomen soft Skin: No lesions in the area of chief complaint Neurologic: Sensation intact distally Psychiatric:  Patient is competent for consent with normal mood and affect Lymphatic: no lymphedema  MUSCULOSKELETAL: exam stable  Assessment: left knee osteoarthritis  Plan: Plan for Procedure(s): REVISION LEFT PARTIAL KNEE to TOTAL KNEE ARTHROPLASTY  The risks benefits and alternatives were discussed with the patient including but not limited to the risks of nonoperative treatment, versus surgical intervention including infection, bleeding, nerve injury,  blood clots, cardiopulmonary complications, morbidity, mortality, among others, and they were willing to proceed.   Glee Arvin, MD 10/15/2023 7:05 AM

## 2023-10-15 NOTE — Discharge Instructions (Signed)

## 2023-10-15 NOTE — Plan of Care (Signed)
  Problem: Education: Goal: Knowledge of General Education information will improve Description: Including pain rating scale, medication(s)/side effects and non-pharmacologic comfort measures Outcome: Progressing   Problem: Health Behavior/Discharge Planning: Goal: Ability to manage health-related needs will improve Outcome: Progressing   Problem: Clinical Measurements: Goal: Ability to maintain clinical measurements within normal limits will improve Outcome: Progressing Goal: Will remain free from infection Outcome: Progressing Goal: Diagnostic test results will improve Outcome: Progressing Goal: Respiratory complications will improve Outcome: Progressing Goal: Cardiovascular complication will be avoided Outcome: Progressing   Problem: Activity: Goal: Risk for activity intolerance will decrease Outcome: Progressing   Problem: Nutrition: Goal: Adequate nutrition will be maintained Outcome: Progressing   Problem: Coping: Goal: Level of anxiety will decrease Outcome: Progressing   Problem: Elimination: Goal: Will not experience complications related to bowel motility Outcome: Progressing Goal: Will not experience complications related to urinary retention Outcome: Progressing   Problem: Pain Management: Goal: General experience of comfort will improve Outcome: Progressing   Problem: Safety: Goal: Ability to remain free from injury will improve Outcome: Progressing   Problem: Skin Integrity: Goal: Risk for impaired skin integrity will decrease Outcome: Progressing   Problem: Education: Goal: Knowledge of the prescribed therapeutic regimen will improve Outcome: Progressing Goal: Individualized Educational Video(s) Outcome: Progressing   Problem: Activity: Goal: Ability to avoid complications of mobility impairment will improve Outcome: Progressing Goal: Range of joint motion will improve Outcome: Progressing   Problem: Clinical Measurements: Goal:  Postoperative complications will be avoided or minimized Outcome: Progressing   Problem: Pain Management: Goal: Pain level will decrease with appropriate interventions Outcome: Progressing   Problem: Skin Integrity: Goal: Will show signs of wound healing Outcome: Progressing

## 2023-10-15 NOTE — Anesthesia Preprocedure Evaluation (Signed)
Anesthesia Evaluation  Patient identified by MRN, date of birth, ID band Patient awake    Reviewed: Allergy & Precautions, NPO status , Patient's Chart, lab work & pertinent test results  History of Anesthesia Complications (+) PONV and history of anesthetic complications  Airway Mallampati: II  TM Distance: >3 FB Neck ROM: Full    Dental   Pulmonary sleep apnea , former smoker   Pulmonary exam normal        Cardiovascular hypertension, Pt. on medications  Rhythm:Regular Rate:Normal     Neuro/Psych negative neurological ROS     GI/Hepatic ,GERD  ,,(+) Hepatitis -  Endo/Other  diabetes, Type 2Hypothyroidism    Renal/GU CRFRenal disease     Musculoskeletal   Abdominal   Peds  Hematology  (+) Blood dyscrasia, anemia   Anesthesia Other Findings   Reproductive/Obstetrics                             Anesthesia Physical Anesthesia Plan  ASA: 2  Anesthesia Plan:    Post-op Pain Management: Regional block* and Tylenol PO (pre-op)*   Induction:   PONV Risk Score and Plan: 3 and Ondansetron, Dexamethasone, Propofol infusion and Midazolam  Airway Management Planned: Natural Airway and Simple Face Mask  Additional Equipment:   Intra-op Plan:   Post-operative Plan:   Informed Consent: I have reviewed the patients History and Physical, chart, labs and discussed the procedure including the risks, benefits and alternatives for the proposed anesthesia with the patient or authorized representative who has indicated his/her understanding and acceptance.       Plan Discussed with: CRNA  Anesthesia Plan Comments:         Anesthesia Quick Evaluation

## 2023-10-15 NOTE — Anesthesia Procedure Notes (Signed)
Anesthesia Regional Block: Adductor canal block   Pre-Anesthetic Checklist: , timeout performed,  Correct Patient, Correct Site, Correct Laterality,  Correct Procedure, Correct Position, site marked,  Risks and benefits discussed,  Surgical consent,  Pre-op evaluation,  At surgeon's request and post-op pain management  Laterality: Left  Prep: chloraprep       Needles:  Injection technique: Single-shot  Needle Type: Echogenic Needle     Needle Length: 9cm  Needle Gauge: 21     Additional Needles:   Procedures:,,,, ultrasound used (permanent image in chart),,    Narrative:  Start time: 10/15/2023 9:08 AM End time: 10/15/2023 9:15 AM Injection made incrementally with aspirations every 5 mL.  Performed by: Personally  Anesthesiologist: Marcene Duos, MD

## 2023-10-15 NOTE — Op Note (Signed)
Total Knee Arthroplasty Procedure Note  Preoperative diagnosis: Left knee osteoarthritis status post patellofemoral arthroplasty  Postoperative diagnosis:same  Operative findings: High grade chondromalacia of medial compartment Intact patellofemoral arthroplasty without loosening  Operative procedure: Conversion of prior left patellofemoral arthroplasty to total knee arthroplasty  Surgeon: N. Glee Arvin, MD  Assist: Oneal Grout, PA-C; necessary for the timely completion of procedure and due to complexity of procedure.  Anesthesia: Spinal, regional, local  Tourniquet time: see anesthesia record  Implants used: Zimmer persona Femur: CR 6 narrow Tibia: D Polyethylene: 12 mm medial congruent  Indication: Lauren [name] is a 62 y.o. year old female with a history of left knee pain status post prior patellofemoral arthroplasty that was performed in [place] about 5 years ago. Having failed conservative management, the patient elected to proceed with revision surgery to a total knee arthroplasty.  We have reviewed the risk and benefits of the surgery and they elected to proceed after voicing understanding.  Procedure:  After informed consent was obtained and understanding of the risk were voiced including but not limited to bleeding, infection, damage to surrounding structures including nerves and vessels, blood clots, leg length inequality and the failure to achieve desired results, the operative extremity was marked with verbal confirmation of the patient in the holding area.   The patient was then brought to the operating room and transported to the operating room table in the supine position.  A tourniquet was applied to the operative extremity around the upper thigh. The operative limb was then prepped and draped in the usual sterile fashion and preoperative antibiotics were administered.  A time out was performed prior to the start of surgery confirming the correct extremity,  preoperative antibiotic administration, as well as team members, implants and instruments available for the case. Correct surgical site was also confirmed with preoperative radiographs. The limb was then elevated for exsanguination and the tourniquet was inflated. The previous surgical scar was excised sharply and full thickness flaps were raised medially and laterally and a standard medial parapatellar approach was performed.  There was no evidence of infection.  The infrapatellar fat pad was removed.  Circumferential synovectomy was performed.   Patelloplasty was performed.  Bony growths were rongeured off the patella.  The patellar button was well fixed.  A medial peel was performed to release the capsule and the deep MCL off of the medial tibial plateau back to the semimembranosus.  The knee was then brought into full flexion and we then turned our attention to the femur.  The ACL was sacrificed.  The interface between the femoral component and the bone was disrupted with a 1/4 inch osteotome and the implant was removed very easily without any loss of bone.  The remaining cement was rongeured away.  Start site was drilled in the femur and the intramedullary distal femoral cutting guide was placed, set at 5 degrees valgus, taking 10 mm of distal resection. The distal cut was made. Osteophytes were then removed.  Next, the proximal tibial cutting guide was placed with appropriate slope, varus/valgus alignment and depth of resection.  The drop rod was attached to confirm that it was aimed at the second metatarsal.  The proximal tibial cut was made taking 10 mm off the higher lateral side. Gap blocks were then used to assess the extension gap and alignment, and appropriate soft tissue releases were performed. Attention was turned back to the femur, which was sized using the sizing guide to a size 6. Appropriate rotation  of the femoral component was determined using epicondylar axis, Whiteside's line, and assessing  the flexion gap under ligament tension. The appropriate size 4-in-1 cutting block was placed and checked with an angel wing and cuts were made. Posterior femoral osteophytes and uncapped bone were then removed with the curved osteotome.  The menisci were removed.  Trial components were placed, and stability was checked in full extension, mid-flexion, and deep flexion.  PCL was sacrified because it was tight in flexion.  I could get her motion from 0 to 130 with gravity. Proper tibial rotation was determined and marked.  The patella tracked well without a lateral release.  The femoral lugs were then drilled. Trial components were then removed and tibial preparation performed.  The trial tibia was pointed to the medial third of the tibial tubercle.  The tibia was sized for a size D component.   The bony surfaces were irrigated with a pulse lavage and then dried. Bone cement was vacuum mixed on the back table, and the final components sized above were cemented into place.  Antibiotic irrigation was placed in the knee joint and soft tissues while the cement cured.  After cement had finished curing, excess cement was removed. The stability of the construct was re-evaluated throughout a range of motion and found to be acceptable. The trial liner was removed, the knee was copiously irrigated, and the knee was re-evaluated for any excess bone debris. The real polyethylene liner, 12 mm thick, was inserted and checked to ensure the locking mechanism had engaged appropriately. The tourniquet was deflated and hemostasis was achieved. The wound was irrigated with normal saline.  One gram of vancomycin powder was placed in the surgical bed.  Topical mixture of 0.25% bupivacaine and meloxicam was placed in the joint for postoperative pain.  Capsular closure was performed with a #1 vicryl in flexion, subcutaneous fat closed with a 0 vicryl suture, then subcutaneous tissue closed with interrupted 2.0 vicryl suture. The skin was  then closed with a 2.0 nylon and dermabond. A sterile dressing was applied.  The patient was awakened in the operating room and taken to recovery in stable condition. All sponge, needle, and instrument counts were correct at the end of the case.  Lauren [name] was necessary for opening, closing, retracting, limb positioning and overall facilitation and completion of the surgery.  Position: supine  Complications: none.  Time Out: performed   Drains/Packing: none  Estimated blood loss: minimal  Returned to Recovery Room: in good condition.   Antibiotics: yes   Mechanical VTE (DVT) Prophylaxis: sequential compression devices, TED thigh-high  Chemical VTE (DVT) Prophylaxis: xarelto POD 1  Fluid Replacement  Crystalloid: see anesthesia record Blood: none  FFP: none   Specimens Removed: 1 to pathology   Sponge and Instrument Count Correct? yes   PACU: portable radiograph - knee AP and Lateral   Plan/RTC: Return in 2 weeks for wound check.   Weight Bearing/Load Lower Extremity: full   Implant Name Type Inv. Item Serial No. Manufacturer Lot No. LRB No. Used Action  CEMENT BONE REFOBACIN [id number] [place] - [id number] Cement CEMENT BONE REFOBACIN R1X40 [place]  ZIMMER RECON(ORTH,TRAU,BIO,SG) [id number] Left 1 Implanted  COMP MED POLY AS PERS S6-7 12 - [id number] Joint COMP MED POLY AS PERS S6-7 12  ZIMMER RECON(ORTH,TRAU,BIO,SG) [id number] Left 1 Implanted  COMP TIB PS KNEE D 0D LT - [id number] Joint COMP TIB PS KNEE D 0D LT  ZIMMER RECON(ORTH,TRAU,BIO,SG) [id number] Left 1 Implanted  KNEE SYSTEM FEMUR SZ 6  LT - [id number] Knees KNEE SYSTEM FEMUR SZ 6 LT  ZIMMER RECON(ORTH,TRAU,BIO,SG) [id number] Left 1 Implanted    N. Glee Arvin, MD [facility] 11:19 AM

## 2023-10-15 NOTE — Transfer of Care (Signed)
Immediate Anesthesia Transfer of Care Note  Patient: Lauren [name]  Procedure(s) Performed: REVISION LEFT PARTIAL KNEE to TOTAL KNEE ARTHROPLASTY (Left: Knee)  Patient Location: PACU  Anesthesia Type:MAC combined with regional for post-op pain  Level of Consciousness: awake, alert , oriented, and patient cooperative  Airway & Oxygen Therapy: Patient Spontanous Breathing and Patient connected to nasal cannula oxygen  Post-op Assessment: Report given to RN and Post -op Vital signs reviewed and stable  Post vital signs: Reviewed and stable  Last Vitals:  Vitals Value Taken Time  BP 110/77 10/15/23 1204  Temp    Pulse 71 10/15/23 1208  Resp 19 10/15/23 1208  SpO2 99 % 10/15/23 1208  Vitals shown include unfiled device data.  Last Pain:  Vitals:   10/15/23 0747  TempSrc:   PainSc: 0-No pain         Complications: No notable events documented.

## 2023-10-15 NOTE — Anesthesia Procedure Notes (Signed)
Spinal  Patient location during procedure: OR Start time: 10/15/2023 9:53 AM End time: 10/15/2023 9:58 AM Reason for block: surgical anesthesia Staffing Performed: anesthesiologist  Anesthesiologist: Marcene Duos, MD Performed by: Marcene Duos, MD Authorized by: Marcene Duos, MD   Preanesthetic Checklist Completed: patient identified, IV checked, site marked, risks and benefits discussed, surgical consent, monitors and equipment checked, pre-op evaluation and timeout performed Spinal Block Patient position: sitting Prep: DuraPrep Patient monitoring: heart rate, cardiac monitor, continuous pulse ox and blood pressure Approach: midline Location: L4-5 Injection technique: single-shot Needle Needle type: Pencan  Needle gauge: 24 G Needle length: 9 cm Assessment Sensory level: T4 Events: CSF return

## 2023-10-16 ENCOUNTER — Telehealth: Payer: Self-pay | Admitting: Medical

## 2023-10-16 DIAGNOSIS — M1712 Unilateral primary osteoarthritis, left knee: Secondary | ICD-10-CM | POA: Diagnosis not present

## 2023-10-16 LAB — GLUCOSE, CAPILLARY: Glucose-Capillary: 129 mg/dL — ABNORMAL HIGH (ref 70–99)

## 2023-10-16 NOTE — Plan of Care (Signed)
  Problem: Education: Goal: Knowledge of General Education information will improve Description: Including pain rating scale, medication(s)/side effects and non-pharmacologic comfort measures Outcome: Completed/Met   Problem: Health Behavior/Discharge Planning: Goal: Ability to manage health-related needs will improve Outcome: Completed/Met   Problem: Clinical Measurements: Goal: Ability to maintain clinical measurements within normal limits will improve Outcome: Completed/Met Goal: Will remain free from infection Outcome: Completed/Met Goal: Diagnostic test results will improve Outcome: Completed/Met Goal: Respiratory complications will improve Outcome: Completed/Met Goal: Cardiovascular complication will be avoided Outcome: Completed/Met   Problem: Activity: Goal: Risk for activity intolerance will decrease Outcome: Completed/Met   Problem: Nutrition: Goal: Adequate nutrition will be maintained Outcome: Completed/Met   Problem: Coping: Goal: Level of anxiety will decrease Outcome: Completed/Met   Problem: Elimination: Goal: Will not experience complications related to bowel motility Outcome: Completed/Met Goal: Will not experience complications related to urinary retention Outcome: Completed/Met   Problem: Pain Management: Goal: General experience of comfort will improve Outcome: Completed/Met   Problem: Safety: Goal: Ability to remain free from injury will improve Outcome: Completed/Met   Problem: Skin Integrity: Goal: Risk for impaired skin integrity will decrease Outcome: Completed/Met   Problem: Education: Goal: Knowledge of the prescribed therapeutic regimen will improve Outcome: Completed/Met Goal: Individualized Educational Video(s) Outcome: Completed/Met   Problem: Activity: Goal: Ability to avoid complications of mobility impairment will improve Outcome: Completed/Met Goal: Range of joint motion will improve Outcome: Completed/Met   Problem:  Clinical Measurements: Goal: Postoperative complications will be avoided or minimized Outcome: Completed/Met   Problem: Pain Management: Goal: Pain level will decrease with appropriate interventions Outcome: Completed/Met   Problem: Skin Integrity: Goal: Will show signs of wound healing Outcome: Completed/Met   Problem: Acute Rehab PT Goals(only PT should resolve) Goal: Patient Will Transfer Sit To/From Stand Outcome: Completed/Met Goal: Pt Will Ambulate Outcome: Completed/Met Goal: Pt Will Go Up/Down Stairs Outcome: Completed/Met   Problem: Education: Goal: Ability to describe self-care measures that may prevent or decrease complications (Diabetes Survival Skills Education) will improve Outcome: Completed/Met Goal: Individualized Educational Video(s) Outcome: Completed/Met   Problem: Coping: Goal: Ability to adjust to condition or change in health will improve Outcome: Completed/Met   Problem: Fluid Volume: Goal: Ability to maintain a balanced intake and output will improve Outcome: Completed/Met   Problem: Health Behavior/Discharge Planning: Goal: Ability to identify and utilize available resources and services will improve Outcome: Completed/Met Goal: Ability to manage health-related needs will improve Outcome: Completed/Met   Problem: Metabolic: Goal: Ability to maintain appropriate glucose levels will improve Outcome: Completed/Met   Problem: Nutritional: Goal: Maintenance of adequate nutrition will improve Outcome: Completed/Met Goal: Progress toward achieving an optimal weight will improve Outcome: Completed/Met   Problem: Skin Integrity: Goal: Risk for impaired skin integrity will decrease Outcome: Completed/Met   Problem: Tissue Perfusion: Goal: Adequacy of tissue perfusion will improve Outcome: Completed/Met

## 2023-10-16 NOTE — Discharge Summary (Signed)
Patient ID: Lauren [name] MRN: [id number] DOB/AGE: July 08, 1961 62 y.o.  Admit date: 10/15/2023 Discharge date: 10/16/2023  Admission Diagnoses:  Principal Problem:   Primary osteoarthritis of left knee Active Problems:   Status post left partial knee replacement   Status post total left knee replacement   Discharge Diagnoses:  Same  Past Medical History:  Diagnosis Date   Anemia    CKD stage 3b, GFR 30-44 ml/min (HCC)    Complication of anesthesia    Diabetes mellitus, type 2 (HCC)    Eczema    GERD (gastroesophageal reflux disease)    Glaucoma    Gout    History of kidney stones    Hyperlipidemia    Hypertension    Metabolic dysfunction-associated steatohepatitis (MASH)    PONV (postoperative nausea and vomiting)    Primary biliary cholangitis (HCC)    Sarcoidosis    Sleep apnea    Thyroid cancer (HCC)     Surgeries: Procedure(s): REVISION LEFT PARTIAL KNEE to TOTAL KNEE ARTHROPLASTY on 10/15/2023   Consultants:   Discharged Condition: Improved  Hospital Course: [name] is an 62 y.o. female who was admitted 10/15/2023 for operative treatment ofPrimary osteoarthritis of left knee. Patient has severe unremitting pain that affects sleep, daily activities, and work/hobbies. After pre-op clearance the patient was taken to the operating room on 10/15/2023 and underwent  Procedure(s): REVISION LEFT PARTIAL KNEE to TOTAL KNEE ARTHROPLASTY.    Patient was given perioperative antibiotics:  Anti-infectives (From admission, onward)    Start     Dose/Rate Route Frequency Ordered Stop   10/16/23 1000  doxycycline (VIBRA-TABS) tablet 100 mg       Note to Pharmacy: To be taken after surgery     100 mg Oral 2 times daily 10/15/23 1344     10/15/23 2000  ceFAZolin (ANCEF) IVPB 2g/100 mL premix        2 g 200 mL/hr over 30 Minutes Intravenous Every 8 hours 10/15/23 1344 10/16/23 0336   10/15/23 1042  vancomycin (VANCOCIN) powder  Status:  Discontinued          As needed 10/15/23  1042 10/15/23 1200   10/15/23 0730  ceFAZolin (ANCEF) IVPB 2g/100 mL premix        2 g 200 mL/hr over 30 Minutes Intravenous On call to O.R. 10/15/23 4696 10/15/23 0956        Patient was given sequential compression devices, early ambulation, and chemoprophylaxis to prevent DVT.  Patient benefited maximally from hospital stay and there were no complications.    Recent vital signs: Patient Vitals for the past 24 hrs:  BP Temp Temp src Pulse Resp SpO2  10/16/23 0752 124/84 98.4 F (36.9 C) Oral 69 16 100 %  10/16/23 0420 128/80 98.5 F (36.9 C) Oral 72 -- 100 %  10/16/23 0004 116/83 98.5 F (36.9 C) Oral 69 20 100 %  10/15/23 1955 113/64 98.7 F (37.1 C) Oral 74 20 93 %  10/15/23 1628 (!) 140/96 98.7 F (37.1 C) Oral 72 20 100 %  10/15/23 1359 122/86 97.7 F (36.5 C) Oral 65 20 99 %  10/15/23 1345 116/87 -- -- 60 16 99 %  10/15/23 1330 130/87 -- -- (!) 59 15 100 %  10/15/23 1315 138/85 -- -- 61 18 98 %  10/15/23 1300 136/80 -- -- (!) 55 16 100 %  10/15/23 1245 128/87 -- -- (!) 53 10 99 %  10/15/23 1230 133/85 -- -- 64 17 99 %  10/15/23 1215 121/80 -- --  66 10 100 %  10/15/23 1204 110/77 98 F (36.7 C) -- 72 (!) 22 99 %     Recent laboratory studies: No results for input(s): "WBC", "HGB", "HCT", "PLT", "NA", "K", "CL", "CO2", "BUN", "CREATININE", "GLUCOSE", "INR", "CALCIUM" in the last 72 hours.  Invalid input(s): "PT", "2"   Discharge Medications:   Allergies as of 10/16/2023       Reactions   Oxycodone Itching        Medication List     TAKE these medications    allopurinol 100 MG tablet Commonly known as: ZYLOPRIM Take 2 tablets (200 mg total) by mouth daily.   amLODipine-valsartan 5-320 MG tablet Commonly known as: EXFORGE Take 1 tablet by mouth daily.   B-12 SL Place 3 capsules under the tongue daily.   colchicine 0.6 MG tablet 1 tablet BID prn for gout flare   docusate sodium 100 MG capsule Commonly known as: Colace Take 1 capsule (100 mg  total) by mouth daily as needed. What changed: Another medication with the same name was removed. Continue taking this medication, and follow the directions you see here.   doxycycline 100 MG capsule Commonly known as: Vibramycin Take 1 capsule (100 mg total) by mouth 2 (two) times daily. To be taken after surgery What changed: Another medication with the same name was removed. Continue taking this medication, and follow the directions you see here.   Farxiga 10 MG Tabs tablet Generic drug: dapagliflozin propanediol Take 10 mg by mouth daily.   FreeStyle Libre 2 Sensor Misc Use on back of arm   HYDROcodone-acetaminophen 7.5-325 MG tablet Commonly known as: Norco Take 1-2 tablets by mouth every 6 (six) hours as needed for moderate pain (pain score 4-6). What changed: Another medication with the same name was removed. Continue taking this medication, and follow the directions you see here.   Kerendia 10 MG Tabs Generic drug: Finerenone Take 10 mg by mouth daily.   Lumigan 0.01 % Soln Generic drug: bimatoprost Place 1 drop into both eyes at bedtime.   methocarbamol 750 MG tablet Commonly known as: Robaxin-750 Take 1 tablet (750 mg total) by mouth 2 (two) times daily as needed for muscle spasms. What changed: Another medication with the same name was removed. Continue taking this medication, and follow the directions you see here.   multivitamin capsule Take 1 capsule by mouth daily.   ondansetron 4 MG tablet Commonly known as: Zofran Take 1 tablet (4 mg total) by mouth every 8 (eight) hours as needed for nausea or vomiting. What changed: Another medication with the same name was removed. Continue taking this medication, and follow the directions you see here.   rivaroxaban 10 MG Tabs tablet Commonly known as: XARELTO Take 1 tablet (10 mg total) by mouth daily. To be taken after surgery What changed: Another medication with the same name was removed. Continue taking this  medication, and follow the directions you see here.   rosuvastatin 40 MG tablet Commonly known as: Crestor Take 1 tablet (40 mg total) by mouth daily.   Semaglutide (2 MG/DOSE) 8 MG/3ML Sopn Inject 2 mg into the skin once a week.   ursodiol 300 MG capsule Commonly known as: ACTIGALL Take 600 mg by mouth 2 (two) times daily.               Durable Medical Equipment  (From admission, onward)           Start     Ordered   10/15/23 1345  DME Walker rolling  Once       Question Answer Comment  Walker: With 5 Inch Wheels   Patient needs a walker to treat with the following condition Status post left partial knee replacement      10/15/23 1344   10/15/23 1345  DME 3 n 1  Once        10/15/23 1344   10/15/23 1345  DME Bedside commode  Once       Question:  Patient needs a bedside commode to treat with the following condition  Answer:  Status post left partial knee replacement   10/15/23 1344            Diagnostic Studies: DG Knee Left Port  Result Date: 10/15/2023 CLINICAL DATA:  Postop left knee replacement EXAM: PORTABLE LEFT KNEE - 1-2 VIEW COMPARISON:  None Available. FINDINGS: Left knee arthroplasty in expected alignment. No periprosthetic lucency or fracture. There has been patellar resurfacing. Recent postsurgical change includes air and edema in the soft tissues and joint space. IMPRESSION: Left knee arthroplasty without immediate postoperative complication. Electronically Signed   By: Narda Rutherford M.D.   On: 10/15/2023 15:55    Disposition: Discharge disposition: 01-Home or Self Care          Follow-up Information     Cristie Hem, PA-C. Schedule an appointment as soon as possible for a visit in 2 week(s).   Specialty: Orthopedic Surgery Contact information: [address] [place] [address] [phone]         [facility] Follow up.   Why: The home health agency will contact you for the first home  visit. Contact information: [address] [place] [address] [phone]                  Signed: Cristie Hem 10/16/2023, 9:44 AM

## 2023-10-16 NOTE — Progress Notes (Signed)
Patient alert and oriented, voiding adequately, skin clean, dry and intact without evidence of skin break down, or symptoms of complications - no redness or edema noted, only slight tenderness at site.  Patient states pain is manageable at time of discharge. Patient has an appointment with MD in 2 weeks 

## 2023-10-16 NOTE — Anesthesia Postprocedure Evaluation (Signed)
Anesthesia Post Note  Patient: Lauren [name]  Procedure(s) Performed: REVISION LEFT PARTIAL KNEE to TOTAL KNEE ARTHROPLASTY (Left: Knee)     Patient location during evaluation: PACU Anesthesia Type: Spinal and MAC Level of consciousness: awake and alert Pain management: pain level controlled Vital Signs Assessment: post-procedure vital signs reviewed and stable Respiratory status: spontaneous breathing and respiratory function stable Cardiovascular status: blood pressure returned to baseline and stable Postop Assessment: spinal receding Anesthetic complications: no   No notable events documented.  Last Vitals:  Vitals:   10/16/23 0420 10/16/23 0752  BP: 128/80 124/84  Pulse: 72 69  Resp:  16  Temp: 36.9 C 36.9 C  SpO2: 100% 100%    Last Pain:  Vitals:   10/16/23 1110  TempSrc:   PainSc: 4                  Kennieth Rad

## 2023-10-16 NOTE — Evaluation (Signed)
Occupational Therapy Evaluation Patient Details Name: Lauren [name] MRN: [id number] DOB: Oct 28, 1961 Today's Date: 10/16/2023   History of Present Illness 62 yo presented to [facility] on  10/15/23 for conversion of lt partial knee replacement to TKR. PMH - partial lt knee replacement shoulder surg gastric bypass   Clinical Impression   Patient evaluated by Occupational Therapy with no further acute OT needs identified. All education has been completed and the patient has no further questions. See below for any follow-up Occupational Therapy or equipment needs. OT to sign off. Thank you for referral.         If plan is discharge home, recommend the following: Assist for transportation    Functional Status Assessment  Patient has had a recent decline in their functional status and demonstrates the ability to make significant improvements in function in a reasonable and predictable amount of time.  Equipment Recommendations  None recommended by OT (DME delivered home RW and CPM machine per spouse)    Recommendations for Other Services       Precautions / Restrictions Precautions Precautions: Knee Restrictions Weight Bearing Restrictions: Yes LLE Weight Bearing: Weight bearing as tolerated      Mobility Bed Mobility Overal bed mobility: Independent                  Transfers Overall transfer level: Modified independent Equipment used: Rolling walker (2 wheels)                      Balance Overall balance assessment: No apparent balance deficits (not formally assessed)                                         ADL either performed or assessed with clinical judgement   ADL Overall ADL's : Independent                                             Vision Baseline Vision/History: 0 No visual deficits Patient Visual Report: No change from baseline       Perception         Praxis         Pertinent Vitals/Pain Pain  Assessment Pain Assessment: No/denies pain     Extremity/Trunk Assessment Upper Extremity Assessment Upper Extremity Assessment: Overall WFL for tasks assessed   Lower Extremity Assessment Lower Extremity Assessment: Defer to PT evaluation   Cervical / Trunk Assessment Cervical / Trunk Assessment: Normal   Communication Communication Communication: No apparent difficulties   Cognition Arousal: Alert Behavior During Therapy: WFL for tasks assessed/performed Overall Cognitive Status: Within Functional Limits for tasks assessed                                       General Comments  incision covered and dry at this time    Exercises     Shoulder Instructions      Home Living Family/patient expects to be discharged to:: Private residence Living Arrangements: Spouse/significant other Available Help at Discharge: Family;Available 24 hours/day Type of Home: House Home Access: Stairs to enter [employer] of Steps: 4 Entrance Stairs-Rails: Left Home Layout: Two level;Bed/bath upstairs;1/2 bath on main level Alternate Level Stairs-Number of  Steps: flight Alternate Level Stairs-Rails: Right Bathroom Shower/Tub: Tub/shower unit   Bathroom Toilet: Standard     Home Equipment: Agricultural consultant (2 wheels);Toilet riser;Other (comment);Adaptive equipment (biget toilet) Adaptive Equipment: Reacher Additional Comments: spouse there for a week to help, has a fish tank      Prior Functioning/Environment Prior Level of Function : Independent/Modified Independent;Driving             Mobility Comments: no assistive device          OT Problem List:        OT Treatment/Interventions:      OT Goals(Current goals can be found in the care plan section) Acute Rehab OT Goals Patient Stated Goal: to watch Lost series while in CPM Potential to Achieve Goals: Good  OT Frequency:      Co-evaluation              AM-PAC OT "6 Clicks" Daily  Activity     Outcome Measure Help from another person eating meals?: None Help from another person taking care of personal grooming?: None Help from another person toileting, which includes using toliet, bedpan, or urinal?: None Help from another person bathing (including washing, rinsing, drying)?: None Help from another person to put on and taking off regular upper body clothing?: None Help from another person to put on and taking off regular lower body clothing?: None 6 Click Score: 24   End of Session Equipment Utilized During Treatment: Rolling walker (2 wheels) Nurse Communication: Mobility status;Precautions  Activity Tolerance: Patient tolerated treatment well Patient left: in bed;with call bell/phone within reach;with family/visitor present  OT Visit Diagnosis: Unsteadiness on feet (R26.81)                Time: 1610-9604 OT Time Calculation (min): 21 min Charges:  OT General Charges $OT Visit: 1 Visit OT Evaluation $OT Eval Moderate Complexity: 1 Mod   Brynn, OTR/L  Acute Rehabilitation Services Office: [phone] .   Mateo Flow 10/16/2023, 9:12 AM

## 2023-10-16 NOTE — Progress Notes (Signed)
Physical Therapy Treatment Patient Details Name: Lauren [name] MRN: [id number] DOB: [birth year] Today's Date: 10/16/2023   History of Present Illness 62 yo presented to [name] on  10/15/23 for conversion of lt partial knee replacement to TKR. PMH - partial lt knee replacement shoulder surg gastric bypass    PT Comments  Pt greeted seated up EOB on arrival and agreeable to session with good progress towards acute goals. Pt demonstrating transfers at mod I level and requiring supervision for gait with RW support for increased distance. Pt able to ascend/descend 10 steps with CGA for safety and cues for optimal hand and foot placement. Pt was educated on continued walker use to maximize functional independence, safety, and decrease risk for falls as well as HEP and compliance, appropriate activity progression, safe car entry/exit and importance of knee extension with pt verbalizing/demonstrating understanding. Anticipate safe discharge, with assist level outlined below, once medically cleared, will continue to follow acutely.     If plan is discharge home, recommend the following: Help with stairs or ramp for entrance;Assist for transportation;Assistance with cooking/housework   Can travel by private vehicle        Equipment Recommendations  None recommended by PT    Recommendations for Other Services       Precautions / Restrictions Precautions Precautions: Knee Restrictions Weight Bearing Restrictions: Yes LLE Weight Bearing: Weight bearing as tolerated     Mobility  Bed Mobility Overal bed mobility: Independent             General bed mobility comments: pt seated up EOB on arrival    Transfers Overall transfer level: Modified independent Equipment used: Rolling walker (2 wheels) Transfers: Sit to/from Stand Sit to Stand: Modified independent (Device/Increase time)           General transfer comment: good hand placement, no assist or cues needed     Ambulation/Gait Ambulation/Gait assistance: Supervision Gait Distance (Feet): 355 Feet Assistive device: Rolling walker (2 wheels) Gait Pattern/deviations: Step-through pattern, Decreased stride length Gait velocity: slowed     General Gait Details: supervision for safety   Stairs Stairs: Yes Stairs assistance: Contact guard assist Stair Management: One rail Left, Step to pattern, Sideways Number of Stairs: 10 General stair comments: up/down with two hands on L rail to simulate home set-up, cues for optimal hand and foot placement, no LOB   Wheelchair Mobility     Tilt Bed    Modified Rankin (Stroke Patients Only)       Balance Overall balance assessment: No apparent balance deficits (not formally assessed)                                          Cognition Arousal: Alert Behavior During Therapy: WFL for tasks assessed/performed Overall Cognitive Status: Within Functional Limits for tasks assessed                                          Exercises Total Joint Exercises Quad Sets: AROM, Left, 10 reps (with ankle elevated on yellow foam) Straight Leg Raises: AROM, Left, 5 reps Knee Flexion: AROM, Left, 5 reps Goniometric ROM: 0-90    General Comments General comments (skin integrity, edema, etc.): incision covered and dry at this time      Pertinent Vitals/Pain Pain Assessment Pain Assessment: Faces  Faces Pain Scale: Hurts a little bit Pain Location: lt knee with knee flexion Pain Descriptors / Indicators: Grimacing Pain Intervention(s): Monitored during session, Limited activity within patient's tolerance    Home Living Family/patient expects to be discharged to:: Private residence Living Arrangements: Spouse/significant other Available Help at Discharge: Family;Available 24 hours/day Type of Home: House Home Access: Stairs to enter Entrance Stairs-Rails: Left Entrance Stairs-Number of Steps: 4 Alternate Level  Stairs-Number of Steps: flight Home Layout: Two level;Bed/bath upstairs;1/2 bath on main level Home Equipment: Rolling Walker (2 wheels);Toilet riser;Other (comment);Adaptive equipment (biget toilet) Additional Comments: spouse there for a week to help, has a fish tank    Prior Function            PT Goals (current goals can now be found in the care plan section) Acute Rehab PT Goals PT Goal Formulation: With patient Time For Goal Achievement: 10/22/23 Progress towards PT goals: Progressing toward goals    Frequency    7X/week      PT Plan      Co-evaluation              AM-PAC PT "6 Clicks" Mobility   Outcome Measure  Help needed turning from your back to your side while in a flat bed without using bedrails?: None Help needed moving from lying on your back to sitting on the side of a flat bed without using bedrails?: None Help needed moving to and from a bed to a chair (including a wheelchair)?: A Little Help needed standing up from a chair using your arms (e.g., wheelchair or bedside chair)?: A Little Help needed to walk in hospital room?: A Little Help needed climbing 3-5 steps with a railing? : A Little 6 Click Score: 20    End of Session Equipment Utilized During Treatment: Gait belt Activity Tolerance: Patient tolerated treatment well Patient left: in chair;with call bell/phone within reach Nurse Communication: Mobility status;Other (comment) (pt without need for PM session) PT Visit Diagnosis: Other abnormalities of gait and mobility (R26.89);Pain Pain - Right/Left: Left Pain - part of body: Knee     Time: 1610-9604 PT Time Calculation (min) (ACUTE ONLY): 19 min  Charges:    $Gait Training: 8-22 mins PT General Charges $$ ACUTE PT VISIT: 1 Visit                     [name] R. PTA Acute Rehabilitation Services Office: [phone]   Catalina Antigua 10/16/2023, 9:49 AM

## 2023-10-16 NOTE — Progress Notes (Signed)
Subjective: 1 Day Post-Op Procedure(s) (LRB): REVISION LEFT PARTIAL KNEE to TOTAL KNEE ARTHROPLASTY (Left) Patient reports pain as mild.    Objective: Vital signs in last 24 hours: Temp:  [97.7 F (36.5 C)-98.7 F (37.1 C)] 98.4 F (36.9 C) (11/05 0752) Pulse Rate:  [53-74] 69 (11/05 0752) Resp:  [10-22] 16 (11/05 0752) BP: (110-140)/(64-96) 124/84 (11/05 0752) SpO2:  [93 %-100 %] 100 % (11/05 0752)  Intake/Output from previous day: 11/04 0701 - 11/05 0700 In: 2160 [P.O.:960; I.V.:950; IV Piggyback:250] Out: 2225 [Urine:2150; Blood:75] Intake/Output this shift: No intake/output data recorded.  No results for input(s): "HGB" in the last 72 hours. No results for input(s): "WBC", "RBC", "HCT", "PLT" in the last 72 hours. No results for input(s): "NA", "K", "CL", "CO2", "BUN", "CREATININE", "GLUCOSE", "CALCIUM" in the last 72 hours. No results for input(s): "LABPT", "INR" in the last 72 hours.  Neurologically intact Neurovascular intact Sensation intact distally Intact pulses distally Dorsiflexion/Plantar flexion intact Incision: dressing C/D/I No cellulitis present Compartment soft   Assessment/Plan: 1 Day Post-Op Procedure(s) (LRB): REVISION LEFT PARTIAL KNEE to TOTAL KNEE ARTHROPLASTY (Left) Advance diet Up with therapy D/C IV fluids Discharge home with home health WBAT LLE     Cristie Hem 10/16/2023, 9:43 AM

## 2023-10-16 NOTE — Telephone Encounter (Signed)
Pt called & states she just getting out of the hospital,wanted to see about Pt Assistance for Ozempic, she has commercial [employer] ins thru husband but states Ozempic cost is like $100 a pen & she can't afford.  States she gets PAP for her Chauncey Mann

## 2023-10-17 ENCOUNTER — Telehealth: Payer: Self-pay | Admitting: Orthopaedic Surgery

## 2023-10-17 ENCOUNTER — Encounter (HOSPITAL_COMMUNITY): Payer: Self-pay | Admitting: Orthopaedic Surgery

## 2023-10-17 NOTE — Telephone Encounter (Signed)
Called and gave verbal. Also called patient. Explained that she just needed the one script of Doxy for 20 tablets.

## 2023-10-17 NOTE — Telephone Encounter (Signed)
Almira (PT) from [facility] called requesting verbal orders for 2wk 1, 3wk 1, and 1wk 1. Also states pt received two bottles of doxycycline same strength and pt need call back about this. Almira secure number is [phone]. Pt phone number is [phone].

## 2023-10-18 ENCOUNTER — Encounter: Payer: Self-pay | Admitting: Orthopaedic Surgery

## 2023-10-22 ENCOUNTER — Encounter: Payer: Self-pay | Admitting: Orthopaedic Surgery

## 2023-10-22 ENCOUNTER — Other Ambulatory Visit: Payer: Self-pay | Admitting: Physician Assistant

## 2023-10-22 MED ORDER — FLUCONAZOLE 150 MG PO TABS: 150.0000 mg | ORAL_TABLET | Freq: Once | ORAL | 0 refills | Status: AC

## 2023-10-22 NOTE — Telephone Encounter (Signed)
[facility].

## 2023-10-25 NOTE — Telephone Encounter (Signed)
Do you have any suggestions or recommendations

## 2023-10-26 NOTE — Telephone Encounter (Signed)
Obtained a copay card for the Ozempic through [employer] that will make the medication $25/month. Cannot be used at mail order pharmacy so patient will need to pick up locally. Sending in a new rx to [facility] on [address] at patient request.  Copay Card Info for Reference: BIN: [id number] PCN: [id number] Group: [id number] ID: [id number]  Attached info to rx sent to pharmacy as well.  Sherrill Raring, PharmD Clinical Pharmacist [phone]

## 2023-10-30 ENCOUNTER — Encounter: Payer: Self-pay | Admitting: Physician Assistant

## 2023-10-30 ENCOUNTER — Ambulatory Visit (INDEPENDENT_AMBULATORY_CARE_PROVIDER_SITE_OTHER): Payer: Managed Care, Other (non HMO) | Admitting: Physician Assistant

## 2023-10-30 DIAGNOSIS — Z96652 Presence of left artificial knee joint: Secondary | ICD-10-CM

## 2023-10-30 MED ORDER — HYDROCODONE-ACETAMINOPHEN 5-325 MG PO TABS: 1.0000 | ORAL_TABLET | Freq: Three times a day (TID) | ORAL | 0 refills | Status: DC | PRN

## 2023-10-30 MED ORDER — METHOCARBAMOL 750 MG PO TABS: 750.0000 mg | ORAL_TABLET | Freq: Two times a day (BID) | ORAL | 2 refills | Status: DC | PRN

## 2023-10-30 MED ORDER — PREDNISONE 5 MG (21) PO TBPK: ORAL_TABLET | ORAL | 0 refills | Status: DC

## 2023-10-30 NOTE — Progress Notes (Signed)
Post-Op Visit Note   Patient: Lauren [name]           Date of Birth: November 12, 1961           MRN: [id number] Visit Date: 10/30/2023 PCP: Jac Canavan, PA-C   Assessment & Plan:  Chief Complaint:  Chief Complaint  Patient presents with   Left Knee - Follow-up    Left total knee arthroplasty 10/15/2023   Visit Diagnoses:  1. H/O total knee replacement, left     Plan: Patient is a pleasant 62 year old female who comes in today 2-week status post left total knee replacement 10/15/2023.  She has been doing relatively well but has had pain and paresthesias down the entire left leg.  She has been taking Norco and Robaxin.  She has been compliant taking Xarelto.  She has been getting a home health physical therapy and is ambulating with a walker.  Examination of her left knee a well-healing surgical incision with nylon sutures in place.  No evidence of infection or cellulitis.  Calves are soft nontender.  She is neurovascular intact distally.  Today, sutures were removed and Steri-Strips applied.  I sent in a referral for outpatient physical therapy.  Office refilled Norco and Robaxin.  I have also sent in a Sterapred taper to take as I feel she has a component of sciatica going on.  She will continue with the Xarelto until she is 4 weeks postop and then will transition to a baby aspirin twice daily for DVT prophylaxis.  Follow-up in 4 weeks for repeat evaluation and 2 view x-rays of the left knee.  Call with concerns or questions.  Follow-Up Instructions: Return in about 4 weeks (around 11/27/2023).   Orders:  Orders Placed This Encounter  Procedures   Ambulatory referral to Physical Therapy   Meds ordered this encounter  Medications   HYDROcodone-acetaminophen (NORCO) 5-325 MG tablet    Sig: Take 1-2 tablets by mouth 3 (three) times daily as needed.    Dispense:  30 tablet    Refill:  0   methocarbamol (ROBAXIN-750) 750 MG tablet    Sig: Take 1 tablet (750 mg total) by mouth 2 (two)  times daily as needed.    Dispense:  20 tablet    Refill:  2   predniSONE (STERAPRED UNI-PAK 21 TAB) 5 MG (21) TBPK tablet    Sig: Take as directed    Dispense:  20 tablet    Refill:  0    Imaging: No new imaging  PMFS History: Patient Active Problem List   Diagnosis Date Noted   Primary osteoarthritis of left knee 10/15/2023   Status post total left knee replacement 10/15/2023   Type 2 diabetes mellitus with hyperglycemia, without long-term current use of insulin (HCC) 02/20/2023   Aortic atherosclerosis (HCC) 02/20/2023   Primary biliary cholangitis (HCC) 10/06/2022   Kidney stone 06/30/2022   Encounter for health maintenance examination in adult 06/30/2022   History of gastric bypass 06/30/2022   Anemia 06/30/2022   Altered mental status 06/30/2022   Nausea and vomiting 06/30/2022   Other constipation 02/14/2022   Liver hematoma 02/05/2022   Elevated LFTs 01/12/2022   Allergic rhinitis due to pollen 09/12/2021   Pain in joints of both feet 09/12/2021   OSA (obstructive sleep apnea) 09/12/2021   Stage 3b chronic kidney disease (HCC) 07/18/2021   Elevated alkaline phosphatase level 07/18/2021   Elevated liver enzymes 07/18/2021   Chronic neck pain 07/13/2021   Chronic back pain 07/13/2021  Urge incontinence of urine 07/13/2021   Incontinence of feces 07/13/2021   Eczema 07/13/2021   Paresthesia of arm 07/13/2021   Status post left partial knee replacement 07/13/2021   History of thyroid cancer 07/13/2021   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   Hyperlipidemia 07/13/2021   Essential hypertension, benign 07/13/2021   Elevated uric acid in blood 07/13/2021   Insomnia 07/13/2021   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   Past Medical History:  Diagnosis Date   Anemia    CKD stage 3b, GFR 30-44 ml/min (HCC)    Complication of anesthesia    Diabetes mellitus, type 2 (HCC)    Eczema    GERD (gastroesophageal reflux disease)    Glaucoma    Gout    History of  kidney stones    Hyperlipidemia    Hypertension    Metabolic dysfunction-associated steatohepatitis (MASH)    PONV (postoperative nausea and vomiting)    Primary biliary cholangitis (HCC)    Sarcoidosis    Sleep apnea    Thyroid cancer (HCC)     Family History  Problem Relation Age of Onset   Breast cancer Mother    Breast cancer Maternal Grandmother    Diabetes Mellitus II Paternal Grandfather    Throat cancer Paternal Grandfather    Stomach cancer Neg Hx    Esophageal cancer Neg Hx    Pancreatic cancer Neg Hx    Colon cancer Neg Hx     Past Surgical History:  Procedure Laterality Date   CATARACT EXTRACTION Bilateral    COLONOSCOPY     GASTRIC BYPASS     KNEE SURGERY Left 2017   LIVER BIOPSY  01/2022   Novosure     TOTAL KNEE REVISION Left 10/15/2023   Procedure: REVISION LEFT PARTIAL KNEE to TOTAL KNEE ARTHROPLASTY;  Surgeon: Tarry Kos, MD;  Location: [facility] OR;  Service: Orthopedics;  Laterality: Left;   TUBAL LIGATION     Social History   Occupational History   Not on file  Tobacco Use   Smoking status: Former    Current packs/day: 0.00    Average packs/day: 1.3 packs/day for 22.0 years (27.5 ttl pk-yrs)    Types: E-cigarettes, Cigarettes    Start date: 60    Quit date: 2002    Years since quitting: 22.8   Smokeless tobacco: Never   Tobacco comments:    Stopped smoking 24yrs ago  Vaping Use   Vaping status: Never Used  Substance and Sexual Activity   Alcohol use: Yes    Comment: Occ.   Drug use: Not Currently    Comment: gummies   Sexual activity: Yes

## 2023-10-31 MED ORDER — SEMAGLUTIDE (2 MG/DOSE) 8 MG/3ML ~~LOC~~ SOPN: 2.0000 mg | PEN_INJECTOR | SUBCUTANEOUS | 2 refills | Status: AC

## 2023-11-09 ENCOUNTER — Encounter: Payer: Self-pay | Admitting: Orthopaedic Surgery

## 2023-11-12 ENCOUNTER — Other Ambulatory Visit: Payer: Self-pay | Admitting: Physician Assistant

## 2023-11-12 ENCOUNTER — Other Ambulatory Visit: Payer: Self-pay

## 2023-11-12 ENCOUNTER — Ambulatory Visit (HOSPITAL_BASED_OUTPATIENT_CLINIC_OR_DEPARTMENT_OTHER): Payer: Managed Care, Other (non HMO) | Attending: Physician Assistant | Admitting: Physical Therapy

## 2023-11-12 ENCOUNTER — Encounter (HOSPITAL_BASED_OUTPATIENT_CLINIC_OR_DEPARTMENT_OTHER): Payer: Self-pay | Admitting: Physical Therapy

## 2023-11-12 DIAGNOSIS — R262 Difficulty in walking, not elsewhere classified: Secondary | ICD-10-CM

## 2023-11-12 DIAGNOSIS — M25562 Pain in left knee: Secondary | ICD-10-CM | POA: Diagnosis present

## 2023-11-12 DIAGNOSIS — Z96652 Presence of left artificial knee joint: Secondary | ICD-10-CM | POA: Diagnosis not present

## 2023-11-12 DIAGNOSIS — M25662 Stiffness of left knee, not elsewhere classified: Secondary | ICD-10-CM

## 2023-11-12 NOTE — Telephone Encounter (Signed)
The only one I see sent in was from 11/1.  Once she finishes xarelto (4 weeks po), take a baby asa twice daily x 2 weeks

## 2023-11-12 NOTE — Therapy (Signed)
OUTPATIENT PHYSICAL THERAPY EVALUATION   Patient Name: Lauren [name] MRN: [id number] DOB:1961-11-15, 62 y.o., female Today's Date: 11/12/2023  END OF SESSION:  PT End of Session - 11/12/23 1532     Visit Number 1    Number of Visits 25    Date for PT Re-Evaluation 02/09/24    Authorization Type [employer]    PT Start Time 1530    PT Stop Time 1610    PT Time Calculation (min) 40 min    Activity Tolerance Patient tolerated treatment well    Behavior During Therapy WFL for tasks assessed/performed             Past Medical History:  Diagnosis Date   Anemia    CKD stage 3b, GFR 30-44 ml/min (HCC)    Complication of anesthesia    Diabetes mellitus, type 2 (HCC)    Eczema    GERD (gastroesophageal reflux disease)    Glaucoma    Gout    History of kidney stones    Hyperlipidemia    Hypertension    Metabolic dysfunction-associated steatohepatitis (MASH)    PONV (postoperative nausea and vomiting)    Primary biliary cholangitis (HCC)    Sarcoidosis    Sleep apnea    Thyroid cancer (HCC)    Past Surgical History:  Procedure Laterality Date   CATARACT EXTRACTION Bilateral    COLONOSCOPY     GASTRIC BYPASS     KNEE SURGERY Left 2017   LIVER BIOPSY  01/2022   Novosure     TOTAL KNEE REVISION Left 10/15/2023   Procedure: REVISION LEFT PARTIAL KNEE to TOTAL KNEE ARTHROPLASTY;  Surgeon: Tarry Kos, MD;  Location: [facility] OR;  Service: Orthopedics;  Laterality: Left;   TUBAL LIGATION     Patient Active Problem List   Diagnosis Date Noted   Primary osteoarthritis of left knee 10/15/2023   Status post total left knee replacement 10/15/2023   Type 2 diabetes mellitus with hyperglycemia, without long-term current use of insulin (HCC) 02/20/2023   Aortic atherosclerosis (HCC) 02/20/2023   Primary biliary cholangitis (HCC) 10/06/2022   Kidney stone 06/30/2022   Encounter for health maintenance examination in adult 06/30/2022   History of gastric bypass 06/30/2022   Anemia 06/30/2022    Altered mental status 06/30/2022   Nausea and vomiting 06/30/2022   Other constipation 02/14/2022   Liver hematoma 02/05/2022   Elevated LFTs 01/12/2022   Allergic rhinitis due to pollen 09/12/2021   Pain in joints of both feet 09/12/2021   OSA (obstructive sleep apnea) 09/12/2021   Stage 3b chronic kidney disease (HCC) 07/18/2021   Elevated alkaline phosphatase level 07/18/2021   Elevated liver enzymes 07/18/2021   Chronic neck pain 07/13/2021   Chronic back pain 07/13/2021   Urge incontinence of urine 07/13/2021   Incontinence of feces 07/13/2021   Eczema 07/13/2021   Paresthesia of arm 07/13/2021   Status post left partial knee replacement 07/13/2021   History of thyroid cancer 07/13/2021   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   Hyperlipidemia 07/13/2021   Essential hypertension, benign 07/13/2021   Elevated uric acid in blood 07/13/2021   Insomnia 07/13/2021   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     REFERRING PROVIDER: Gershon Mussel, MD  REFERRING DIAG:  Z96.652 (ICD-10-CM) - H/O total knee replacement, left      Rationale for Evaluation and Treatment: Rehabilitation  THERAPY DIAG:  Acute pain of left knee  Stiffness of left knee, not elsewhere classified  Difficulty in walking, not elsewhere classified  ONSET DATE: DOS 10/15/23   SUBJECTIVE:                                                                                                                                                                                           SUBJECTIVE STATEMENT: Parasthesias lateral to anterior shin just distal to knee.   PERTINENT HISTORY:  Partial TKA 2018  PAIN:  Are you having pain? Yes: NPRS scale: 6/10 Pain location: posterior and lateral thigh Pain description: sciatica, like I am sitting on the cross bar of the RW Aggravating factors: sitting Relieving factors: rest  PRECAUTIONS:  None  RED FLAGS: None   WEIGHT BEARING RESTRICTIONS:  No  FALLS:   Has patient fallen in last 6 months? No  LIVING ENVIRONMENT: 14 steps at home  OCCUPATION:  Not working  PLOF:  Independent  PATIENT GOALS:  Stairs, walking, getting ready to move in June, play with grand children.    OBJECTIVE:  Note: Objective measures were completed at Evaluation unless otherwise noted.  PATIENT SURVEYS:  FOTO 41  COGNITIVE STATUS: Within functional limits for tasks assessed   EDEMA:  Yes: Lt knee  GAIT: Eval: with RW, lacking full knee ext at heel strike   Body Part #1 Knee  PALPATION: Spongy end feel  LOWER EXTREMITY ROM:     Passive  Left eval  Hip flexion   Hip extension   Hip abduction   Hip adduction   Hip internal rotation   Hip external rotation   Knee flexion 100  Knee extension 0   (Blank rows = not tested)       TREATMENT:                                                                                                                               DATE: eval 11/12/23  Supine quad set, + SLR Knee flexion Self massage with tennis ball Supine piriformis stretch Seated hamstring stretch   PATIENT EDUCATION:  Education details: Anatomy of condition, POC, HEP, exercise form/rationale Person educated: Spouse Education method: Explanation, Demonstration, Actor cues, Verbal  cues, and Handouts Education comprehension: verbalized understanding, returned demonstration, verbal cues required, tactile cues required, and needs further education  HOME EXERCISE PROGRAM:  [email or web address]  Access Code: [email or web address]   ASSESSMENT:  CLINICAL IMPRESSION: Patient is a 62 y.o. F who was seen today for physical therapy evaluation and treatment for s/p Lt TKA. Has had 8 visits of home health PT/OT. Taking gout medication right now that is helping to decrease edema around knee. Able to obtain full knee ext but lacking at heel strike in gait. Will benefit from skilled PT to address deficits and meet goals.     REHAB POTENTIAL:  Good  CLINICAL DECISION MAKING: Stable/uncomplicated  EVALUATION COMPLEXITY: Low   GOALS: Goals reviewed with patient? Yes  SHORT TERM GOALS: Target date: 12/01/23  ROM 0-120 Baseline: Goal status: INITIAL  2.  Full knee extension at heel strike in gait Baseline:  Goal status: INITIAL  3.  Able to ambulate a few steps without antalgic pattern, without AD Baseline:  Goal status: INITIAL    LONG TERM GOALS: Target date: POC date   Meet FOTO goal Baseline:  Goal status: INITIAL  2.  Perform stairs step over step, ascending and descending Baseline:  Goal status: INITIAL  3.  Average pain <=2/10 Baseline:  Goal status: INITIAL  4.  Return to gym program at [facility] Baseline:  Goal status: INITIAL    PLAN:  PT FREQUENCY: 1-2x/week  PT DURATION: 12 weeks  PLANNED INTERVENTIONS: 97164- PT Re-evaluation, 97110-Therapeutic exercises, 97530- Therapeutic activity, 97112- Neuromuscular re-education, 97535- Self Care, 62130- Manual therapy, [phone]- Gait training, [phone]- Aquatic Therapy, [phone]- Vasopneumatic device, Patient/Family education, Balance training, Stair training, Taping, Dry Needling, Joint mobilization, Spinal mobilization, Scar mobilization, Cryotherapy, and Moist heat.  PLAN FOR NEXT SESSION: ROM, quad activation  Mory Herrman C. Latasia Silberstein PT, DPT 11/12/23 4:18 PM

## 2023-11-15 NOTE — Telephone Encounter (Signed)
did the sterapred taper help at all? if so, we can send in another rx. i would also recommend robaxin if she is not already taking. she can take 4 times/day if needed.

## 2023-11-16 ENCOUNTER — Other Ambulatory Visit: Payer: Self-pay | Admitting: Physician Assistant

## 2023-11-16 MED ORDER — PREDNISONE 10 MG (21) PO TBPK: ORAL_TABLET | ORAL | 0 refills | Status: DC

## 2023-11-16 NOTE — Telephone Encounter (Signed)
I spoke to dr Roda Shutters.  He recommended trying another steroid pack and or coming in for an IM toradol injection.  I went ahead and sent in a stronger steroid

## 2023-11-20 LAB — LAB REPORT - SCANNED
Albumin, Urine POC: 566
Albumin/Creatinine Ratio, Urine, POC: 359
Creatinine, POC: 157.6 mg/dL
EGFR: 33

## 2023-11-24 ENCOUNTER — Encounter (HOSPITAL_BASED_OUTPATIENT_CLINIC_OR_DEPARTMENT_OTHER): Payer: Self-pay | Admitting: Physical Therapy

## 2023-11-24 ENCOUNTER — Ambulatory Visit (HOSPITAL_BASED_OUTPATIENT_CLINIC_OR_DEPARTMENT_OTHER): Payer: Managed Care, Other (non HMO) | Admitting: Physical Therapy

## 2023-11-24 DIAGNOSIS — R262 Difficulty in walking, not elsewhere classified: Secondary | ICD-10-CM

## 2023-11-24 DIAGNOSIS — M25662 Stiffness of left knee, not elsewhere classified: Secondary | ICD-10-CM

## 2023-11-24 DIAGNOSIS — M25562 Pain in left knee: Secondary | ICD-10-CM

## 2023-11-24 NOTE — Therapy (Signed)
OUTPATIENT PHYSICAL THERAPY TREATMENT   Patient Name: Lauren [name] MRN: [id number] DOB:25-Mar-1961, 62 y.o., female Today's Date: 11/24/2023  END OF SESSION:  PT End of Session - 11/24/23 1318     Visit Number 2    Number of Visits 25    Date for PT Re-Evaluation 02/09/24    Authorization Type [employer]    PT Start Time 1130    PT Stop Time 1208    PT Time Calculation (min) 38 min    Activity Tolerance Patient tolerated treatment well    Behavior During Therapy WFL for tasks assessed/performed             Past Medical History:  Diagnosis Date   Anemia    CKD stage 3b, GFR 30-44 ml/min (HCC)    Complication of anesthesia    Diabetes mellitus, type 2 (HCC)    Eczema    GERD (gastroesophageal reflux disease)    Glaucoma    Gout    History of kidney stones    Hyperlipidemia    Hypertension    Metabolic dysfunction-associated steatohepatitis (MASH)    PONV (postoperative nausea and vomiting)    Primary biliary cholangitis (HCC)    Sarcoidosis    Sleep apnea    Thyroid cancer (HCC)    Past Surgical History:  Procedure Laterality Date   CATARACT EXTRACTION Bilateral    COLONOSCOPY     GASTRIC BYPASS     KNEE SURGERY Left 2017   LIVER BIOPSY  01/2022   Novosure     TOTAL KNEE REVISION Left 10/15/2023   Procedure: REVISION LEFT PARTIAL KNEE to TOTAL KNEE ARTHROPLASTY;  Surgeon: Tarry Kos, MD;  Location: [facility] OR;  Service: Orthopedics;  Laterality: Left;   TUBAL LIGATION     Patient Active Problem List   Diagnosis Date Noted   Primary osteoarthritis of left knee 10/15/2023   Status post total left knee replacement 10/15/2023   Type 2 diabetes mellitus with hyperglycemia, without long-term current use of insulin (HCC) 02/20/2023   Aortic atherosclerosis (HCC) 02/20/2023   Primary biliary cholangitis (HCC) 10/06/2022   Kidney stone 06/30/2022   Encounter for health maintenance examination in adult 06/30/2022   History of gastric bypass 06/30/2022   Anemia 06/30/2022    Altered mental status 06/30/2022   Nausea and vomiting 06/30/2022   Other constipation 02/14/2022   Liver hematoma 02/05/2022   Elevated LFTs 01/12/2022   Allergic rhinitis due to pollen 09/12/2021   Pain in joints of both feet 09/12/2021   OSA (obstructive sleep apnea) 09/12/2021   Stage 3b chronic kidney disease (HCC) 07/18/2021   Elevated alkaline phosphatase level 07/18/2021   Elevated liver enzymes 07/18/2021   Chronic neck pain 07/13/2021   Chronic back pain 07/13/2021   Urge incontinence of urine 07/13/2021   Incontinence of feces 07/13/2021   Eczema 07/13/2021   Paresthesia of arm 07/13/2021   Status post left partial knee replacement 07/13/2021   History of thyroid cancer 07/13/2021   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   Hyperlipidemia 07/13/2021   Essential hypertension, benign 07/13/2021   Elevated uric acid in blood 07/13/2021   Insomnia 07/13/2021   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     REFERRING PROVIDER: Gershon Mussel, MD  REFERRING DIAG:  Z96.652 (ICD-10-CM) - H/O total knee replacement, left      Rationale for Evaluation and Treatment: Rehabilitation  THERAPY DIAG:  Acute pain of left knee  Stiffness of left knee, not elsewhere classified  Difficulty in walking, not elsewhere classified  ONSET DATE: DOS 10/15/23   SUBJECTIVE:                                                                                                                                                                                           SUBJECTIVE STATEMENT: Still feeling some numbness.  It is stiff. PERTINENT HISTORY:  Partial TKA 2018  PAIN:  Are you having pain? Yes: NPRS scale: Minimal/10 Pain location: posterior and lateral thigh Pain description: Stiffness Aggravating factors: sitting Relieving factors: rest  PRECAUTIONS:  None  RED FLAGS: None   WEIGHT BEARING RESTRICTIONS:  No  FALLS:  Has patient fallen in last 6 months? No  LIVING  ENVIRONMENT: 14 steps at home  OCCUPATION:  Not working  PLOF:  Independent  PATIENT GOALS:  Stairs, walking, getting ready to move in June, play with grand children.    OBJECTIVE:  Note: Objective measures were completed at Evaluation unless otherwise noted.  PATIENT SURVEYS:  FOTO 41  COGNITIVE STATUS: Within functional limits for tasks assessed   EDEMA:  Yes: Lt knee  GAIT: Eval: with RW, lacking full knee ext at heel strike   Body Part #1 Knee  PALPATION: Spongy end feel  LOWER EXTREMITY ROM:     Passive  Left eval Lt  12/14  Hip flexion    Hip extension    Hip abduction    Hip adduction    Hip internal rotation    Hip external rotation    Knee flexion 100 114  Knee extension 0    (Blank rows = not tested)       TREATMENT:                                                                                                                               Treatment                            12/14:  NuStep level 3 for 4 minutes, level 5 for 1 minute Step and return over hurdle Gait training-cues for  pressure over second digit and knee flexion in swing through Heel raises Gastroc stretch Single-leg stance with glutes set Straight leg raise without touching the table in between reps, straight leg raise circles Supine bridges at endrange knee flexion Side-lying arcs Stair training-glutes set to ascend, controlled eccentric quad to descend   DATE: eval 11/12/23  Supine quad set, + SLR Knee flexion Self massage with tennis ball Supine piriformis stretch Seated hamstring stretch   PATIENT EDUCATION:  Education details: Anatomy of condition, POC, HEP, exercise form/rationale Person educated: Spouse Education method: Explanation, Demonstration, Actor cues, Verbal cues, and Handouts Education comprehension: verbalized understanding, returned demonstration, verbal cues required, tactile cues required, and needs further education  HOME EXERCISE  PROGRAM:  [email or web address]  Access Code: [email or web address]   ASSESSMENT:  CLINICAL IMPRESSION: 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  Lacking eccentric control from an extended position when descending stairs.   REHAB POTENTIAL: Good  CLINICAL DECISION MAKING: Stable/uncomplicated  EVALUATION COMPLEXITY: Low   GOALS: Goals reviewed with patient? Yes  SHORT TERM GOALS: Target date: 12/01/23  ROM 0-120 Baseline: Goal status: INITIAL  2.  Full knee extension at heel strike in gait Baseline:  Goal status: INITIAL  3.  Able to ambulate a few steps without antalgic pattern, without AD Baseline:  Goal status: INITIAL    LONG TERM GOALS: Target date: POC date   Meet FOTO goal Baseline:  Goal status: INITIAL  2.  Perform stairs step over step, ascending and descending Baseline:  Goal status: INITIAL  3.  Average pain <=2/10 Baseline:  Goal status: INITIAL  4.  Return to gym program at [facility] Baseline:  Goal status: INITIAL    PLAN:  PT FREQUENCY: 1-2x/week  PT DURATION: 12 weeks  PLANNED INTERVENTIONS: 97164- PT Re-evaluation, 97110-Therapeutic exercises, 97530- Therapeutic activity, 97112- Neuromuscular re-education, 97535- Self Care, 16109- Manual therapy, [phone]- Gait training, [phone]- Aquatic Therapy, [phone]- Vasopneumatic device, Patient/Family education, Balance training, Stair training, Taping, Dry Needling, Joint mobilization, Spinal mobilization, Scar mobilization, Cryotherapy, and Moist heat.  PLAN FOR NEXT SESSION: ROM, quad activation  Oasis Goehring C. Carolann Brazell PT, DPT 11/24/23 1:23 PM

## 2023-11-27 ENCOUNTER — Ambulatory Visit (HOSPITAL_BASED_OUTPATIENT_CLINIC_OR_DEPARTMENT_OTHER): Payer: Managed Care, Other (non HMO)

## 2023-11-27 ENCOUNTER — Encounter (HOSPITAL_BASED_OUTPATIENT_CLINIC_OR_DEPARTMENT_OTHER): Payer: Self-pay

## 2023-11-27 ENCOUNTER — Ambulatory Visit: Payer: Managed Care, Other (non HMO) | Admitting: Orthopaedic Surgery

## 2023-11-27 ENCOUNTER — Ambulatory Visit (INDEPENDENT_AMBULATORY_CARE_PROVIDER_SITE_OTHER): Payer: Self-pay

## 2023-11-27 DIAGNOSIS — R262 Difficulty in walking, not elsewhere classified: Secondary | ICD-10-CM

## 2023-11-27 DIAGNOSIS — Z96652 Presence of left artificial knee joint: Secondary | ICD-10-CM | POA: Diagnosis not present

## 2023-11-27 DIAGNOSIS — M25562 Pain in left knee: Secondary | ICD-10-CM

## 2023-11-27 DIAGNOSIS — M25662 Stiffness of left knee, not elsewhere classified: Secondary | ICD-10-CM

## 2023-11-27 NOTE — Progress Notes (Signed)
Post-Op Visit Note   Patient: Lauren [name]           Date of Birth: 1960-12-28           MRN: [id number] Visit Date: 11/27/2023 PCP: Jac Canavan, PA-C   Assessment & Plan:  Chief Complaint:  Chief Complaint  Patient presents with   Left Knee - Routine Post Op, Follow-up   Visit Diagnoses:  1. H/O total knee replacement, left     Plan: Fredrika was 6 weeks status post left total knee arthroplasty from conversion of patellofemoral arthroplasty.  She is doing well overall.  Currently in outpatient PT.  Exam of the left knee shows a fully healed surgical scar.  Range of motion is progressing nicely.  Collaterals are stable.  X-rays show stable implant without any complications.  Advance activity as tolerated.  Dental prophylaxis reinforced.  Recheck in 6 weeks.  Follow-Up Instructions: Return in about 6 weeks (around 01/08/2024) for with lindsey.   Orders:  Orders Placed This Encounter  Procedures   XR Knee 1-2 Views Left   No orders of the defined types were placed in this encounter.   Imaging: XR Knee 1-2 Views Left Result Date: 11/27/2023 X-rays of the left knee show a stable left total knee replacement in good alignment.    PMFS History: Patient Active Problem List   Diagnosis Date Noted   Primary osteoarthritis of left knee 10/15/2023   Status post total left knee replacement 10/15/2023   Type 2 diabetes mellitus with hyperglycemia, without long-term current use of insulin (HCC) 02/20/2023   Aortic atherosclerosis (HCC) 02/20/2023   Primary biliary cholangitis (HCC) 10/06/2022   Kidney stone 06/30/2022   Encounter for health maintenance examination in adult 06/30/2022   History of gastric bypass 06/30/2022   Anemia 06/30/2022   Altered mental status 06/30/2022   Nausea and vomiting 06/30/2022   Other constipation 02/14/2022   Liver hematoma 02/05/2022   Elevated LFTs 01/12/2022   Allergic rhinitis due to pollen 09/12/2021   Pain in joints of both feet  09/12/2021   OSA (obstructive sleep apnea) 09/12/2021   Stage 3b chronic kidney disease (HCC) 07/18/2021   Elevated alkaline phosphatase level 07/18/2021   Elevated liver enzymes 07/18/2021   Chronic neck pain 07/13/2021   Chronic back pain 07/13/2021   Urge incontinence of urine 07/13/2021   Incontinence of feces 07/13/2021   Eczema 07/13/2021   Paresthesia of arm 07/13/2021   Status post left partial knee replacement 07/13/2021   History of thyroid cancer 07/13/2021   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   Hyperlipidemia 07/13/2021   Essential hypertension, benign 07/13/2021   Elevated uric acid in blood 07/13/2021   Insomnia 07/13/2021   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   Past Medical History:  Diagnosis Date   Anemia    CKD stage 3b, GFR 30-44 ml/min (HCC)    Complication of anesthesia    Diabetes mellitus, type 2 (HCC)    Eczema    GERD (gastroesophageal reflux disease)    Glaucoma    Gout    History of kidney stones    Hyperlipidemia    Hypertension    Metabolic dysfunction-associated steatohepatitis (MASH)    PONV (postoperative nausea and vomiting)    Primary biliary cholangitis (HCC)    Sarcoidosis    Sleep apnea    Thyroid cancer (HCC)     Family History  Problem Relation Age of Onset   Breast cancer Mother    Breast cancer Maternal Grandmother  Diabetes Mellitus II Paternal Grandfather    Throat cancer Paternal Grandfather    Stomach cancer Neg Hx    Esophageal cancer Neg Hx    Pancreatic cancer Neg Hx    Colon cancer Neg Hx     Past Surgical History:  Procedure Laterality Date   CATARACT EXTRACTION Bilateral    COLONOSCOPY     GASTRIC BYPASS     KNEE SURGERY Left 2017   LIVER BIOPSY  01/2022   Novosure     TOTAL KNEE REVISION Left 10/15/2023   Procedure: REVISION LEFT PARTIAL KNEE to TOTAL KNEE ARTHROPLASTY;  Surgeon: Tarry Kos, MD;  Location: [facility] OR;  Service: Orthopedics;  Laterality: Left;   TUBAL LIGATION     Social History    Occupational History   Not on file  Tobacco Use   Smoking status: Former    Current packs/day: 0.00    Average packs/day: 1.3 packs/day for 22.0 years (27.5 ttl pk-yrs)    Types: E-cigarettes, Cigarettes    Start date: 55    Quit date: 2002    Years since quitting: 22.9   Smokeless tobacco: Never   Tobacco comments:    Stopped smoking 66yrs ago  Vaping Use   Vaping status: Never Used  Substance and Sexual Activity   Alcohol use: Yes    Comment: Occ.   Drug use: Not Currently    Comment: gummies   Sexual activity: Yes

## 2023-11-27 NOTE — Therapy (Signed)
OUTPATIENT PHYSICAL THERAPY TREATMENT   Patient Name: Lauren [name] MRN: [id number] DOB:[birth year], 62 y.o., female Today's Date: 11/27/2023  END OF SESSION:  PT End of Session - 11/27/23 1603     Visit Number 3    Number of Visits 25    Date for PT Re-Evaluation 02/09/24    Authorization Type [employer]    PT Start Time 1604    PT Stop Time 1650    PT Time Calculation (min) 46 min    Activity Tolerance Patient tolerated treatment well    Behavior During Therapy WFL for tasks assessed/performed              Past Medical History:  Diagnosis Date   Anemia    CKD stage 3b, GFR 30-44 ml/min (HCC)    Complication of anesthesia    Diabetes mellitus, type 2 (HCC)    Eczema    GERD (gastroesophageal reflux disease)    Glaucoma    Gout    History of kidney stones    Hyperlipidemia    Hypertension    Metabolic dysfunction-associated steatohepatitis (MASH)    PONV (postoperative nausea and vomiting)    Primary biliary cholangitis (HCC)    Sarcoidosis    Sleep apnea    Thyroid cancer (HCC)    Past Surgical History:  Procedure Laterality Date   CATARACT EXTRACTION Bilateral    COLONOSCOPY     GASTRIC BYPASS     KNEE SURGERY Left 2017   LIVER BIOPSY  01/2022   Novosure     TOTAL KNEE REVISION Left 10/15/2023   Procedure: REVISION LEFT PARTIAL KNEE to TOTAL KNEE ARTHROPLASTY;  Surgeon: Tarry Kos, MD;  Location: [facility] OR;  Service: Orthopedics;  Laterality: Left;   TUBAL LIGATION     Patient Active Problem List   Diagnosis Date Noted   Primary osteoarthritis of left knee 10/15/2023   Status post total left knee replacement 10/15/2023   Type 2 diabetes mellitus with hyperglycemia, without long-term current use of insulin (HCC) 02/20/2023   Aortic atherosclerosis (HCC) 02/20/2023   Primary biliary cholangitis (HCC) 10/06/2022   Kidney stone 06/30/2022   Encounter for health maintenance examination in adult 06/30/2022   History of gastric bypass 06/30/2022   Anemia  06/30/2022   Altered mental status 06/30/2022   Nausea and vomiting 06/30/2022   Other constipation 02/14/2022   Liver hematoma 02/05/2022   Elevated LFTs 01/12/2022   Allergic rhinitis due to pollen 09/12/2021   Pain in joints of both feet 09/12/2021   OSA (obstructive sleep apnea) 09/12/2021   Stage 3b chronic kidney disease (HCC) 07/18/2021   Elevated alkaline phosphatase level 07/18/2021   Elevated liver enzymes 07/18/2021   Chronic neck pain 07/13/2021   Chronic back pain 07/13/2021   Urge incontinence of urine 07/13/2021   Incontinence of feces 07/13/2021   Eczema 07/13/2021   Paresthesia of arm 07/13/2021   Status post left partial knee replacement 07/13/2021   History of thyroid cancer 07/13/2021   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   Hyperlipidemia 07/13/2021   Essential hypertension, benign 07/13/2021   Elevated uric acid in blood 07/13/2021   Insomnia 07/13/2021   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     REFERRING PROVIDER: Gershon Mussel, MD  REFERRING DIAG:  Z96.652 (ICD-10-CM) - H/O total knee replacement, left      Rationale for Evaluation and Treatment: Rehabilitation  THERAPY DIAG:  Acute pain of left knee  Stiffness of left knee, not elsewhere classified  Difficulty in walking, not elsewhere classified  ONSET DATE: DOS 10/15/23   SUBJECTIVE:                                                                                                                                                                                           SUBJECTIVE STATEMENT: Pt reports more discomfort than pain.  PERTINENT HISTORY:  Partial TKA 2018  PAIN:  Are you having pain? Yes: NPRS scale: Minimal/10 Pain location: posterior and lateral thigh Pain description: Stiffness Aggravating factors: sitting Relieving factors: rest  PRECAUTIONS:  None  RED FLAGS: None   WEIGHT BEARING RESTRICTIONS:  No  FALLS:  Has patient fallen in last 6 months? No  LIVING  ENVIRONMENT: 14 steps at home  OCCUPATION:  Not working  PLOF:  Independent  PATIENT GOALS:  Stairs, walking, getting ready to move in June, play with grand children.    OBJECTIVE:  Note: Objective measures were completed at Evaluation unless otherwise noted.  PATIENT SURVEYS:  FOTO 41  COGNITIVE STATUS: Within functional limits for tasks assessed   EDEMA:  Yes: Lt knee  GAIT: Eval: with RW, lacking full knee ext at heel strike   Body Part #1 Knee  PALPATION: Spongy end feel  LOWER EXTREMITY ROM:     Passive  Left eval Lt  12/14  Hip flexion    Hip extension    Hip abduction    Hip adduction    Hip internal rotation    Hip external rotation    Knee flexion 100 114  Knee extension 0    (Blank rows = not tested)       TREATMENT:                                                                                                                                Treatment                            12/17:  PROM SLR 2x10 Bridges 2x10 Squats 2 x 10 LAQ 2 pound ankle weight 3-second hold 2 x 10  Step ups 6 inch step 2 x 10 Eccentric forward step downs forward step 2 x 10 Standing hip abduction/extension 2 pound weights 2 x 10 each HR/TR 2 x 10 Marching on Airex 2 x 10 Gait in hall x 1 lap   Treatment                            12/14:  NuStep level 3 for 4 minutes, level 5 for 1 minute Step and return over hurdle Gait training-cues for pressure over second digit and knee flexion in swing through Heel raises Gastroc stretch Single-leg stance with glutes set Straight leg raise without touching the table in between reps, straight leg raise circles Supine bridges at endrange knee flexion Side-lying arcs Stair training-glutes set to ascend, controlled eccentric quad to descend   DATE: eval 11/12/23  Supine quad set, + SLR Knee flexion Self massage with tennis ball Supine piriformis stretch Seated hamstring stretch   PATIENT EDUCATION:  Education  details: Anatomy of condition, POC, HEP, exercise form/rationale Person educated: Spouse Education method: Explanation, Demonstration, Actor cues, Verbal cues, and Handouts Education comprehension: verbalized understanding, returned demonstration, verbal cues required, tactile cues required, and needs further education  HOME EXERCISE PROGRAM:  [email or web address]  Access Code: [email or web address]   ASSESSMENT:  CLINICAL IMPRESSION: Pt with continued decreased heel contact with gait, though improved pattern.  Patient mains tight into endrange knee flexion.  Encouraged her to continue with his lives at home.  Good tolerance for advancements to therapeutic exercise and activity today.  No pain with eccentric forward step down from 4 inch height.  Does report quad fatigue with this.  Educated on taking knee brace off at night.  Patient to continue icing and elevating to improve residual swelling.  Updated HEP reviewed with patient.  REHAB POTENTIAL: Good  CLINICAL DECISION MAKING: Stable/uncomplicated  EVALUATION COMPLEXITY: Low   GOALS: Goals reviewed with patient? Yes  SHORT TERM GOALS: Target date: 12/01/23  ROM 0-120 Baseline: Goal status: INITIAL  2.  Full knee extension at heel strike in gait Baseline:  Goal status: INITIAL  3.  Able to ambulate a few steps without antalgic pattern, without AD Baseline:  Goal status: INITIAL    LONG TERM GOALS: Target date: POC date   Meet FOTO goal Baseline:  Goal status: INITIAL  2.  Perform stairs step over step, ascending and descending Baseline:  Goal status: INITIAL  3.  Average pain <=2/10 Baseline:  Goal status: INITIAL  4.  Return to gym program at [facility] Baseline:  Goal status: INITIAL    PLAN:  PT FREQUENCY: 1-2x/week  PT DURATION: 12 weeks  PLANNED INTERVENTIONS: 97164- PT Re-evaluation, 97110-Therapeutic exercises, 97530- Therapeutic activity, 97112- Neuromuscular re-education, 97535- Self Care, 51884-  Manual therapy, [phone]- Gait training, [phone]- Aquatic Therapy, [phone]- Vasopneumatic device, Patient/Family education, Balance training, Stair training, Taping, Dry Needling, Joint mobilization, Spinal mobilization, Scar mobilization, Cryotherapy, and Moist heat.  PLAN FOR NEXT SESSION: ROM, quad activation  Jessica C. Hightower PT, DPT 11/27/23 5:00 PM

## 2023-12-06 ENCOUNTER — Ambulatory Visit (HOSPITAL_BASED_OUTPATIENT_CLINIC_OR_DEPARTMENT_OTHER): Payer: Managed Care, Other (non HMO) | Admitting: Physical Therapy

## 2023-12-06 DIAGNOSIS — M25562 Pain in left knee: Secondary | ICD-10-CM | POA: Diagnosis not present

## 2023-12-06 DIAGNOSIS — R262 Difficulty in walking, not elsewhere classified: Secondary | ICD-10-CM

## 2023-12-06 DIAGNOSIS — M25662 Stiffness of left knee, not elsewhere classified: Secondary | ICD-10-CM

## 2023-12-06 NOTE — Therapy (Signed)
OUTPATIENT PHYSICAL THERAPY TREATMENT   Patient Name: Lauren [name] MRN: [id number] DOB:[birth year], 62 y.o., female Today's Date: 12/07/2023  END OF SESSION:  PT End of Session - 12/07/23 0954     Visit Number 4    Number of Visits 25    Date for PT Re-Evaluation 02/09/24    Authorization Type [employer]    PT Start Time 1600    PT Stop Time 1643    PT Time Calculation (min) 43 min    Activity Tolerance Patient tolerated treatment well    Behavior During Therapy WFL for tasks assessed/performed               Past Medical History:  Diagnosis Date   Anemia    CKD stage 3b, GFR 30-44 ml/min (HCC)    Complication of anesthesia    Diabetes mellitus, type 2 (HCC)    Eczema    GERD (gastroesophageal reflux disease)    Glaucoma    Gout    History of kidney stones    Hyperlipidemia    Hypertension    Metabolic dysfunction-associated steatohepatitis (MASH)    PONV (postoperative nausea and vomiting)    Primary biliary cholangitis (HCC)    Sarcoidosis    Sleep apnea    Thyroid cancer (HCC)    Past Surgical History:  Procedure Laterality Date   CATARACT EXTRACTION Bilateral    COLONOSCOPY     GASTRIC BYPASS     KNEE SURGERY Left 2017   LIVER BIOPSY  01/2022   Novosure     TOTAL KNEE REVISION Left 10/15/2023   Procedure: REVISION LEFT PARTIAL KNEE to TOTAL KNEE ARTHROPLASTY;  Surgeon: Tarry Kos, MD;  Location: [facility] OR;  Service: Orthopedics;  Laterality: Left;   TUBAL LIGATION     Patient Active Problem List   Diagnosis Date Noted   Primary osteoarthritis of left knee 10/15/2023   Status post total left knee replacement 10/15/2023   Type 2 diabetes mellitus with hyperglycemia, without long-term current use of insulin (HCC) 02/20/2023   Aortic atherosclerosis (HCC) 02/20/2023   Primary biliary cholangitis (HCC) 10/06/2022   Kidney stone 06/30/2022   Encounter for health maintenance examination in adult 06/30/2022   History of gastric bypass 06/30/2022   Anemia  06/30/2022   Altered mental status 06/30/2022   Nausea and vomiting 06/30/2022   Other constipation 02/14/2022   Liver hematoma 02/05/2022   Elevated LFTs 01/12/2022   Allergic rhinitis due to pollen 09/12/2021   Pain in joints of both feet 09/12/2021   OSA (obstructive sleep apnea) 09/12/2021   Stage 3b chronic kidney disease (HCC) 07/18/2021   Elevated alkaline phosphatase level 07/18/2021   Elevated liver enzymes 07/18/2021   Chronic neck pain 07/13/2021   Chronic back pain 07/13/2021   Urge incontinence of urine 07/13/2021   Incontinence of feces 07/13/2021   Eczema 07/13/2021   Paresthesia of arm 07/13/2021   Status post left partial knee replacement 07/13/2021   History of thyroid cancer 07/13/2021   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   Hyperlipidemia 07/13/2021   Essential hypertension, benign 07/13/2021   Elevated uric acid in blood 07/13/2021   Insomnia 07/13/2021   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     REFERRING PROVIDER: Gershon Mussel, MD  REFERRING DIAG:  Z96.652 (ICD-10-CM) - H/O total knee replacement, left      Rationale for Evaluation and Treatment: Rehabilitation  THERAPY DIAG:  Acute pain of left knee  Stiffness of left knee, not elsewhere classified  Difficulty in walking, not elsewhere  classified  ONSET DATE: DOS 10/15/23   SUBJECTIVE:                                                                                                                                                                                           SUBJECTIVE STATEMENT: Pt reports it is doing pretty well.  PERTINENT HISTORY:  Partial TKA 2018  PAIN:  Are you having pain? Yes: NPRS scale: Minimal/10 Pain location: posterior and lateral thigh Pain description: Stiffness Aggravating factors: sitting Relieving factors: rest  PRECAUTIONS:  None  RED FLAGS: None   WEIGHT BEARING RESTRICTIONS:  No  FALLS:  Has patient fallen in last 6 months? No  LIVING  ENVIRONMENT: 14 steps at home  OCCUPATION:  Not working  PLOF:  Independent  PATIENT GOALS:  Stairs, walking, getting ready to move in June, play with grand children.    OBJECTIVE:  Note: Objective measures were completed at Evaluation unless otherwise noted.  PATIENT SURVEYS:  FOTO 41  COGNITIVE STATUS: Within functional limits for tasks assessed   EDEMA:  Yes: Lt knee  GAIT: Eval: with RW, lacking full knee ext at heel strike   Body Part #1 Knee  PALPATION: Spongy end feel  LOWER EXTREMITY ROM:     Passive  Left eval Lt  12/14 Left   Hip flexion     Hip extension     Hip abduction     Hip adduction     Hip internal rotation     Hip external rotation     Knee flexion 100 114 115  Knee extension 0  4   (Blank rows = not tested)       TREATMENT:                                                                                                                                 12/26  Nu-step: 5 min L3   Manual: PROM into flexion and extension. STM to hamstring. Review of self streth for flexion and extension   Supine:  SLR 3x10  Bridges 2x10  Seated:  LAQ 2x10    Standing: Step up 6 inch 2x10  HR/TR 2x10  Marching on air-ex 2x10  Standing hip abduction/extension 2 pound weights 2 x 10 each     Treatment                            12/17:  PROM SLR 2x10 Bridges 2x10 Squats 2 x 10 LAQ 2 pound ankle weight 3-second hold 2 x 10 Step ups 6 inch step 2 x 10 Eccentric forward step downs forward step 2 x 10 Standing hip abduction/extension 2 pound weights 2 x 10 each HR/TR 2 x 10 Marching on Airex 2 x 10 Gait in hall x 1 lap   Treatment                            12/14:  NuStep level 3 for 4 minutes, level 5 for 1 minute Step and return over hurdle Gait training-cues for pressure over second digit and knee flexion in swing through Heel raises Gastroc stretch Single-leg stance with glutes set Straight leg raise without touching  the table in between reps, straight leg raise circles Supine bridges at endrange knee flexion Side-lying arcs Stair training-glutes set to ascend, controlled eccentric quad to descend   DATE: eval 11/12/23  Supine quad set, + SLR Knee flexion Self massage with tennis ball Supine piriformis stretch Seated hamstring stretch   PATIENT EDUCATION:  Education details: Anatomy of condition, POC, HEP, exercise form/rationale Person educated: Spouse Education method: Explanation, Facilities manager, Actor cues, Verbal cues, and Handouts Education comprehension: verbalized understanding, returned demonstration, verbal cues required, tactile cues required, and needs further education  HOME EXERCISE PROGRAM:  [email or web address]  Access Code: [email or web address]   ASSESSMENT:  CLINICAL IMPRESSION: The patient is making good progress. Her knee total arc eval measured at 4-115. She tolerated there-ex well. We kept her there-ex constant today. She had the most difficulty with a step down. See below for goal specific progress   REHAB POTENTIAL: Good  CLINICAL DECISION MAKING: Stable/uncomplicated  EVALUATION COMPLEXITY: Low   GOALS: Goals reviewed with patient? Yes  SHORT TERM GOALS: Target date: 12/01/23  ROM 0-120 Baseline: Goal status: 4-115 12/26  2.  Full knee extension at heel strike in gait Baseline:  Goal status: continues to work on extension 12/27  3.  Able to ambulate a few steps without antalgic pattern, without AD Baseline:  Goal status: INITIAL    LONG TERM GOALS: Target date: POC date   Meet FOTO goal Baseline:  Goal status: INITIAL  2.  Perform stairs step over step, ascending and descending Baseline:  Goal status: INITIAL  3.  Average pain <=2/10 Baseline:  Goal status: INITIAL  4.  Return to gym program at [facility] Baseline:  Goal status: INITIAL    PLAN:  PT FREQUENCY: 1-2x/week  PT DURATION: 12 weeks  PLANNED INTERVENTIONS: 97164- PT  Re-evaluation, 97110-Therapeutic exercises, 97530- Therapeutic activity, 97112- Neuromuscular re-education, 97535- Self Care, 11914- Manual therapy, [phone]- Gait training, [phone]- Aquatic Therapy, [phone]- Vasopneumatic device, Patient/Family education, Balance training, Stair training, Taping, Dry Needling, Joint mobilization, Spinal mobilization, Scar mobilization, Cryotherapy, and Moist heat.  PLAN FOR NEXT SESSION: ROM, quad activation  Jessica C. Hightower PT, DPT 12/07/23 10:00 AM

## 2023-12-07 ENCOUNTER — Encounter (HOSPITAL_BASED_OUTPATIENT_CLINIC_OR_DEPARTMENT_OTHER): Payer: Self-pay | Admitting: Physical Therapy

## 2023-12-10 ENCOUNTER — Ambulatory Visit (HOSPITAL_BASED_OUTPATIENT_CLINIC_OR_DEPARTMENT_OTHER): Payer: Managed Care, Other (non HMO)

## 2023-12-10 ENCOUNTER — Encounter (HOSPITAL_BASED_OUTPATIENT_CLINIC_OR_DEPARTMENT_OTHER): Payer: Self-pay

## 2023-12-10 DIAGNOSIS — R262 Difficulty in walking, not elsewhere classified: Secondary | ICD-10-CM

## 2023-12-10 DIAGNOSIS — M25562 Pain in left knee: Secondary | ICD-10-CM

## 2023-12-10 DIAGNOSIS — M25662 Stiffness of left knee, not elsewhere classified: Secondary | ICD-10-CM

## 2023-12-10 NOTE — Therapy (Signed)
OUTPATIENT PHYSICAL THERAPY TREATMENT   Patient Name: Lauren [name] MRN: [id number] DOB:1961/03/23, 62 y.o., female Today's Date: 12/10/2023  END OF SESSION:  PT End of Session - 12/10/23 1558     Visit Number 5    Number of Visits 25    Date for PT Re-Evaluation 02/09/24    Authorization Type [employer]    PT Start Time 1545    PT Stop Time 1633    PT Time Calculation (min) 48 min    Activity Tolerance Patient tolerated treatment well    Behavior During Therapy WFL for tasks assessed/performed                Past Medical History:  Diagnosis Date   Anemia    CKD stage 3b, GFR 30-44 ml/min (HCC)    Complication of anesthesia    Diabetes mellitus, type 2 (HCC)    Eczema    GERD (gastroesophageal reflux disease)    Glaucoma    Gout    History of kidney stones    Hyperlipidemia    Hypertension    Metabolic dysfunction-associated steatohepatitis (MASH)    PONV (postoperative nausea and vomiting)    Primary biliary cholangitis (HCC)    Sarcoidosis    Sleep apnea    Thyroid cancer (HCC)    Past Surgical History:  Procedure Laterality Date   CATARACT EXTRACTION Bilateral    COLONOSCOPY     GASTRIC BYPASS     KNEE SURGERY Left 2017   LIVER BIOPSY  01/2022   Novosure     TOTAL KNEE REVISION Left 10/15/2023   Procedure: REVISION LEFT PARTIAL KNEE to TOTAL KNEE ARTHROPLASTY;  Surgeon: Tarry Kos, MD;  Location: [facility] OR;  Service: Orthopedics;  Laterality: Left;   TUBAL LIGATION     Patient Active Problem List   Diagnosis Date Noted   Primary osteoarthritis of left knee 10/15/2023   Status post total left knee replacement 10/15/2023   Type 2 diabetes mellitus with hyperglycemia, without long-term current use of insulin (HCC) 02/20/2023   Aortic atherosclerosis (HCC) 02/20/2023   Primary biliary cholangitis (HCC) 10/06/2022   Kidney stone 06/30/2022   Encounter for health maintenance examination in adult 06/30/2022   History of gastric bypass 06/30/2022   Anemia  06/30/2022   Altered mental status 06/30/2022   Nausea and vomiting 06/30/2022   Other constipation 02/14/2022   Liver hematoma 02/05/2022   Elevated LFTs 01/12/2022   Allergic rhinitis due to pollen 09/12/2021   Pain in joints of both feet 09/12/2021   OSA (obstructive sleep apnea) 09/12/2021   Stage 3b chronic kidney disease (HCC) 07/18/2021   Elevated alkaline phosphatase level 07/18/2021   Elevated liver enzymes 07/18/2021   Chronic neck pain 07/13/2021   Chronic back pain 07/13/2021   Urge incontinence of urine 07/13/2021   Incontinence of feces 07/13/2021   Eczema 07/13/2021   Paresthesia of arm 07/13/2021   Status post left partial knee replacement 07/13/2021   History of thyroid cancer 07/13/2021   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   Hyperlipidemia 07/13/2021   Essential hypertension, benign 07/13/2021   Elevated uric acid in blood 07/13/2021   Insomnia 07/13/2021   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     REFERRING PROVIDER: Gershon Mussel, MD  REFERRING DIAG:  Z96.652 (ICD-10-CM) - H/O total knee replacement, left      Rationale for Evaluation and Treatment: Rehabilitation  THERAPY DIAG:  Acute pain of left knee  Stiffness of left knee, not elsewhere classified  Difficulty in walking, not  elsewhere classified  ONSET DATE: DOS 10/15/23   SUBJECTIVE:                                                                                                                                                                                           SUBJECTIVE STATEMENT: Pt reports it is doing pretty well.  PERTINENT HISTORY:  Partial TKA 2018  PAIN:  Are you having pain? Yes: NPRS scale: Minimal/10 Pain location: posterior and lateral thigh Pain description: Stiffness Aggravating factors: sitting Relieving factors: rest  PRECAUTIONS:  None  RED FLAGS: None   WEIGHT BEARING RESTRICTIONS:  No  FALLS:  Has patient fallen in last 6 months? No  LIVING  ENVIRONMENT: 14 steps at home  OCCUPATION:  Not working  PLOF:  Independent  PATIENT GOALS:  Stairs, walking, getting ready to move in June, play with grand children.    OBJECTIVE:  Note: Objective measures were completed at Evaluation unless otherwise noted.  PATIENT SURVEYS:  FOTO 41  COGNITIVE STATUS: Within functional limits for tasks assessed   EDEMA:  Yes: Lt knee  GAIT: Eval: with RW, lacking full knee ext at heel strike   Body Part #1 Knee  PALPATION: Spongy end feel  LOWER EXTREMITY ROM:     Passive  Left eval Lt  12/14 Left   Hip flexion     Hip extension     Hip abduction     Hip adduction     Hip internal rotation     Hip external rotation     Knee flexion 100 114 115  Knee extension 0  4   (Blank rows = not tested)       TREATMENT:                                                                                                                                12/30  Nu-step: 5 min L5  PROM L knee Tib/fem mobilizations posterior glide grade II-III Patella mobilizations  Supine:  SLR 3x10  Bridges 2x10   Seated:  LAQ 3x10 2.5# Seated HSC  RTB 2x10   Standing: Step up 6 inch 2x10  HR/TR  on airex 2x10  Squats at rail 2x10 Marching on air-ex 2x10  SLS trials 5-15 seconds Standing hip abduction/extension 2.5 pound weights 2 x 15 each  12/26  Nu-step: 5 min L3   Manual: PROM into flexion and extension. STM to hamstring. Review of self streth for flexion and extension   Supine:  SLR 3x10  Bridges 2x10   Seated:  LAQ 2x10    Standing: Step up 6 inch 2x10  HR/TR 2x10  Marching on air-ex 2x10  Standing hip abduction/extension 2 pound weights 2 x 10 each     Treatment                            12/17:  PROM SLR 2x10 Bridges 2x10 Squats 2 x 10 LAQ 2 pound ankle weight 3-second hold 2 x 10 Step ups 6 inch step 2 x 10 Eccentric forward step downs forward step 2 x 10 Standing hip abduction/extension 2 pound  weights 2 x 10 each HR/TR 2 x 10 Marching on Airex 2 x 10 Gait in hall x 1 lap   Treatment                            12/14:  NuStep level 3 for 4 minutes, level 5 for 1 minute Step and return over hurdle Gait training-cues for pressure over second digit and knee flexion in swing through Heel raises Gastroc stretch Single-leg stance with glutes set Straight leg raise without touching the table in between reps, straight leg raise circles Supine bridges at endrange knee flexion Side-lying arcs Stair training-glutes set to ascend, controlled eccentric quad to descend   DATE: eval 11/12/23  Supine quad set, + SLR Knee flexion Self massage with tennis ball Supine piriformis stretch Seated hamstring stretch   PATIENT EDUCATION:  Education details: Anatomy of condition, POC, HEP, exercise form/rationale Person educated: Spouse Education method: Explanation, Facilities manager, Actor cues, Verbal cues, and Handouts Education comprehension: verbalized understanding, returned demonstration, verbal cues required, tactile cues required, and needs further education  HOME EXERCISE PROGRAM:  [email or web address]  Access Code: [email or web address]   ASSESSMENT:  CLINICAL IMPRESSION: Pt continues to measure around 114deg knee flexion. Instructed her to continue with heel slides with end range hold at home. Good tolerance for therex and threAct. Some lateral knee pain with second set of squats that dissipated when resting. Will continue to work on increased ROM and strength as tolerated.  REHAB POTENTIAL: Good  CLINICAL DECISION MAKING: Stable/uncomplicated  EVALUATION COMPLEXITY: Low   GOALS: Goals reviewed with patient? Yes  SHORT TERM GOALS: Target date: 12/01/23  ROM 0-120 Baseline: Goal status: 4-115 12/26  2.  Full knee extension at heel strike in gait Baseline:  Goal status: continues to work on extension 12/27  3.  Able to ambulate a few steps without antalgic pattern,  without AD Baseline:  Goal status: INITIAL    LONG TERM GOALS: Target date: POC date   Meet FOTO goal Baseline:  Goal status: INITIAL  2.  Perform stairs step over step, ascending and descending Baseline:  Goal status: INITIAL  3.  Average pain <=2/10 Baseline:  Goal status: INITIAL  4.  Return to gym program at [facility] Baseline:  Goal status: INITIAL    PLAN:  PT FREQUENCY: 1-2x/week  PT DURATION: 12 weeks  PLANNED INTERVENTIONS: 47829- PT Re-evaluation, 97110-Therapeutic  exercises, 97530- Therapeutic activity, [id number]- Neuromuscular re-education, [phone]- Self Care, [id number]- Manual therapy, [phone]- Gait training, [phone]- Aquatic Therapy, [phone]- Vasopneumatic device, Patient/Family education, Balance training, Stair training, Taping, Dry Needling, Joint mobilization, Spinal mobilization, Scar mobilization, Cryotherapy, and Moist heat.  PLAN FOR NEXT SESSION: ROM, quad activation  Riki Altes, PTA  12/10/23 4:48 PM

## 2023-12-18 ENCOUNTER — Ambulatory Visit (HOSPITAL_BASED_OUTPATIENT_CLINIC_OR_DEPARTMENT_OTHER): Payer: Managed Care, Other (non HMO) | Attending: Physician Assistant

## 2023-12-18 ENCOUNTER — Encounter (HOSPITAL_BASED_OUTPATIENT_CLINIC_OR_DEPARTMENT_OTHER): Payer: Self-pay

## 2023-12-18 DIAGNOSIS — M25662 Stiffness of left knee, not elsewhere classified: Secondary | ICD-10-CM | POA: Insufficient documentation

## 2023-12-18 DIAGNOSIS — M25562 Pain in left knee: Secondary | ICD-10-CM | POA: Insufficient documentation

## 2023-12-18 DIAGNOSIS — R262 Difficulty in walking, not elsewhere classified: Secondary | ICD-10-CM | POA: Insufficient documentation

## 2023-12-20 ENCOUNTER — Encounter (HOSPITAL_BASED_OUTPATIENT_CLINIC_OR_DEPARTMENT_OTHER): Payer: Self-pay

## 2023-12-20 ENCOUNTER — Ambulatory Visit (HOSPITAL_BASED_OUTPATIENT_CLINIC_OR_DEPARTMENT_OTHER): Payer: Managed Care, Other (non HMO)

## 2023-12-20 DIAGNOSIS — M25562 Pain in left knee: Secondary | ICD-10-CM

## 2023-12-20 DIAGNOSIS — R262 Difficulty in walking, not elsewhere classified: Secondary | ICD-10-CM

## 2023-12-20 DIAGNOSIS — M25662 Stiffness of left knee, not elsewhere classified: Secondary | ICD-10-CM

## 2023-12-20 NOTE — Therapy (Signed)
 OUTPATIENT PHYSICAL THERAPY TREATMENT   Patient Name: Lauren [name] MRN: [id number] DOB:[birth year], 63 y.o., female Today's Date: 12/20/2023  END OF SESSION:  PT End of Session - 12/20/23 1157     Visit Number 6    Number of Visits 25    Date for PT Re-Evaluation 02/09/24    Authorization Type [employer]    PT Start Time 1153    PT Stop Time 1233    PT Time Calculation (min) 40 min    Activity Tolerance Patient tolerated treatment well    Behavior During Therapy WFL for tasks assessed/performed                 Past Medical History:  Diagnosis Date   Anemia    CKD stage 3b, GFR 30-44 ml/min (HCC)    Complication of anesthesia    Diabetes mellitus, type 2 (HCC)    Eczema    GERD (gastroesophageal reflux disease)    Glaucoma    Gout    History of kidney stones    Hyperlipidemia    Hypertension    Metabolic dysfunction-associated steatohepatitis (MASH)    PONV (postoperative nausea and vomiting)    Primary biliary cholangitis (HCC)    Sarcoidosis    Sleep apnea    Thyroid cancer (HCC)    Past Surgical History:  Procedure Laterality Date   CATARACT EXTRACTION Bilateral    COLONOSCOPY     GASTRIC BYPASS     KNEE SURGERY Left 2017   LIVER BIOPSY  01/2022   Novosure     TOTAL KNEE REVISION Left 10/15/2023   Procedure: REVISION LEFT PARTIAL KNEE to TOTAL KNEE ARTHROPLASTY;  Surgeon: Jerri Kay HERO, MD;  Location: [facility] OR;  Service: Orthopedics;  Laterality: Left;   TUBAL LIGATION     Patient Active Problem List   Diagnosis Date Noted   Primary osteoarthritis of left knee 10/15/2023   Status post total left knee replacement 10/15/2023   Type 2 diabetes mellitus with hyperglycemia, without long-term current use of insulin  (HCC) 02/20/2023   Aortic atherosclerosis (HCC) 02/20/2023   Primary biliary cholangitis (HCC) 10/06/2022   Kidney stone 06/30/2022   Encounter for health maintenance examination in adult 06/30/2022   History of gastric bypass 06/30/2022   Anemia  06/30/2022   Altered mental status 06/30/2022   Nausea and vomiting 06/30/2022   Other constipation 02/14/2022   Liver hematoma 02/05/2022   Elevated LFTs 01/12/2022   Allergic rhinitis due to pollen 09/12/2021   Pain in joints of both feet 09/12/2021   OSA (obstructive sleep apnea) 09/12/2021   Stage 3b chronic kidney disease (HCC) 07/18/2021   Elevated alkaline phosphatase level 07/18/2021   Elevated liver enzymes 07/18/2021   Chronic neck pain 07/13/2021   Chronic back pain 07/13/2021   Urge incontinence of urine 07/13/2021   Incontinence of feces 07/13/2021   Eczema 07/13/2021   Paresthesia of arm 07/13/2021   Status post left partial knee replacement 07/13/2021   History of thyroid cancer 07/13/2021   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   Hyperlipidemia 07/13/2021   Essential hypertension, benign 07/13/2021   Elevated uric acid in blood 07/13/2021   Insomnia 07/13/2021   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     REFERRING PROVIDER: Kay Jerri, MD  REFERRING DIAG:  Z96.652 (ICD-10-CM) - H/O total knee replacement, left      Rationale for Evaluation and Treatment: Rehabilitation  THERAPY DIAG:  Acute pain of left knee  Stiffness of left knee, not elsewhere classified  Difficulty in walking,  not elsewhere classified  ONSET DATE: DOS 10/15/23   SUBJECTIVE:                                                                                                                                                                                           SUBJECTIVE STATEMENT: Pt reports increased pain today to 6/10. Unsure why. Pt reports ongoing swelling with activity.   PERTINENT HISTORY:  Partial TKA 2018  PAIN:  Are you having pain? Yes: NPRS scale: 6/10 Pain location: posterior and lateral thigh Pain description: Stiffness Aggravating factors: sitting Relieving factors: rest  PRECAUTIONS:  None  RED FLAGS: None   WEIGHT BEARING RESTRICTIONS:   No  FALLS:  Has patient fallen in last 6 months? No  LIVING ENVIRONMENT: 14 steps at home  OCCUPATION:  Not working  PLOF:  Independent  PATIENT GOALS:  Stairs, walking, getting ready to move in June, play with grand children.    OBJECTIVE:  Note: Objective measures were completed at Evaluation unless otherwise noted.  PATIENT SURVEYS:  FOTO 41  COGNITIVE STATUS: Within functional limits for tasks assessed   EDEMA:  Yes: Lt knee  GAIT: Eval: with RW, lacking full knee ext at heel strike   Body Part #1 Knee  PALPATION: Spongy end feel  LOWER EXTREMITY ROM:     Passive  Left eval Lt  12/14 Left   Hip flexion     Hip extension     Hip abduction     Hip adduction     Hip internal rotation     Hip external rotation     Knee flexion 100 114 115  Knee extension 0  4   (Blank rows = not tested)       TREATMENT:                                                                                                                                1/9  Nu-step: 5 min L5  PROM L knee STM to lateral quads Patella mobilizations  Supine:  SLR 3x10  Bridges 2x10  Seated:  LAQ 3#- stopped after 5 reps due to lateral knee discomfort Long sit HSS 30sec x3      Standing:  Standing marches  Standing calf stretch 30seconds x3  12/30  Nu-step: 5 min L5  PROM L knee Tib/fem mobilizations posterior glide grade II-III Patella mobilizations  Supine:  SLR 3x10  Bridges 2x10   Seated:  LAQ 3x10 2.5# Seated HSC RTB 2x10   Standing: Step up 6 inch 2x10  HR/TR  on airex 2x10  Squats at rail 2x10 Marching on air-ex 2x10  SLS trials 5-15 seconds Standing hip abduction/extension 2.5 pound weights 2 x 15 each  12/26  Nu-step: 5 min L3   Manual: PROM into flexion and extension. STM to hamstring. Review of self streth for flexion and extension   Supine:  SLR 3x10  Bridges 2x10   Seated:  LAQ 2x10    Standing: Step up 6 inch 2x10  HR/TR  2x10  Marching on air-ex 2x10  Standing hip abduction/extension 2 pound weights 2 x 10 each     Treatment                            12/17:  PROM SLR 2x10 Bridges 2x10 Squats 2 x 10 LAQ 2 pound ankle weight 3-second hold 2 x 10 Step ups 6 inch step 2 x 10 Eccentric forward step downs forward step 2 x 10 Standing hip abduction/extension 2 pound weights 2 x 10 each HR/TR 2 x 10 Marching on Airex 2 x 10 Gait in hall x 1 lap   Treatment                            12/14:  NuStep level 3 for 4 minutes, level 5 for 1 minute Step and return over hurdle Gait training-cues for pressure over second digit and knee flexion in swing through Heel raises Gastroc stretch Single-leg stance with glutes set Straight leg raise without touching the table in between reps, straight leg raise circles Supine bridges at endrange knee flexion Side-lying arcs Stair training-glutes set to ascend, controlled eccentric quad to descend  PATIENT EDUCATION:  Education details: Teacher, Music of condition, POC, HEP, exercise form/rationale Person educated: Spouse Education method: Programmer, Multimedia, Facilities Manager, Actor cues, Verbal cues, and Handouts Education comprehension: verbalized understanding, returned demonstration, verbal cues required, tactile cues required, and needs further education  HOME EXERCISE PROGRAM:  [email or web address]  Access Code: [email or web address]   ASSESSMENT:  CLINICAL IMPRESSION: Pt c/o tingling over patella with patella mobilizations. She remains stiff into knee flexion. Measured at 114deg with overpressure in seated position. Held LAQ today as pt reported pain in lateral quads. She was tender to palpation of this area as well as gastroc and lateral calf. Performed gentle STM here to decrease restrictions.Instructed pt in HSS and gastroc stretching to decrease tightness sin posterior LE. Held weight bearing strengthening due to c/o increased pain. Will monitor her pain level as we  progress.    REHAB POTENTIAL: Good  CLINICAL DECISION MAKING: Stable/uncomplicated  EVALUATION COMPLEXITY: Low   GOALS: Goals reviewed with patient? Yes  SHORT TERM GOALS: Target date: 12/01/23  ROM 0-120 Baseline: Goal status: 4-115 12/26  2.  Full knee extension at heel strike in gait Baseline:  Goal status: continues to work on extension 12/27  3.  Able to ambulate a few steps without antalgic pattern, without AD Baseline:  Goal status: INITIAL  LONG TERM GOALS: Target date: POC date   Meet FOTO goal Baseline:  Goal status: INITIAL  2.  Perform stairs step over step, ascending and descending Baseline:  Goal status: INITIAL  3.  Average pain <=2/10 Baseline:  Goal status: INITIAL  4.  Return to gym program at [facility] Baseline:  Goal status: INITIAL    PLAN:  PT FREQUENCY: 1-2x/week  PT DURATION: 12 weeks  PLANNED INTERVENTIONS: 97164- PT Re-evaluation, 97110-Therapeutic exercises, 97530- Therapeutic activity, 97112- Neuromuscular re-education, 97535- Self Care, 02859- Manual therapy, [phone]- Gait training, [phone]- Aquatic Therapy, [phone]- Vasopneumatic device, Patient/Family education, Balance training, Stair training, Taping, Dry Needling, Joint mobilization, Spinal mobilization, Scar mobilization, Cryotherapy, and Moist heat.  PLAN FOR NEXT SESSION: ROM, quad activation  Abrial Arrighi, PTA  12/20/23 1:40 PM

## 2023-12-26 ENCOUNTER — Ambulatory Visit (HOSPITAL_BASED_OUTPATIENT_CLINIC_OR_DEPARTMENT_OTHER): Payer: Managed Care, Other (non HMO) | Admitting: Physical Therapy

## 2023-12-26 ENCOUNTER — Encounter (HOSPITAL_BASED_OUTPATIENT_CLINIC_OR_DEPARTMENT_OTHER): Payer: Self-pay | Admitting: Physical Therapy

## 2023-12-26 DIAGNOSIS — M25562 Pain in left knee: Secondary | ICD-10-CM

## 2023-12-26 DIAGNOSIS — M25662 Stiffness of left knee, not elsewhere classified: Secondary | ICD-10-CM

## 2023-12-26 DIAGNOSIS — R262 Difficulty in walking, not elsewhere classified: Secondary | ICD-10-CM

## 2023-12-26 NOTE — Therapy (Signed)
OUTPATIENT PHYSICAL THERAPY TREATMENT   Patient Name: Lauren [name] MRN: [id number] DOB:01-12-1961, 63 y.o., female Today's Date: 12/26/2023  END OF SESSION:  PT End of Session - 12/26/23 1526     Visit Number 7    Number of Visits 25    Date for PT Re-Evaluation 02/09/24    Authorization Type [employer]    PT Start Time 1600    PT Stop Time 1643    PT Time Calculation (min) 43 min    Activity Tolerance Patient tolerated treatment well    Behavior During Therapy WFL for tasks assessed/performed                 Past Medical History:  Diagnosis Date   Anemia    CKD stage 3b, GFR 30-44 ml/min (HCC)    Complication of anesthesia    Diabetes mellitus, type 2 (HCC)    Eczema    GERD (gastroesophageal reflux disease)    Glaucoma    Gout    History of kidney stones    Hyperlipidemia    Hypertension    Metabolic dysfunction-associated steatohepatitis (MASH)    PONV (postoperative nausea and vomiting)    Primary biliary cholangitis (HCC)    Sarcoidosis    Sleep apnea    Thyroid cancer (HCC)    Past Surgical History:  Procedure Laterality Date   CATARACT EXTRACTION Bilateral    COLONOSCOPY     GASTRIC BYPASS     KNEE SURGERY Left 2017   LIVER BIOPSY  01/2022   Novosure     TOTAL KNEE REVISION Left 10/15/2023   Procedure: REVISION LEFT PARTIAL KNEE to TOTAL KNEE ARTHROPLASTY;  Surgeon: Tarry Kos, MD;  Location: [facility] OR;  Service: Orthopedics;  Laterality: Left;   TUBAL LIGATION     Patient Active Problem List   Diagnosis Date Noted   Primary osteoarthritis of left knee 10/15/2023   Status post total left knee replacement 10/15/2023   Type 2 diabetes mellitus with hyperglycemia, without long-term current use of insulin (HCC) 02/20/2023   Aortic atherosclerosis (HCC) 02/20/2023   Primary biliary cholangitis (HCC) 10/06/2022   Kidney stone 06/30/2022   Encounter for health maintenance examination in adult 06/30/2022   History of gastric bypass 06/30/2022   Anemia  06/30/2022   Altered mental status 06/30/2022   Nausea and vomiting 06/30/2022   Other constipation 02/14/2022   Liver hematoma 02/05/2022   Elevated LFTs 01/12/2022   Allergic rhinitis due to pollen 09/12/2021   Pain in joints of both feet 09/12/2021   OSA (obstructive sleep apnea) 09/12/2021   Stage 3b chronic kidney disease (HCC) 07/18/2021   Elevated alkaline phosphatase level 07/18/2021   Elevated liver enzymes 07/18/2021   Chronic neck pain 07/13/2021   Chronic back pain 07/13/2021   Urge incontinence of urine 07/13/2021   Incontinence of feces 07/13/2021   Eczema 07/13/2021   Paresthesia of arm 07/13/2021   Status post left partial knee replacement 07/13/2021   History of thyroid cancer 07/13/2021   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   Hyperlipidemia 07/13/2021   Essential hypertension, benign 07/13/2021   Elevated uric acid in blood 07/13/2021   Insomnia 07/13/2021   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     REFERRING PROVIDER: Gershon Mussel, MD  REFERRING DIAG:  Z96.652 (ICD-10-CM) - H/O total knee replacement, left      Rationale for Evaluation and Treatment: Rehabilitation  THERAPY DIAG:  No diagnosis found.  ONSET DATE: DOS 10/15/23   SUBJECTIVE:  SUBJECTIVE STATEMENT: The patient reports her knee is better then last visit but still a little sore.   PERTINENT HISTORY:  Partial TKA 2018  PAIN:  Are you having pain? Yes: NPRS scale: 4/10 Pain location: posterior and lateral thigh Pain description: Stiffness Aggravating factors: sitting Relieving factors: rest  PRECAUTIONS:  None  RED FLAGS: None   WEIGHT BEARING RESTRICTIONS:  No  FALLS:  Has patient fallen in last 6 months? No  LIVING ENVIRONMENT: 14 steps at home  OCCUPATION:  Not working  PLOF:   Independent  PATIENT GOALS:  Stairs, walking, getting ready to move in June, play with grand children.    OBJECTIVE:  Note: Objective measures were completed at Evaluation unless otherwise noted.  PATIENT SURVEYS:  FOTO 41  COGNITIVE STATUS: Within functional limits for tasks assessed   EDEMA:  Yes: Lt knee  GAIT: Eval: with RW, lacking full knee ext at heel strike   Body Part #1 Knee  PALPATION: Spongy end feel  LOWER EXTREMITY ROM:     Passive  Left eval Lt  12/14 Left  Left 1/15  Hip flexion      Hip extension      Hip abduction      Hip adduction      Hip internal rotation      Hip external rotation      Knee flexion 100 114 115 107   Knee extension 0  4    (Blank rows = not tested)       TREATMENT:                                                                                                                              1/15 Manual  PROM L knee Tib/fem mobilizations posterior glide grade II-III Patella mobilizations  SLR 3x10  Bridges 2x10   Heel/toe x20   Step up 4 inches 2x10  Lateral step up 4 inch x10     1/9  Nu-step: 5 min L5  PROM L knee STM to lateral quads Patella mobilizations  Supine:  SLR 3x10  Bridges 2x10   Seated:  LAQ 3#- stopped after 5 reps due to lateral knee discomfort Long sit HSS 30sec x3      Standing:  Standing marches  Standing calf stretch 30seconds x3  12/30  Nu-step: 5 min L5  PROM L knee Tib/fem mobilizations posterior glide grade II-III Patella mobilizations  Supine:  SLR 3x10  Bridges 2x10   Seated:  LAQ 3x10 2.5# Seated HSC RTB 2x10   Standing: Step up 6 inch 2x10  HR/TR  on airex 2x10  Squats at rail 2x10 Marching on air-ex 2x10  SLS trials 5-15 seconds Standing hip abduction/extension 2.5 pound weights 2 x 15 each  12/26  Nu-step: 5 min L3   Manual: PROM into flexion and extension. STM to hamstring. Review of self streth for flexion and extension   Supine:   SLR 3x10  Bridges 2x10  Seated:  LAQ 2x10    Standing: Step up 6 inch 2x10  HR/TR 2x10  Marching on air-ex 2x10  Standing hip abduction/extension 2 pound weights 2 x 10 each     Treatment                            12/17:  PROM SLR 2x10 Bridges 2x10 Squats 2 x 10 LAQ 2 pound ankle weight 3-second hold 2 x 10 Step ups 6 inch step 2 x 10 Eccentric forward step downs forward step 2 x 10 Standing hip abduction/extension 2 pound weights 2 x 10 each HR/TR 2 x 10 Marching on Airex 2 x 10 Gait in hall x 1 lap   Treatment                            12/14:  NuStep level 3 for 4 minutes, level 5 for 1 minute Step and return over hurdle Gait training-cues for pressure over second digit and knee flexion in swing through Heel raises Gastroc stretch Single-leg stance with glutes set Straight leg raise without touching the table in between reps, straight leg raise circles Supine bridges at endrange knee flexion Side-lying arcs Stair training-glutes set to ascend, controlled eccentric quad to descend  PATIENT EDUCATION:  Education details: Teacher, music of condition, POC, HEP, exercise form/rationale Person educated: Spouse Education method: Programmer, multimedia, Facilities manager, Actor cues, Verbal cues, and Handouts Education comprehension: verbalized understanding, returned demonstration, verbal cues required, tactile cues required, and needs further education  HOME EXERCISE PROGRAM:  [email or web address]  Access Code: [email or web address]   ASSESSMENT:  CLINICAL IMPRESSION: The patient tolerated treatment well today. She had no significant pain. We kept her with her basic exercises 2nd to pain last visit. We also worked on Museum/gallery curator today. She had no significant pain. She reports she has had the most pain going down steps. We will start working on going down the stairs over the next few visits.    REHAB POTENTIAL: Good  CLINICAL DECISION MAKING: Stable/uncomplicated  EVALUATION  COMPLEXITY: Low   GOALS: Goals reviewed with patient? Yes  SHORT TERM GOALS: Target date: 12/01/23  ROM 0-120 Baseline: Goal status: 4-115 12/26  2.  Full knee extension at heel strike in gait Baseline:  Goal status: continues to work on extension 12/27  3.  Able to ambulate a few steps without antalgic pattern, without AD Baseline:  Goal status: INITIAL    LONG TERM GOALS: Target date: POC date   Meet FOTO goal Baseline:  Goal status: INITIAL  2.  Perform stairs step over step, ascending and descending Baseline:  Goal status: INITIAL  3.  Average pain <=2/10 Baseline:  Goal status: INITIAL  4.  Return to gym program at [facility] Baseline:  Goal status: INITIAL    PLAN:  PT FREQUENCY: 1-2x/week  PT DURATION: 12 weeks  PLANNED INTERVENTIONS: 97164- PT Re-evaluation, 97110-Therapeutic exercises, 97530- Therapeutic activity, 97112- Neuromuscular re-education, 97535- Self Care, 62952- Manual therapy, [phone]- Gait training, [phone]- Aquatic Therapy, [phone]- Vasopneumatic device, Patient/Family education, Balance training, Stair training, Taping, Dry Needling, Joint mobilization, Spinal mobilization, Scar mobilization, Cryotherapy, and Moist heat.  PLAN FOR NEXT SESSION: ROM, quad activation  Riki Altes, PTA  12/26/23 3:29 PM

## 2023-12-27 ENCOUNTER — Encounter (HOSPITAL_BASED_OUTPATIENT_CLINIC_OR_DEPARTMENT_OTHER): Payer: Self-pay | Admitting: Physical Therapy

## 2023-12-31 ENCOUNTER — Encounter (HOSPITAL_BASED_OUTPATIENT_CLINIC_OR_DEPARTMENT_OTHER): Payer: Self-pay | Admitting: Physical Therapy

## 2023-12-31 ENCOUNTER — Ambulatory Visit (HOSPITAL_BASED_OUTPATIENT_CLINIC_OR_DEPARTMENT_OTHER): Payer: Managed Care, Other (non HMO) | Admitting: Physical Therapy

## 2023-12-31 DIAGNOSIS — R262 Difficulty in walking, not elsewhere classified: Secondary | ICD-10-CM

## 2023-12-31 DIAGNOSIS — M25562 Pain in left knee: Secondary | ICD-10-CM | POA: Diagnosis not present

## 2023-12-31 DIAGNOSIS — M25662 Stiffness of left knee, not elsewhere classified: Secondary | ICD-10-CM

## 2023-12-31 NOTE — Therapy (Signed)
OUTPATIENT PHYSICAL THERAPY TREATMENT   Patient Name: Dyesha Adames MRN: [id number] DOB:1961-08-09, 63 y.o., female Today's Date: 01/01/2024  END OF SESSION:  PT End of Session - 12/31/23 1359     Visit Number 8    Number of Visits 25    Date for PT Re-Evaluation 02/09/24    Authorization Type [employer]    PT Start Time 1400    PT Stop Time 1440    PT Time Calculation (min) 40 min    Activity Tolerance Patient tolerated treatment well    Behavior During Therapy WFL for tasks assessed/performed                  Past Medical History:  Diagnosis Date   Anemia    CKD stage 3b, GFR 30-44 ml/min (HCC)    Complication of anesthesia    Diabetes mellitus, type 2 (HCC)    Eczema    GERD (gastroesophageal reflux disease)    Glaucoma    Gout    History of kidney stones    Hyperlipidemia    Hypertension    Metabolic dysfunction-associated steatohepatitis (MASH)    PONV (postoperative nausea and vomiting)    Primary biliary cholangitis (HCC)    Sarcoidosis    Sleep apnea    Thyroid cancer (HCC)    Past Surgical History:  Procedure Laterality Date   CATARACT EXTRACTION Bilateral    COLONOSCOPY     GASTRIC BYPASS     KNEE SURGERY Left 2017   LIVER BIOPSY  01/2022   Novosure     TOTAL KNEE REVISION Left 10/15/2023   Procedure: REVISION LEFT PARTIAL KNEE to TOTAL KNEE ARTHROPLASTY;  Surgeon: Tarry Kos, MD;  Location: [facility] OR;  Service: Orthopedics;  Laterality: Left;   TUBAL LIGATION     Patient Active Problem List   Diagnosis Date Noted   Primary osteoarthritis of left knee 10/15/2023   Status post total left knee replacement 10/15/2023   Type 2 diabetes mellitus with hyperglycemia, without long-term current use of insulin (HCC) 02/20/2023   Aortic atherosclerosis (HCC) 02/20/2023   Primary biliary cholangitis (HCC) 10/06/2022   Kidney stone 06/30/2022   Encounter for health maintenance examination in adult 06/30/2022   History of gastric bypass 06/30/2022   Anemia  06/30/2022   Altered mental status 06/30/2022   Nausea and vomiting 06/30/2022   Other constipation 02/14/2022   Liver hematoma 02/05/2022   Elevated LFTs 01/12/2022   Allergic rhinitis due to pollen 09/12/2021   Pain in joints of both feet 09/12/2021   OSA (obstructive sleep apnea) 09/12/2021   Stage 3b chronic kidney disease (HCC) 07/18/2021   Elevated alkaline phosphatase level 07/18/2021   Elevated liver enzymes 07/18/2021   Chronic neck pain 07/13/2021   Chronic back pain 07/13/2021   Urge incontinence of urine 07/13/2021   Incontinence of feces 07/13/2021   Eczema 07/13/2021   Paresthesia of arm 07/13/2021   Status post left partial knee replacement 07/13/2021   History of thyroid cancer 07/13/2021   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   Hyperlipidemia 07/13/2021   Essential hypertension, benign 07/13/2021   Elevated uric acid in blood 07/13/2021   Insomnia 07/13/2021   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     REFERRING PROVIDER: Gershon Mussel, MD  REFERRING DIAG:  Z96.652 (ICD-10-CM) - H/O total knee replacement, left      Rationale for Evaluation and Treatment: Rehabilitation  THERAPY DIAG:  Acute pain of left knee  Stiffness of left knee, not elsewhere classified  Difficulty in  walking, not elsewhere classified  ONSET DATE: DOS 10/15/23   SUBJECTIVE:                                                                                                                                                                                           SUBJECTIVE STATEMENT: Just cannot get rid of this swelling in the back of the knee. A little tingling at incision.   PERTINENT HISTORY:  Partial TKA 2018  PAIN:  Are you having pain? Yes: NPRS scale: 4/10 Pain location: posterior and lateral thigh Pain description: Stiffness Aggravating factors: sitting Relieving factors: rest  PRECAUTIONS:  None  RED FLAGS: None   WEIGHT BEARING RESTRICTIONS:  No  FALLS:   Has patient fallen in last 6 months? No  LIVING ENVIRONMENT: 14 steps at home  OCCUPATION:  Not working  PLOF:  Independent  PATIENT GOALS:  Stairs, walking, getting ready to move in June, play with grand children.    OBJECTIVE:  Note: Objective measures were completed at Evaluation unless otherwise noted.  PATIENT SURVEYS:  FOTO 41  COGNITIVE STATUS: Within functional limits for tasks assessed   EDEMA:  Yes: Lt knee  GAIT: Eval: with RW, lacking full knee ext at heel strike   Body Part #1 Knee  PALPATION: Spongy end feel  LOWER EXTREMITY ROM:     Passive  Left eval Lt  12/14 Left  Left 1/15  Hip flexion      Hip extension      Hip abduction      Hip adduction      Hip internal rotation      Hip external rotation      Knee flexion 100 114 115 107   Knee extension 0  4    (Blank rows = not tested)       TREATMENT:                                                                                                                              Treatment  1/20:  Nu step L5 LE only 5 min IASTM lateral HS- ROM improved from 108 to 115 deg flexion Supine active hamstring stretch Straight leg lower- to 90- extend to ceiling  STM to hamstrings - passive flexion to 116 following SLS Golfer hinge Single leg eccentric step down- small range Standing gastroc stretch   1/15 Manual  PROM L knee Tib/fem mobilizations posterior glide grade II-III Patella mobilizations  SLR 3x10  Bridges 2x10   Heel/toe x20   Step up 4 inches 2x10  Lateral step up 4 inch x10     1/9  Nu-step: 5 min L5  PROM L knee STM to lateral quads Patella mobilizations  Supine:  SLR 3x10  Bridges 2x10   Seated:  LAQ 3#- stopped after 5 reps due to lateral knee discomfort Long sit HSS 30sec x3      Standing:  Standing marches  Standing calf stretch 30seconds x3   PATIENT EDUCATION:  Education details: Teacher, music of condition, POC,  HEP, exercise form/rationale Person educated: Spouse Education method: Programmer, multimedia, Facilities manager, Actor cues, Verbal cues, and Handouts Education comprehension: verbalized understanding, returned demonstration, verbal cues required, tactile cues required, and needs further education  HOME EXERCISE PROGRAM:  [email or web address]  Access Code: [email or web address]   ASSESSMENT:  CLINICAL IMPRESSION: Spasm in hamstring and gastroc decreasing ability to flex knee and trapping edema in the posterior aspect of the knee joint.  Objective motions documented in treatment notes demonstrate improved ability to perform flexion range of motion as hamstrings are decreased in tightness.  Eccentric hamstring motions in order to encourage lengthening.   REHAB POTENTIAL: Good  CLINICAL DECISION MAKING: Stable/uncomplicated  EVALUATION COMPLEXITY: Low   GOALS: Goals reviewed with patient? Yes  SHORT TERM GOALS: Target date: 12/01/23  ROM 0-120 Baseline: Goal status: 0-116 on 1/21  2.  Full knee extension at heel strike in gait Baseline:  Goal status: Achieved  3.  Able to ambulate a few steps without antalgic pattern, without AD Baseline:  Goal status: Achieved    LONG TERM GOALS: Target date: POC date   Meet FOTO goal Baseline:  Goal status: INITIAL  2.  Perform stairs step over step, ascending and descending Baseline:  Goal status: INITIAL  3.  Average pain <=2/10 Baseline:  Goal status: INITIAL  4.  Return to gym program at [facility] Baseline:  Goal status: INITIAL    PLAN:  PT FREQUENCY: 1-2x/week  PT DURATION: 12 weeks  PLANNED INTERVENTIONS: 97164- PT Re-evaluation, 97110-Therapeutic exercises, 97530- Therapeutic activity, 97112- Neuromuscular re-education, 97535- Self Care, 36644- Manual therapy, [phone]- Gait training, [phone]- Aquatic Therapy, [phone]- Vasopneumatic device, Patient/Family education, Balance training, Stair training, Taping, Dry Needling, Joint  mobilization, Spinal mobilization, Scar mobilization, Cryotherapy, and Moist heat.  PLAN FOR NEXT SESSION: ROM, glut activation and eccentric hamstrings  Letanya Froh C. Ahmya Bernick PT, DPT 01/01/24 7:40 AM

## 2024-01-08 ENCOUNTER — Encounter: Payer: Self-pay | Admitting: Physician Assistant

## 2024-01-08 ENCOUNTER — Ambulatory Visit (INDEPENDENT_AMBULATORY_CARE_PROVIDER_SITE_OTHER): Payer: Managed Care, Other (non HMO) | Admitting: Physician Assistant

## 2024-01-08 DIAGNOSIS — Z96652 Presence of left artificial knee joint: Secondary | ICD-10-CM

## 2024-01-08 NOTE — Progress Notes (Signed)
Post-Op Visit Note   Patient: Lauren [name]           Date of Birth: 03/02/1961           MRN: [id number] Visit Date: 01/08/2024 PCP: Jac Canavan, PA-C   Assessment & Plan:  Chief Complaint:  Chief Complaint  Patient presents with   Left Knee - Follow-up    Left total knee arthroplasty 10/15/2023   Visit Diagnoses:  1. Status post total left knee replacement     Plan: Patient is a pleasant 63 year old female who comes in today 3 months status post left total knee replacement 10/15/2023.  She has been doing relatively well but still complains of some pain and swelling to the posterior knee.  She is in physical therapy and has been making improvements.  Examination of the left knee reveals a fully healed surgical scar without complication.  Range of motion 0 to 110 degrees.  She is stable to valgus varus stress.  She does have a small effusion but nothing worrisome.  Calf is soft and nontender.  She is neurovascularly intact distally.  At this point, she will continue with PT.  She tells me she starts water aerobics soon.  Dental prophylaxis reinforced.  Follow-up in 3 months for repeat evaluation and 2 view x-rays of the left knee.  Call with concerns or questions.  Follow-Up Instructions: Return in about 6 weeks (around 02/19/2024).   Orders:  No orders of the defined types were placed in this encounter.  No orders of the defined types were placed in this encounter.   Imaging: No new imaging  PMFS History: Patient Active Problem List   Diagnosis Date Noted   Primary osteoarthritis of left knee 10/15/2023   Status post total left knee replacement 10/15/2023   Type 2 diabetes mellitus with hyperglycemia, without long-term current use of insulin (HCC) 02/20/2023   Aortic atherosclerosis (HCC) 02/20/2023   Primary biliary cholangitis (HCC) 10/06/2022   Kidney stone 06/30/2022   Encounter for health maintenance examination in adult 06/30/2022   History of gastric bypass  06/30/2022   Anemia 06/30/2022   Altered mental status 06/30/2022   Nausea and vomiting 06/30/2022   Other constipation 02/14/2022   Liver hematoma 02/05/2022   Elevated LFTs 01/12/2022   Allergic rhinitis due to pollen 09/12/2021   Pain in joints of both feet 09/12/2021   OSA (obstructive sleep apnea) 09/12/2021   Stage 3b chronic kidney disease (HCC) 07/18/2021   Elevated alkaline phosphatase level 07/18/2021   Elevated liver enzymes 07/18/2021   Chronic neck pain 07/13/2021   Chronic back pain 07/13/2021   Urge incontinence of urine 07/13/2021   Incontinence of feces 07/13/2021   Eczema 07/13/2021   Paresthesia of arm 07/13/2021   Status post left partial knee replacement 07/13/2021   History of thyroid cancer 07/13/2021   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   Hyperlipidemia 07/13/2021   Essential hypertension, benign 07/13/2021   Elevated uric acid in blood 07/13/2021   Insomnia 07/13/2021   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   Past Medical History:  Diagnosis Date   Anemia    CKD stage 3b, GFR 30-44 ml/min (HCC)    Complication of anesthesia    Diabetes mellitus, type 2 (HCC)    Eczema    GERD (gastroesophageal reflux disease)    Glaucoma    Gout    History of kidney stones    Hyperlipidemia    Hypertension    Metabolic dysfunction-associated steatohepatitis (MASH)    PONV (  postoperative nausea and vomiting)    Primary biliary cholangitis (HCC)    Sarcoidosis    Sleep apnea    Thyroid cancer (HCC)     Family History  Problem Relation Age of Onset   Breast cancer Mother    Breast cancer Maternal Grandmother    Diabetes Mellitus II Paternal Grandfather    Throat cancer Paternal Grandfather    Stomach cancer Neg Hx    Esophageal cancer Neg Hx    Pancreatic cancer Neg Hx    Colon cancer Neg Hx     Past Surgical History:  Procedure Laterality Date   CATARACT EXTRACTION Bilateral    COLONOSCOPY     GASTRIC BYPASS     KNEE SURGERY Left 2017    LIVER BIOPSY  01/2022   Novosure     TOTAL KNEE REVISION Left 10/15/2023   Procedure: REVISION LEFT PARTIAL KNEE to TOTAL KNEE ARTHROPLASTY;  Surgeon: Tarry Kos, MD;  Location: [facility] OR;  Service: Orthopedics;  Laterality: Left;   TUBAL LIGATION     Social History   Occupational History   Not on file  Tobacco Use   Smoking status: Former    Current packs/day: 0.00    Average packs/day: 1.3 packs/day for 22.0 years (27.5 ttl pk-yrs)    Types: E-cigarettes, Cigarettes    Start date: 37    Quit date: 2002    Years since quitting: 23.0   Smokeless tobacco: Never   Tobacco comments:    Stopped smoking 27yrs ago  Vaping Use   Vaping status: Never Used  Substance and Sexual Activity   Alcohol use: Yes    Comment: Occ.   Drug use: Not Currently    Comment: gummies   Sexual activity: Yes

## 2024-01-09 ENCOUNTER — Ambulatory Visit (HOSPITAL_BASED_OUTPATIENT_CLINIC_OR_DEPARTMENT_OTHER): Payer: Managed Care, Other (non HMO) | Admitting: Physical Therapy

## 2024-01-09 ENCOUNTER — Encounter (HOSPITAL_BASED_OUTPATIENT_CLINIC_OR_DEPARTMENT_OTHER): Payer: Self-pay | Admitting: Physical Therapy

## 2024-01-09 DIAGNOSIS — M25562 Pain in left knee: Secondary | ICD-10-CM

## 2024-01-09 DIAGNOSIS — R262 Difficulty in walking, not elsewhere classified: Secondary | ICD-10-CM

## 2024-01-09 DIAGNOSIS — M25662 Stiffness of left knee, not elsewhere classified: Secondary | ICD-10-CM

## 2024-01-09 NOTE — Therapy (Signed)
OUTPATIENT PHYSICAL THERAPY TREATMENT   Patient Name: Lauren [name] MRN: [id number] DOB:07-Nov-1961, 63 y.o., female Today's Date: 01/09/2024  END OF SESSION:  PT End of Session - 01/09/24 1522     Visit Number 9    Number of Visits 25    Date for PT Re-Evaluation 02/09/24    Authorization Type [employer]    PT Start Time 1518    PT Stop Time 1559    PT Time Calculation (min) 41 min    Activity Tolerance Patient tolerated treatment well    Behavior During Therapy WFL for tasks assessed/performed                  Past Medical History:  Diagnosis Date   Anemia    CKD stage 3b, GFR 30-44 ml/min (HCC)    Complication of anesthesia    Diabetes mellitus, type 2 (HCC)    Eczema    GERD (gastroesophageal reflux disease)    Glaucoma    Gout    History of kidney stones    Hyperlipidemia    Hypertension    Metabolic dysfunction-associated steatohepatitis (MASH)    PONV (postoperative nausea and vomiting)    Primary biliary cholangitis (HCC)    Sarcoidosis    Sleep apnea    Thyroid cancer (HCC)    Past Surgical History:  Procedure Laterality Date   CATARACT EXTRACTION Bilateral    COLONOSCOPY     GASTRIC BYPASS     KNEE SURGERY Left 2017   LIVER BIOPSY  01/2022   Novosure     TOTAL KNEE REVISION Left 10/15/2023   Procedure: REVISION LEFT PARTIAL KNEE to TOTAL KNEE ARTHROPLASTY;  Surgeon: Tarry Kos, MD;  Location: [facility] OR;  Service: Orthopedics;  Laterality: Left;   TUBAL LIGATION     Patient Active Problem List   Diagnosis Date Noted   Primary osteoarthritis of left knee 10/15/2023   Status post total left knee replacement 10/15/2023   Type 2 diabetes mellitus with hyperglycemia, without long-term current use of insulin (HCC) 02/20/2023   Aortic atherosclerosis (HCC) 02/20/2023   Primary biliary cholangitis (HCC) 10/06/2022   Kidney stone 06/30/2022   Encounter for health maintenance examination in adult 06/30/2022   History of gastric bypass 06/30/2022   Anemia  06/30/2022   Altered mental status 06/30/2022   Nausea and vomiting 06/30/2022   Other constipation 02/14/2022   Liver hematoma 02/05/2022   Elevated LFTs 01/12/2022   Allergic rhinitis due to pollen 09/12/2021   Pain in joints of both feet 09/12/2021   OSA (obstructive sleep apnea) 09/12/2021   Stage 3b chronic kidney disease (HCC) 07/18/2021   Elevated alkaline phosphatase level 07/18/2021   Elevated liver enzymes 07/18/2021   Chronic neck pain 07/13/2021   Chronic back pain 07/13/2021   Urge incontinence of urine 07/13/2021   Incontinence of feces 07/13/2021   Eczema 07/13/2021   Paresthesia of arm 07/13/2021   Status post left partial knee replacement 07/13/2021   History of thyroid cancer 07/13/2021   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   Hyperlipidemia 07/13/2021   Essential hypertension, benign 07/13/2021   Elevated uric acid in blood 07/13/2021   Insomnia 07/13/2021   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     REFERRING PROVIDER: Gershon Mussel, MD  REFERRING DIAG:  Z96.652 (ICD-10-CM) - H/O total knee replacement, left      Rationale for Evaluation and Treatment: Rehabilitation  THERAPY DIAG:  Acute pain of left knee  Stiffness of left knee, not elsewhere classified  Difficulty in  walking, not elsewhere classified  ONSET DATE: DOS 10/15/23  Days since surgery: 86   SUBJECTIVE:                                                                                                                                                                                           SUBJECTIVE STATEMENT: The patient has been to the PA. She is happy with her progress. The patient continues to have posterior swelling. She was able to ride to [place] nad back without significant issue.   PERTINENT HISTORY:  Partial TKA 2018  PAIN:  Are you having pain? Yes: NPRS scale: 4/10 Pain location: posterior and lateral thigh Pain description: Stiffness Aggravating factors:  sitting Relieving factors: rest  PRECAUTIONS:  None  RED FLAGS: None   WEIGHT BEARING RESTRICTIONS:  No  FALLS:  Has patient fallen in last 6 months? No  LIVING ENVIRONMENT: 14 steps at home  OCCUPATION:  Not working  PLOF:  Independent  PATIENT GOALS:  Stairs, walking, getting ready to move in June, play with grand children.    OBJECTIVE:  Note: Objective measures were completed at Evaluation unless otherwise noted.  PATIENT SURVEYS:  FOTO 41  COGNITIVE STATUS: Within functional limits for tasks assessed   EDEMA:  Yes: Lt knee  GAIT: Eval: with RW, lacking full knee ext at heel strike   Body Part #1 Knee  PALPATION: Spongy end feel  LOWER EXTREMITY ROM:     Passive  Left eval Lt  12/14 Left  Left 1/15   Hip flexion       Hip extension       Hip abduction       Hip adduction       Hip internal rotation       Hip external rotation       Knee flexion 100 114 115 107    Knee extension 0  4     (Blank rows = not tested)       TREATMENT:  1/29  Manual  PROM L knee Tib/fem mobilizations posterior glide grade II-III Patella mobilizations  SLR 3x10  Bridges 2x10  SAQ 3x12 0 lbs and 1.5 lbs and 2lbs   LAQ 1.5 lbs 1x12 2x132 2 lbs   Heel/toe x20   Step up 4 inches 2x10    Mini squats 2x10        Treatment                            1/20:  Nu step L5 LE only 5 min IASTM lateral HS- ROM improved from 108 to 115 deg flexion Supine active hamstring stretch Straight leg lower- to 90- extend to ceiling  STM to hamstrings - passive flexion to 116 following SLS Golfer hinge Single leg eccentric step down- small range Standing gastroc stretch   1/15 Manual  PROM L knee Tib/fem mobilizations posterior glide grade II-III Patella mobilizations  SLR 3x10  Bridges 2x10   Heel/toe x20   Step up 4  inches 2x10  Lateral step up 4 inch x10     1/9  Nu-step: 5 min L5  PROM L knee STM to lateral quads Patella mobilizations  Supine:  SLR 3x10  Bridges 2x10   Seated:  LAQ 3#- stopped after 5 reps due to lateral knee discomfort Long sit HSS 30sec x3      Standing:  Standing marches  Standing calf stretch 30seconds x3   PATIENT EDUCATION:  Education details: Teacher, music of condition, POC, HEP, exercise form/rationale Person educated: Spouse Education method: Programmer, multimedia, Facilities manager, Actor cues, Verbal cues, and Handouts Education comprehension: verbalized understanding, returned demonstration, verbal cues required, tactile cues required, and needs further education  HOME EXERCISE PROGRAM:  [email or web address]  Access Code: [email or web address]   ASSESSMENT:  CLINICAL IMPRESSION: Total arc of motion measured at 2-117. She is doing well. We worked on squats today. She had good technique with squats. She is tolerating steps well. Therapy will continue to progress as tolerated.    REHAB POTENTIAL: Good  CLINICAL DECISION MAKING: Stable/uncomplicated  EVALUATION COMPLEXITY: Low   GOALS: Goals reviewed with patient? Yes  SHORT TERM GOALS: Target date: 12/01/23  ROM 0-120 Baseline: Goal status: 0-116 on 1/21  2.  Full knee extension at heel strike in gait Baseline:  Goal status: Achieved  3.  Able to ambulate a few steps without antalgic pattern, without AD Baseline:  Goal status: Achieved    LONG TERM GOALS: Target date: POC date   Meet FOTO goal Baseline:  Goal status: INITIAL  2.  Perform stairs step over step, ascending and descending Baseline:  Goal status: INITIAL  3.  Average pain <=2/10 Baseline:  Goal status: INITIAL  4.  Return to gym program at [facility] Baseline:  Goal status: INITIAL    PLAN:  PT FREQUENCY: 1-2x/week  PT DURATION: 12 weeks  PLANNED INTERVENTIONS: 97164- PT Re-evaluation, 97110-Therapeutic exercises,  97530- Therapeutic activity, 97112- Neuromuscular re-education, 97535- Self Care, 08657- Manual therapy, [phone]- Gait training, [phone]- Aquatic Therapy, [phone]- Vasopneumatic device, Patient/Family education, Balance training, Stair training, Taping, Dry Needling, Joint mobilization, Spinal mobilization, Scar mobilization, Cryotherapy, and Moist heat.  PLAN FOR NEXT SESSION: ROM, glut activation and eccentric hamstrings  Jessica C. Hightower PT, DPT 01/09/24 3:24 PM

## 2024-01-11 ENCOUNTER — Encounter (HOSPITAL_BASED_OUTPATIENT_CLINIC_OR_DEPARTMENT_OTHER): Payer: Self-pay | Admitting: Physical Therapy

## 2024-01-11 ENCOUNTER — Ambulatory Visit (HOSPITAL_BASED_OUTPATIENT_CLINIC_OR_DEPARTMENT_OTHER): Payer: Managed Care, Other (non HMO) | Admitting: Physical Therapy

## 2024-01-11 DIAGNOSIS — M25562 Pain in left knee: Secondary | ICD-10-CM | POA: Diagnosis not present

## 2024-01-11 DIAGNOSIS — M25662 Stiffness of left knee, not elsewhere classified: Secondary | ICD-10-CM

## 2024-01-11 DIAGNOSIS — R262 Difficulty in walking, not elsewhere classified: Secondary | ICD-10-CM

## 2024-01-11 NOTE — Therapy (Signed)
OUTPATIENT PHYSICAL THERAPY TREATMENT   Patient Name: Lauren [name] MRN: [id number] DOB:09-18-1961, 63 y.o., female Today's Date: 01/11/2024  END OF SESSION:  PT End of Session - 01/11/24 1301     Visit Number 10    Number of Visits 25    Date for PT Re-Evaluation 02/09/24    Authorization Type [employer]    PT Start Time 1300    PT Stop Time 1340    PT Time Calculation (min) 40 min    Behavior During Therapy WFL for tasks assessed/performed                  Past Medical History:  Diagnosis Date   Anemia    CKD stage 3b, GFR 30-44 ml/min (HCC)    Complication of anesthesia    Diabetes mellitus, type 2 (HCC)    Eczema    GERD (gastroesophageal reflux disease)    Glaucoma    Gout    History of kidney stones    Hyperlipidemia    Hypertension    Metabolic dysfunction-associated steatohepatitis (MASH)    PONV (postoperative nausea and vomiting)    Primary biliary cholangitis (HCC)    Sarcoidosis    Sleep apnea    Thyroid cancer (HCC)    Past Surgical History:  Procedure Laterality Date   CATARACT EXTRACTION Bilateral    COLONOSCOPY     GASTRIC BYPASS     KNEE SURGERY Left 2017   LIVER BIOPSY  01/2022   Novosure     TOTAL KNEE REVISION Left 10/15/2023   Procedure: REVISION LEFT PARTIAL KNEE to TOTAL KNEE ARTHROPLASTY;  Surgeon: Tarry Kos, MD;  Location: [facility] OR;  Service: Orthopedics;  Laterality: Left;   TUBAL LIGATION     Patient Active Problem List   Diagnosis Date Noted   Primary osteoarthritis of left knee 10/15/2023   Status post total left knee replacement 10/15/2023   Type 2 diabetes mellitus with hyperglycemia, without long-term current use of insulin (HCC) 02/20/2023   Aortic atherosclerosis (HCC) 02/20/2023   Primary biliary cholangitis (HCC) 10/06/2022   Kidney stone 06/30/2022   Encounter for health maintenance examination in adult 06/30/2022   History of gastric bypass 06/30/2022   Anemia 06/30/2022   Altered mental status 06/30/2022   Nausea  and vomiting 06/30/2022   Other constipation 02/14/2022   Liver hematoma 02/05/2022   Elevated LFTs 01/12/2022   Allergic rhinitis due to pollen 09/12/2021   Pain in joints of both feet 09/12/2021   OSA (obstructive sleep apnea) 09/12/2021   Stage 3b chronic kidney disease (HCC) 07/18/2021   Elevated alkaline phosphatase level 07/18/2021   Elevated liver enzymes 07/18/2021   Chronic neck pain 07/13/2021   Chronic back pain 07/13/2021   Urge incontinence of urine 07/13/2021   Incontinence of feces 07/13/2021   Eczema 07/13/2021   Paresthesia of arm 07/13/2021   Status post left partial knee replacement 07/13/2021   History of thyroid cancer 07/13/2021   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   Hyperlipidemia 07/13/2021   Essential hypertension, benign 07/13/2021   Elevated uric acid in blood 07/13/2021   Insomnia 07/13/2021   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     REFERRING PROVIDER: Gershon Mussel, MD  REFERRING DIAG:  Z96.652 (ICD-10-CM) - H/O total knee replacement, left      Rationale for Evaluation and Treatment: Rehabilitation  THERAPY DIAG:  Acute pain of left knee  Stiffness of left knee, not elsewhere classified  Difficulty in walking, not elsewhere classified  ONSET DATE: DOS 10/15/23  Days since surgery: 88   SUBJECTIVE:                                                                                                                                                                                           SUBJECTIVE STATEMENT: Pt reports increased stiffness in L knee.  Weather has affected how knee feels.  Pt has done water aerobics in the past.   POOL ACCESS: pt is member of [employer], with membership on hold while in PT.   PERTINENT HISTORY:  Partial TKA 2018  PAIN:  Are you having pain? Yes: NPRS scale: 2/10 Pain location: L knee Pain description: Stiffness Aggravating factors: sitting Relieving factors: rest  PRECAUTIONS:  None  RED  FLAGS: None   WEIGHT BEARING RESTRICTIONS:  No  FALLS:  Has patient fallen in last 6 months? No  LIVING ENVIRONMENT: 14 steps at home  OCCUPATION:  Not working  PLOF:  Independent  PATIENT GOALS:  Stairs, walking, getting ready to move in June, play with grand children.    OBJECTIVE:  Note: Objective measures were completed at Evaluation unless otherwise noted.  PATIENT SURVEYS:  FOTO 41  COGNITIVE STATUS: Within functional limits for tasks assessed   EDEMA:  Yes: Lt knee  GAIT: Eval: with RW, lacking full knee ext at heel strike   Body Part #1 Knee  PALPATION: Spongy end feel  LOWER EXTREMITY ROM:     Passive  Left eval Lt  12/14 Left  Left 1/15   Hip flexion       Hip extension       Hip abduction       Hip adduction       Hip internal rotation       Hip external rotation       Knee flexion 100 114 115 107    Knee extension 0  4     (Blank rows = not tested)       TREATMENT:  1/31 Pt seen for aquatic therapy today.  Treatment took place in water 3.5-4.75 ft in depth at the [facility] pool. Temp of water was 91.  Pt entered/exited the pool via stairs in step-through pattern independently with bilat rail.  - intro to aquatic therapy principles and properties - walking unsupported: forward/ backward -> marching for increased L knee flexion - side stepping with arm addct/ abdct with yellow hand floats, cues for decreased step length and increased step height - E on yellow hand floats:  hip abdct/ addct x 10 ->  - without UE support : L SLS -> 3 way toe taps x 20 LLE, x 5 RLE; Heel/toe raises x 10 - squats (vertical trunk) pushing yellow hand float under water x 10 - seated on bench in water: LAQ with DF, with increased speedx 10; hip abdct/ addct x 10; flutter kick - forward lunge with L foot on 2nd step x 10s  hold x 4 reps - runners step ups L x 10 - tandem gait forward/ backward   1/29  Manual  PROM L knee Tib/fem mobilizations posterior glide grade II-III Patella mobilizations  SLR 3x10  Bridges 2x10  SAQ 3x12 0 lbs and 1.5 lbs and 2lbs  LAQ 1.5 lbs 1x12 2x132 2 lbs  Heel/toe x20  Step up 4 inches 2x10  Mini squats 2x10   Treatment                            1/20:  Nu step L5 LE only 5 min IASTM lateral HS- ROM improved from 108 to 115 deg flexion Supine active hamstring stretch Straight leg lower- to 90- extend to ceiling  STM to hamstrings - passive flexion to 116 following SLS Golfer hinge Single leg eccentric step down- small range Standing gastroc stretch   1/15 Manual  PROM L knee Tib/fem mobilizations posterior glide grade II-III Patella mobilizations  SLR 3x10  Bridges 2x10   Heel/toe x20   Step up 4 inches 2x10  Lateral step up 4 inch x10     1/9  Nu-step: 5 min L5  PROM L knee STM to lateral quads Patella mobilizations  Supine:  SLR 3x10  Bridges 2x10   Seated:  LAQ 3#- stopped after 5 reps due to lateral knee discomfort Long sit HSS 30sec x3      Standing:  Standing marches  Standing calf stretch 30seconds x3   PATIENT EDUCATION:  Education details: Teacher, music of condition, POC, HEP, exercise form/rationale Person educated: Spouse Education method: Programmer, multimedia, Facilities manager, Actor cues, Verbal cues, and Handouts Education comprehension: verbalized understanding, returned demonstration, verbal cues required, tactile cues required, and needs further education  HOME EXERCISE PROGRAM:  [email or web address]  Access Code: [email or web address]   ASSESSMENT:  CLINICAL IMPRESSION: Pt demonstrates safety and independence in aquatic setting with therapist instructing from deck. Pt demonstrates confidence in setting, moving throughout all depths easily.  Pt is directed through various movement patterns and trials  in both sitting and  standing positions. Pt demonstrates decreased balance in L SLS and difficulty with attaining fig 4 position with LLE. Pt tolerated session well, with only one report of increase in L medial knee pain when side stepping ; resolved with modification of movement. Otherwise, pain was 0/10 while in pool.  Pt is making good gains towards remaining goals.     REHAB POTENTIAL: Good  CLINICAL DECISION MAKING: Stable/uncomplicated  EVALUATION COMPLEXITY: Low   GOALS: Goals reviewed with patient? Yes  SHORT TERM GOALS: Target date: 12/01/23  ROM 0-120 Baseline: Goal status: 0-116 on 1/21  2.  Full knee extension at heel strike in gait Baseline:  Goal status: Achieved  3.  Able to ambulate a few steps without antalgic pattern, without AD Baseline:  Goal status: Achieved    LONG TERM GOALS: Target date: POC date   Meet FOTO goal Baseline:  Goal status: INITIAL  2.  Perform stairs step over step, ascending and descending Baseline:  Goal status: IN PROGRESS - 01/11/24  3.  Average pain <=2/10 Baseline:  Goal status: INITIAL  4.  Return [facility] gym program [facility] Baseline:  Goal status: INITIAL    PLAN:  PT FREQUENCY: 1-2x/week  PT DURATION: 12 weeks  PLANNED INTERVENTIONS: 97164- PT Re-evaluation, 97110-Therapeutic exercises, 97530- Therapeutic activity, 97112- Neuromuscular re-education, 97535- Self Care, 16109- Manual therapy, [phone]- Gait training, [phone]- Aquatic Therapy, [phone]- Vasopneumatic device, Patient/Family education, Balance training, Stair training, Taping, Dry Needling, Joint mobilization, Spinal mobilization, Scar mobilization, Cryotherapy, and Moist heat.  PLAN FOR NEXT SESSION: ROM, glut activation and eccentric hamstrings   Mayer Camel, PTA 01/11/24 1:45 PM [facility] [address] [place], [place], [address] Phone: [phone]   Fax:  [phone]

## 2024-01-14 ENCOUNTER — Other Ambulatory Visit: Payer: Self-pay

## 2024-01-14 ENCOUNTER — Encounter: Payer: Self-pay | Admitting: Orthopaedic Surgery

## 2024-01-14 MED ORDER — AMOXICILLIN 500 MG PO TABS
ORAL_TABLET | ORAL | 2 refills | Status: AC
Start: 2024-01-14 — End: ?

## 2024-01-15 ENCOUNTER — Ambulatory Visit (HOSPITAL_BASED_OUTPATIENT_CLINIC_OR_DEPARTMENT_OTHER): Payer: Managed Care, Other (non HMO) | Attending: Physician Assistant | Admitting: Physical Therapy

## 2024-01-15 DIAGNOSIS — M25562 Pain in left knee: Secondary | ICD-10-CM | POA: Diagnosis present

## 2024-01-15 DIAGNOSIS — R262 Difficulty in walking, not elsewhere classified: Secondary | ICD-10-CM | POA: Insufficient documentation

## 2024-01-15 DIAGNOSIS — M25662 Stiffness of left knee, not elsewhere classified: Secondary | ICD-10-CM | POA: Insufficient documentation

## 2024-01-15 NOTE — Therapy (Signed)
 OUTPATIENT PHYSICAL THERAPY TREATMENT   Patient Name: Lauren [name] MRN: [id number] DOB:11/10/1961, 63 y.o., female Today's Date: 01/16/2024  END OF SESSION:  PT End of Session - 01/16/24 0838     Visit Number 11    Number of Visits 25    Date for PT Re-Evaluation 02/09/24    Authorization Type [employer]    PT Start Time 1300    PT Stop Time 1342    PT Time Calculation (min) 42 min    Activity Tolerance Patient tolerated treatment well    Behavior During Therapy WFL for tasks assessed/performed                   Past Medical History:  Diagnosis Date   Anemia    CKD stage 3b, GFR 30-44 ml/min (HCC)    Complication of anesthesia    Diabetes mellitus, type 2 (HCC)    Eczema    GERD (gastroesophageal reflux disease)    Glaucoma    Gout    History of kidney stones    Hyperlipidemia    Hypertension    Metabolic dysfunction-associated steatohepatitis (MASH)    PONV (postoperative nausea and vomiting)    Primary biliary cholangitis (HCC)    Sarcoidosis    Sleep apnea    Thyroid cancer (HCC)    Past Surgical History:  Procedure Laterality Date   CATARACT EXTRACTION Bilateral    COLONOSCOPY     GASTRIC BYPASS     KNEE SURGERY Left 2017   LIVER BIOPSY  01/2022   Novosure     TOTAL KNEE REVISION Left 10/15/2023   Procedure: REVISION LEFT PARTIAL KNEE to TOTAL KNEE ARTHROPLASTY;  Surgeon: Jerri Kay HERO, MD;  Location: [facility] OR;  Service: Orthopedics;  Laterality: Left;   TUBAL LIGATION     Patient Active Problem List   Diagnosis Date Noted   Primary osteoarthritis of left knee 10/15/2023   Status post total left knee replacement 10/15/2023   Type 2 diabetes mellitus with hyperglycemia, without long-term current use of insulin  (HCC) 02/20/2023   Aortic atherosclerosis (HCC) 02/20/2023   Primary biliary cholangitis (HCC) 10/06/2022   Kidney stone 06/30/2022   Encounter for health maintenance examination in adult 06/30/2022   History of gastric bypass 06/30/2022   Anemia  06/30/2022   Altered mental status 06/30/2022   Nausea and vomiting 06/30/2022   Other constipation 02/14/2022   Liver hematoma 02/05/2022   Elevated LFTs 01/12/2022   Allergic rhinitis due to pollen 09/12/2021   Pain in joints of both feet 09/12/2021   OSA (obstructive sleep apnea) 09/12/2021   Stage 3b chronic kidney disease (HCC) 07/18/2021   Elevated alkaline phosphatase level 07/18/2021   Elevated liver enzymes 07/18/2021   Chronic neck pain 07/13/2021   Chronic back pain 07/13/2021   Urge incontinence of urine 07/13/2021   Incontinence of feces 07/13/2021   Eczema 07/13/2021   Paresthesia of arm 07/13/2021   Status post left partial knee replacement 07/13/2021   History of thyroid cancer 07/13/2021   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   Hyperlipidemia 07/13/2021   Essential hypertension, benign 07/13/2021   Elevated uric acid in blood 07/13/2021   Insomnia 07/13/2021   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     REFERRING PROVIDER: Kay Jerri, MD  REFERRING DIAG:  Z96.652 (ICD-10-CM) - H/O total knee replacement, left      Rationale for Evaluation and Treatment: Rehabilitation  THERAPY DIAG:  Acute pain of left knee  Stiffness of left knee, not elsewhere classified  Difficulty  in walking, not elsewhere classified  ONSET DATE: DOS 10/15/23  Days since surgery: 92   SUBJECTIVE:                                                                                                                                                                                           SUBJECTIVE STATEMENT: No major complaints today.  She reports needs help her to get.   POOL ACCESS: pt is member of [employer], with membership on hold while in PT.   PERTINENT HISTORY:  Partial TKA 2018  PAIN:  Are you having pain? Yes: NPRS scale: 2/10 Pain location: L knee Pain description: Stiffness Aggravating factors: sitting Relieving factors: rest  PRECAUTIONS:  None  RED  FLAGS: None   WEIGHT BEARING RESTRICTIONS:  No  FALLS:  Has patient fallen in last 6 months? No  LIVING ENVIRONMENT: 14 steps at home  OCCUPATION:  Not working  PLOF:  Independent  PATIENT GOALS:  Stairs, walking, getting ready to move in June, play with grand children.    OBJECTIVE:  Note: Objective measures were completed at Evaluation unless otherwise noted.  PATIENT SURVEYS:  FOTO 41  COGNITIVE STATUS: Within functional limits for tasks assessed   EDEMA:  Yes: Lt knee  GAIT: Eval: with RW, lacking full knee ext at heel strike   Body Part #1 Knee  PALPATION: Spongy end feel  LOWER EXTREMITY ROM:     Passive  Left eval Lt  12/14 Left  Left 1/15 Left  2/4   Hip flexion       Hip extension       Hip abduction       Hip adduction       Hip internal rotation       Hip external rotation       Knee flexion 100 114 115 107    Knee extension 0  4     (Blank rows = not tested)       TREATMENT:  2/4 Manual  PROM L knee Tib/fem mobilizations posterior glide grade II-III Patella mobilizations  There-ex  SLR 3x10  Bridges 2x10  SAQ 3x12 2.5 lbs   LAQ  2.5 lbs 3x12     There act:  Step up 4 inches 2x10  Mini squats 2x10  Heel/toe x20    1/31 Pt seen for aquatic therapy today.  Treatment took place in water 3.5-4.75 ft in depth at the [facility] pool. Temp of water was 91.  Pt entered/exited the pool via stairs in step-through pattern independently with bilat rail.  - intro to aquatic therapy principles and properties - walking unsupported: forward/ backward -> marching for increased L knee flexion - side stepping with arm addct/ abdct with yellow hand floats, cues for decreased step length and increased step height - E on yellow hand floats:  hip abdct/ addct x 10 ->  - without UE support : L SLS  -> 3 way toe taps x 20 LLE, x 5 RLE; Heel/toe raises x 10 - squats (vertical trunk) pushing yellow hand float under water x 10 - seated on bench in water: LAQ with DF, with increased speedx 10; hip abdct/ addct x 10; flutter kick - forward lunge with L foot on 2nd step x 10s hold x 4 reps - runners step ups L x 10 - tandem gait forward/ backward   1/29  Manual  PROM L knee Tib/fem mobilizations posterior glide grade II-III Patella mobilizations  SLR 3x10  Bridges 2x10  SAQ 3x12 0 lbs and 1.5 lbs and 2lbs  LAQ 1.5 lbs 1x12 2x132 2 lbs  Heel/toe x20  Step up 4 inches 2x10  Mini squats 2x10   Treatment                            1/20:  Nu step L5 LE only 5 min IASTM lateral HS- ROM improved from 108 to 115 deg flexion Supine active hamstring stretch Straight leg lower- to 90- extend to ceiling  STM to hamstrings - passive flexion to 116 following SLS Golfer hinge Single leg eccentric step down- small range Standing gastroc stretch   1/15 Manual  PROM L knee Tib/fem mobilizations posterior glide grade II-III Patella mobilizations  SLR 3x10  Bridges 2x10   Heel/toe x20   Step up 4 inches 2x10  Lateral step up 4 inch x10     1/9  Nu-step: 5 min L5  PROM L knee STM to lateral quads Patella mobilizations  Supine:  SLR 3x10  Bridges 2x10   Seated:  LAQ 3#- stopped after 5 reps due to lateral knee discomfort Long sit HSS 30sec x3      Standing:  Standing marches  Standing calf stretch 30seconds x3   PATIENT EDUCATION:  Education details: Teacher, Music of condition, POC, HEP, exercise form/rationale Person educated: Spouse Education method: Programmer, Multimedia, Facilities Manager, Actor cues, Verbal cues, and Handouts Education comprehension: verbalized understanding, returned demonstration, verbal cues required, tactile cues required, and needs further education  HOME EXERCISE PROGRAM:  [email or web address]  Access Code:  [email or web address]   ASSESSMENT:  CLINICAL IMPRESSION: Patient continues to progress well.  She is really enjoying aquatic therapy.  Her range of motion keeps making progressive gains.  She has measured at 0 to 120 degrees this visit.  We continue to progress her functional activities such as squatting and stepping.  She is tolerating her other TherEX well.  We have been able to advance her weights with several  exercises.  Therapy will continue to progress as tolerated.   REHAB POTENTIAL: Good  CLINICAL DECISION MAKING: Stable/uncomplicated  EVALUATION COMPLEXITY: Low   GOALS: Goals reviewed with patient? Yes  SHORT TERM GOALS: Target date: 12/01/23  ROM 0-120 Baseline: Goal status: Achieved 2/4  2.  Full knee extension at heel strike in gait Baseline:  Goal status: Improved heel strike with gait achieved 2/4  3.  Able to ambulate a few steps without antalgic pattern, without AD Baseline:  Goal status: Achieved 2/4    LONG TERM GOALS: Target date: POC date   Meet FOTO goal Baseline:  Goal status: INITIAL  2.  Perform stairs step over step, ascending and descending Baseline:  Goal status: IN PROGRESS - 01/11/24  3.  Average pain <=2/10 Baseline:  Goal status: Minimal pain achieved 2/5  4.  Return to gym program at [facility] Baseline:  Goal status: INITIAL    PLAN:  PT FREQUENCY: 1-2x/week  PT DURATION: 12 weeks  PLANNED INTERVENTIONS: 97164- PT Re-evaluation, 97110-Therapeutic exercises, 97530- Therapeutic activity, 97112- Neuromuscular re-education, 97535- Self Care, 02859- Manual therapy, [phone]- Gait training, [phone]- Aquatic Therapy, [phone]- Vasopneumatic device, Patient/Family education, Balance training, Stair training, Taping, Dry Needling, Joint mobilization, Spinal mobilization, Scar mobilization, Cryotherapy, and Moist heat.  PLAN FOR NEXT SESSION: ROM, glut activation and eccentric hamstrings   Delon Aquas, PTA 01/16/24 8:39 AM [facility] [address] [place], [place], [address] Phone: [phone]   Fax:  [phone]

## 2024-01-18 ENCOUNTER — Encounter (HOSPITAL_BASED_OUTPATIENT_CLINIC_OR_DEPARTMENT_OTHER): Payer: Self-pay | Admitting: Physical Therapy

## 2024-01-18 ENCOUNTER — Ambulatory Visit (HOSPITAL_BASED_OUTPATIENT_CLINIC_OR_DEPARTMENT_OTHER): Payer: Managed Care, Other (non HMO) | Admitting: Physical Therapy

## 2024-01-18 DIAGNOSIS — M25662 Stiffness of left knee, not elsewhere classified: Secondary | ICD-10-CM

## 2024-01-18 DIAGNOSIS — R262 Difficulty in walking, not elsewhere classified: Secondary | ICD-10-CM

## 2024-01-18 DIAGNOSIS — M25562 Pain in left knee: Secondary | ICD-10-CM

## 2024-01-18 NOTE — Therapy (Signed)
 OUTPATIENT PHYSICAL THERAPY TREATMENT   Patient Name: Lauren [name] MRN: [id number] DOB:1961/10/19, 63 y.o., female Today's Date: 01/18/2024  END OF SESSION:  PT End of Session - 01/18/24 1310     Visit Number 12    Number of Visits 25    Date for PT Re-Evaluation 02/09/24    Authorization Type [employer]    PT Start Time 1302    PT Stop Time 1340    PT Time Calculation (min) 38 min    Behavior During Therapy WFL for tasks assessed/performed            Past Medical History:  Diagnosis Date   Anemia    CKD stage 3b, GFR 30-44 ml/min (HCC)    Complication of anesthesia    Diabetes mellitus, type 2 (HCC)    Eczema    GERD (gastroesophageal reflux disease)    Glaucoma    Gout    History of kidney stones    Hyperlipidemia    Hypertension    Metabolic dysfunction-associated steatohepatitis (MASH)    PONV (postoperative nausea and vomiting)    Primary biliary cholangitis (HCC)    Sarcoidosis    Sleep apnea    Thyroid cancer (HCC)    Past Surgical History:  Procedure Laterality Date   CATARACT EXTRACTION Bilateral    COLONOSCOPY     GASTRIC BYPASS     KNEE SURGERY Left 2017   LIVER BIOPSY  01/2022   Novosure     TOTAL KNEE REVISION Left 10/15/2023   Procedure: REVISION LEFT PARTIAL KNEE to TOTAL KNEE ARTHROPLASTY;  Surgeon: Jerri Kay HERO, MD;  Location: [facility] OR;  Service: Orthopedics;  Laterality: Left;   TUBAL LIGATION     Patient Active Problem List   Diagnosis Date Noted   Primary osteoarthritis of left knee 10/15/2023   Status post total left knee replacement 10/15/2023   Type 2 diabetes mellitus with hyperglycemia, without long-term current use of insulin  (HCC) 02/20/2023   Aortic atherosclerosis (HCC) 02/20/2023   Primary biliary cholangitis (HCC) 10/06/2022   Kidney stone 06/30/2022   Encounter for health maintenance examination in adult 06/30/2022   History of gastric bypass 06/30/2022   Anemia 06/30/2022   Altered mental status 06/30/2022   Nausea and vomiting  06/30/2022   Other constipation 02/14/2022   Liver hematoma 02/05/2022   Elevated LFTs 01/12/2022   Allergic rhinitis due to pollen 09/12/2021   Pain in joints of both feet 09/12/2021   OSA (obstructive sleep apnea) 09/12/2021   Stage 3b chronic kidney disease (HCC) 07/18/2021   Elevated alkaline phosphatase level 07/18/2021   Elevated liver enzymes 07/18/2021   Chronic neck pain 07/13/2021   Chronic back pain 07/13/2021   Urge incontinence of urine 07/13/2021   Incontinence of feces 07/13/2021   Eczema 07/13/2021   Paresthesia of arm 07/13/2021   Status post left partial knee replacement 07/13/2021   History of thyroid cancer 07/13/2021   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   Hyperlipidemia 07/13/2021   Essential hypertension, benign 07/13/2021   Elevated uric acid in blood 07/13/2021   Insomnia 07/13/2021   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     REFERRING PROVIDER: Kay Jerri, MD  REFERRING DIAG:  Z96.652 (ICD-10-CM) - H/O total knee replacement, left      Rationale for Evaluation and Treatment: Rehabilitation  THERAPY DIAG:  Acute pain of left knee  Stiffness of left knee, not elsewhere classified  Difficulty in walking, not elsewhere classified  ONSET DATE: DOS 10/15/23  Days since surgery: 95  SUBJECTIVE:                                                                                                                                                                                           SUBJECTIVE STATEMENT: I got to 120 at my last session! ( L knee flexion).  Pt reports that she has been working on ROM (flexion) in her bed and holding stretches longer, I think that has helped.    POOL ACCESS: pt is member of [employer], with membership on hold while in PT.   PERTINENT HISTORY:  Partial TKA 2018  PAIN:  Are you having pain? Yes: NPRS scale: 2/10 Pain location: L knee Pain description: Stiffness Aggravating factors: sitting Relieving  factors: rest  PRECAUTIONS:  None  RED FLAGS: None   WEIGHT BEARING RESTRICTIONS:  No  FALLS:  Has patient fallen in last 6 months? No  LIVING ENVIRONMENT: 14 steps at home  OCCUPATION:  Not working  PLOF:  Independent  PATIENT GOALS:  Stairs, walking, getting ready to move in June, play with grand children.    OBJECTIVE:  Note: Objective measures were completed at Evaluation unless otherwise noted.  PATIENT SURVEYS:  FOTO 41  COGNITIVE STATUS: Within functional limits for tasks assessed   EDEMA:  Yes: Lt knee  GAIT: Eval: with RW, lacking full knee ext at heel strike   Body Part #1 Knee  PALPATION: Spongy end feel  LOWER EXTREMITY ROM:     Passive  Left eval Lt  12/14 Left  Left 1/15 Left  2/4   Hip flexion       Hip extension       Hip abduction       Hip adduction       Hip internal rotation       Hip external rotation       Knee flexion 100 114 115 107  120  Knee extension 0  4  0   (Blank rows = not tested)       TREATMENT:  2/7 Pt seen for aquatic therapy today.  Treatment took place in water 3.5-4.75 ft in depth at the [facility] pool. Temp of water was 91.  Pt entered/exited the pool via stairs in step-through pattern independently with bilat rail.  - tandem gait forward/ backward - walking unsupported: forward/ backward -> marching for increased L knee flexion squats (vertical trunk) pushing blue hand float under water 2 x 15 - side stepping with arm addct/ abdct with yellow hand floats, cues for decreased step length and increased step height - UE on yellow hand floats:  walking forward lunges -> repeated at wall  - walking forward kicks (hip flex to LAQ)  - light jogging forward/ backward  - with single UE support: L SLS -> 3 way toe taps x 10 LLE - UE on wall:  hip abdct/ addct 2 x 10 -  straddling noodle with UE on corner: cycling, hip abdct/ addct, cross country ski   2/4 Manual  PROM L knee Tib/fem mobilizations posterior glide grade II-III Patella mobilizations  There-ex  SLR 3x10  Bridges 2x10  SAQ 3x12 2.5 lbs   LAQ  2.5 lbs 3x12   There act:  Step up 4 inches 2x10  Mini squats 2x10  Heel/toe x20    1/31 Pt seen for aquatic therapy today.  Treatment took place in water 3.5-4.75 ft in depth at the [facility] pool. Temp of water was 91.  Pt entered/exited the pool via stairs in step-through pattern independently with bilat rail.  - intro to aquatic therapy principles and properties - walking unsupported: forward/ backward -> marching for increased L knee flexion - side stepping with arm addct/ abdct with yellow hand floats, cues for decreased step length and increased step height - E on yellow hand floats:  hip abdct/ addct x 10 ->  - without UE support : L SLS -> 3 way toe taps x 20 LLE, x 5 RLE; Heel/toe raises x 10 - squats (vertical trunk) pushing yellow hand float under water x 10 - seated on bench in water: LAQ with DF, with increased speedx 10; hip abdct/ addct x 10; flutter kick - forward lunge with L foot on 2nd step x 10s hold x 4 reps - runners step ups L x 10 - tandem gait forward/ backward   1/29  Manual  PROM L knee Tib/fem mobilizations posterior glide grade II-III Patella mobilizations  SLR 3x10  Bridges 2x10  SAQ 3x12 0 lbs and 1.5 lbs and 2lbs  LAQ 1.5 lbs 1x12 2x132 2 lbs  Heel/toe x20  Step up 4 inches 2x10  Mini squats 2x10   Treatment                            1/20:  Nu step L5 LE only 5 min IASTM lateral HS- ROM improved from 108 to 115 deg flexion Supine active hamstring stretch Straight leg lower- to 90- extend to ceiling  STM to hamstrings - passive flexion to 116 following SLS Golfer hinge Single leg eccentric step down- small range Standing gastroc stretch     PATIENT EDUCATION:  Education  details: Teacher, Music of condition, exercise form/rationale Person educated: Spouse Education method: Explanation, Demonstration, Actor cues, Verbal cues,  Education comprehension: verbalized understanding, returned demonstration, verbal cues required, tactile cues required, and needs further education  HOME EXERCISE PROGRAM:  [email or web address]  Access Code: [email or web address]   ASSESSMENT:  CLINICAL IMPRESSION: Pt demonstrated improved balance in water, in  tandem stance and SLS, compared to last session.  Pt shown more exercises she can incorporate to a pool work out, when returning to exercising at [employer].  All exercises tolerated well, without increase in pain.  Discussed desensitizing knee to pressure/ texture for her to eventually be able to tolerate kneeling in future.  Therapy will continue to progress as tolerated.   REHAB POTENTIAL: Good  CLINICAL DECISION MAKING: Stable/uncomplicated  EVALUATION COMPLEXITY: Low   GOALS: Goals reviewed with patient? Yes  SHORT TERM GOALS: Target date: 12/01/23  ROM 0-120 Baseline: Goal status: Achieved 2/4  2.  Full knee extension at heel strike in gait Baseline:  Goal status: Improved heel strike with gait achieved 2/4  3.  Able to ambulate a few steps without antalgic pattern, without AD Baseline:  Goal status: Achieved 2/4    LONG TERM GOALS: Target date: POC date   Meet FOTO goal Baseline:  Goal status: INITIAL  2.  Perform stairs step over step, ascending and descending Baseline:  Goal status: IN PROGRESS - 01/11/24  3.  Average pain <=2/10 Baseline:  Goal status: Minimal pain achieved 2/5  4.  Return to gym program at [facility] Baseline:  Goal status: INITIAL    PLAN:  PT FREQUENCY: 1-2x/week  PT DURATION: 12 weeks  PLANNED INTERVENTIONS: 97164- PT Re-evaluation, 97110-Therapeutic exercises, 97530- Therapeutic activity, 97112- Neuromuscular re-education, 97535- Self Care, 02859- Manual therapy, [phone]- Gait  training, [phone]- Aquatic Therapy, [phone]- Vasopneumatic device, Patient/Family education, Balance training, Stair training, Taping, Dry Needling, Joint mobilization, Spinal mobilization, Scar mobilization, Cryotherapy, and Moist heat.  PLAN FOR NEXT SESSION: ROM, glut activation and eccentric hamstrings  Delon Aquas, PTA 01/18/24 2:47 PM [facility] [address] [place], [place], [address] Phone: [phone]   Fax:  [phone]

## 2024-01-22 ENCOUNTER — Ambulatory Visit (HOSPITAL_BASED_OUTPATIENT_CLINIC_OR_DEPARTMENT_OTHER): Payer: Managed Care, Other (non HMO)

## 2024-01-22 ENCOUNTER — Encounter (HOSPITAL_BASED_OUTPATIENT_CLINIC_OR_DEPARTMENT_OTHER): Payer: Self-pay

## 2024-01-22 DIAGNOSIS — M25662 Stiffness of left knee, not elsewhere classified: Secondary | ICD-10-CM

## 2024-01-22 DIAGNOSIS — R262 Difficulty in walking, not elsewhere classified: Secondary | ICD-10-CM

## 2024-01-22 DIAGNOSIS — M25562 Pain in left knee: Secondary | ICD-10-CM | POA: Diagnosis not present

## 2024-01-22 NOTE — Therapy (Signed)
OUTPATIENT PHYSICAL THERAPY TREATMENT   Patient Name: Lauren [name] MRN: [id number] DOB:05-27-1961, 63 y.o., female Today's Date: 01/22/2024  END OF SESSION:  PT End of Session - 01/22/24 1334     Visit Number 13    Number of Visits 25    Date for PT Re-Evaluation 02/09/24    Authorization Type [employer]    PT Start Time 1305    PT Stop Time 1346    PT Time Calculation (min) 41 min    Activity Tolerance Patient tolerated treatment well    Behavior During Therapy WFL for tasks assessed/performed             Past Medical History:  Diagnosis Date   Anemia    CKD stage 3b, GFR 30-44 ml/min (HCC)    Complication of anesthesia    Diabetes mellitus, type 2 (HCC)    Eczema    GERD (gastroesophageal reflux disease)    Glaucoma    Gout    History of kidney stones    Hyperlipidemia    Hypertension    Metabolic dysfunction-associated steatohepatitis (MASH)    PONV (postoperative nausea and vomiting)    Primary biliary cholangitis (HCC)    Sarcoidosis    Sleep apnea    Thyroid cancer (HCC)    Past Surgical History:  Procedure Laterality Date   CATARACT EXTRACTION Bilateral    COLONOSCOPY     GASTRIC BYPASS     KNEE SURGERY Left 2017   LIVER BIOPSY  01/2022   Novosure     TOTAL KNEE REVISION Left 10/15/2023   Procedure: REVISION LEFT PARTIAL KNEE to TOTAL KNEE ARTHROPLASTY;  Surgeon: Tarry Kos, MD;  Location: [facility] OR;  Service: Orthopedics;  Laterality: Left;   TUBAL LIGATION     Patient Active Problem List   Diagnosis Date Noted   Primary osteoarthritis of left knee 10/15/2023   Status post total left knee replacement 10/15/2023   Type 2 diabetes mellitus with hyperglycemia, without long-term current use of insulin (HCC) 02/20/2023   Aortic atherosclerosis (HCC) 02/20/2023   Primary biliary cholangitis (HCC) 10/06/2022   Kidney stone 06/30/2022   Encounter for health maintenance examination in adult 06/30/2022   History of gastric bypass 06/30/2022   Anemia 06/30/2022    Altered mental status 06/30/2022   Nausea and vomiting 06/30/2022   Other constipation 02/14/2022   Liver hematoma 02/05/2022   Elevated LFTs 01/12/2022   Allergic rhinitis due to pollen 09/12/2021   Pain in joints of both feet 09/12/2021   OSA (obstructive sleep apnea) 09/12/2021   Stage 3b chronic kidney disease (HCC) 07/18/2021   Elevated alkaline phosphatase level 07/18/2021   Elevated liver enzymes 07/18/2021   Chronic neck pain 07/13/2021   Chronic back pain 07/13/2021   Urge incontinence of urine 07/13/2021   Incontinence of feces 07/13/2021   Eczema 07/13/2021   Paresthesia of arm 07/13/2021   Status post left partial knee replacement 07/13/2021   History of thyroid cancer 07/13/2021   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   Hyperlipidemia 07/13/2021   Essential hypertension, benign 07/13/2021   Elevated uric acid in blood 07/13/2021   Insomnia 07/13/2021   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     REFERRING PROVIDER: Gershon Mussel, MD  REFERRING DIAG:  Z96.652 (ICD-10-CM) - H/O total knee replacement, left      Rationale for Evaluation and Treatment: Rehabilitation  THERAPY DIAG:  Acute pain of left knee  Difficulty in walking, not elsewhere classified  Stiffness of left knee, not elsewhere classified  ONSET DATE: DOS 10/15/23  Days since surgery: 99   SUBJECTIVE:                                                                                                                                                                                           SUBJECTIVE STATEMENT: Pt reports pain in back of knee which "comes and goes". Can get up to a 4/10.    POOL ACCESS: pt is member of [employer], with membership on hold while in PT.   PERTINENT HISTORY:  Partial TKA 2018  PAIN:  Are you having pain? Yes: NPRS scale: 4/10 Pain location: L knee Pain description: Stiffness Aggravating factors: sitting Relieving factors: rest  PRECAUTIONS:  None  RED  FLAGS: None   WEIGHT BEARING RESTRICTIONS:  No  FALLS:  Has patient fallen in last 6 months? No  LIVING ENVIRONMENT: 14 steps at home  OCCUPATION:  Not working  PLOF:  Independent  PATIENT GOALS:  Stairs, walking, getting ready to move in June, play with grand children.    OBJECTIVE:  Note: Objective measures were completed at Evaluation unless otherwise noted.  PATIENT SURVEYS:  FOTO 41  COGNITIVE STATUS: Within functional limits for tasks assessed   EDEMA:  Yes: Lt knee  GAIT: Eval: with RW, lacking full knee ext at heel strike   Body Part #1 Knee  PALPATION: Spongy end feel  LOWER EXTREMITY ROM:     Passive  Left eval Lt  12/14 Left  Left 1/15 Left  2/4   Hip flexion       Hip extension       Hip abduction       Hip adduction       Hip internal rotation       Hip external rotation       Knee flexion 100 114 115 107  120  Knee extension 0  4  0   (Blank rows = not tested)       TREATMENT:  Treatment                            01/22/2024: Blank lines following charge title = not provided on this treatment date.   Manual:  TPDN No PROM- 118deg flexion Patella mobilizations STM HS and quads There-ex: Nu-Step L5 x43min SLR 3x10  LAQ 3x10 3# Standing HSC 3# 3x10 There-Act: Bridges 2x15 Squats 3x10 Standing march x20 Self Care:  Nuro-Re-ed:  Gait Training:    2/7 Pt seen for aquatic therapy today.  Treatment took place in water 3.5-4.75 ft in depth at the [facility] pool. Temp of water was 91.  Pt entered/exited the pool via stairs in step-through pattern independently with bilat rail.  - tandem gait forward/ backward - walking unsupported: forward/ backward -> marching for increased L knee flexion squats (vertical trunk) pushing blue hand float under water 2 x 15 - side stepping with arm  addct/ abdct with yellow hand floats, cues for decreased step length and increased step height - UE on yellow hand floats:  walking forward lunges -> repeated at wall  - walking forward kicks (hip flex to LAQ)  - light jogging forward/ backward  - with single UE support: L SLS -> 3 way toe taps x 10 LLE - UE on wall:  hip abdct/ addct 2 x 10 - straddling noodle with UE on corner: cycling, hip abdct/ addct, cross country ski   2/4 Manual  PROM L knee Tib/fem mobilizations posterior glide grade II-III Patella mobilizations  There-ex  SLR 3x10  Bridges 2x10  SAQ 3x12 2.5 lbs   LAQ  2.5 lbs 3x12   There act:  Step up 4 inches 2x10  Mini squats 2x10  Heel/toe x20    PATIENT EDUCATION:  Education details: Teacher, music of condition, exercise form/rationale Person educated: Spouse Education method: Explanation, Demonstration, Actor cues, Verbal cues,  Education comprehension: verbalized understanding, returned demonstration, verbal cues required, tactile cues required, and needs further education  HOME EXERCISE PROGRAM:  [email or web address]  Access Code: [email or web address]   ASSESSMENT:  CLINICAL IMPRESSION: Pt was measured at 118 deg active knee flexion following PROM and MT. Slight increase in stiffness compared to last land visit. Some tenderness at medial inferior knee. Good tolerance for therex and therAct interventions. Improving with squat depth and control. No c/o with resistance to LAQ and standing HSC. Will continue to progress ROM and strengthening as tolerated.    REHAB POTENTIAL: Good  CLINICAL DECISION MAKING: Stable/uncomplicated  EVALUATION COMPLEXITY: Low   GOALS: Goals reviewed with patient? Yes  SHORT TERM GOALS: Target date: 12/01/23  ROM 0-120 Baseline: Goal status: Achieved 2/4  2.  Full knee extension at heel strike in gait Baseline:  Goal status: Improved heel strike with gait achieved 2/4  3.  Able to ambulate a few steps without antalgic  pattern, without AD Baseline:  Goal status: Achieved 2/4    LONG TERM GOALS: Target date: POC date   Meet FOTO goal Baseline:  Goal status: INITIAL  2.  Perform stairs step over step, ascending and descending Baseline:  Goal status: IN PROGRESS - 01/11/24  3.  Average pain <=2/10 Baseline:  Goal status: Minimal pain achieved 2/5  4.  [facility] program [facility] Baseline:  Goal status: INITIAL    PLAN:  PT FREQUENCY: 1-2x/week  PT DURATION: 12 weeks  PLANNED INTERVENTIONS: 97164- PT Re-evaluation, 97110-Therapeutic exercises, 97530- Therapeutic activity, 97112- Neuromuscular re-education, 97535- Self Care, 54098- Manual therapy,  16109- Gait training, 60454- Aquatic Therapy, [phone]- Vasopneumatic device, Patient/Family education, Balance training, Stair training, Taping, Dry Needling, Joint mobilization, Spinal mobilization, Scar mobilization, Cryotherapy, and Moist heat.  PLAN FOR NEXT SESSION: ROM, glut activation and eccentric hamstrings   Donnel Saxon Tiesha Marich, PTA 01/22/2024, 2:29 PM

## 2024-01-25 ENCOUNTER — Encounter (HOSPITAL_BASED_OUTPATIENT_CLINIC_OR_DEPARTMENT_OTHER): Payer: Self-pay | Admitting: Physical Therapy

## 2024-01-25 ENCOUNTER — Ambulatory Visit (HOSPITAL_BASED_OUTPATIENT_CLINIC_OR_DEPARTMENT_OTHER): Payer: Managed Care, Other (non HMO) | Admitting: Physical Therapy

## 2024-01-25 DIAGNOSIS — M25562 Pain in left knee: Secondary | ICD-10-CM | POA: Diagnosis not present

## 2024-01-25 DIAGNOSIS — R262 Difficulty in walking, not elsewhere classified: Secondary | ICD-10-CM

## 2024-01-25 DIAGNOSIS — M25662 Stiffness of left knee, not elsewhere classified: Secondary | ICD-10-CM

## 2024-01-25 NOTE — Therapy (Signed)
OUTPATIENT PHYSICAL THERAPY TREATMENT   Patient Name: Lauren [name] MRN: [id number] DOB:1961-07-21, 63 y.o., female Today's Date: 01/25/2024  END OF SESSION:  PT End of Session - 01/25/24 1306     Visit Number 14    Number of Visits 25    Date for PT Re-Evaluation 02/09/24    Authorization Type [employer]    PT Start Time 1303    PT Stop Time 1341    PT Time Calculation (min) 38 min             Past Medical History:  Diagnosis Date   Anemia    CKD stage 3b, GFR 30-44 ml/min (HCC)    Complication of anesthesia    Diabetes mellitus, type 2 (HCC)    Eczema    GERD (gastroesophageal reflux disease)    Glaucoma    Gout    History of kidney stones    Hyperlipidemia    Hypertension    Metabolic dysfunction-associated steatohepatitis (MASH)    PONV (postoperative nausea and vomiting)    Primary biliary cholangitis (HCC)    Sarcoidosis    Sleep apnea    Thyroid cancer (HCC)    Past Surgical History:  Procedure Laterality Date   CATARACT EXTRACTION Bilateral    COLONOSCOPY     GASTRIC BYPASS     KNEE SURGERY Left 2017   LIVER BIOPSY  01/2022   Novosure     TOTAL KNEE REVISION Left 10/15/2023   Procedure: REVISION LEFT PARTIAL KNEE to TOTAL KNEE ARTHROPLASTY;  Surgeon: Tarry Kos, MD;  Location: [facility] OR;  Service: Orthopedics;  Laterality: Left;   TUBAL LIGATION     Patient Active Problem List   Diagnosis Date Noted   Primary osteoarthritis of left knee 10/15/2023   Status post total left knee replacement 10/15/2023   Type 2 diabetes mellitus with hyperglycemia, without long-term current use of insulin (HCC) 02/20/2023   Aortic atherosclerosis (HCC) 02/20/2023   Primary biliary cholangitis (HCC) 10/06/2022   Kidney stone 06/30/2022   Encounter for health maintenance examination in adult 06/30/2022   History of gastric bypass 06/30/2022   Anemia 06/30/2022   Altered mental status 06/30/2022   Nausea and vomiting 06/30/2022   Other constipation 02/14/2022   Liver  hematoma 02/05/2022   Elevated LFTs 01/12/2022   Allergic rhinitis due to pollen 09/12/2021   Pain in joints of both feet 09/12/2021   OSA (obstructive sleep apnea) 09/12/2021   Stage 3b chronic kidney disease (HCC) 07/18/2021   Elevated alkaline phosphatase level 07/18/2021   Elevated liver enzymes 07/18/2021   Chronic neck pain 07/13/2021   Chronic back pain 07/13/2021   Urge incontinence of urine 07/13/2021   Incontinence of feces 07/13/2021   Eczema 07/13/2021   Paresthesia of arm 07/13/2021   Status post left partial knee replacement 07/13/2021   History of thyroid cancer 07/13/2021   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   Hyperlipidemia 07/13/2021   Essential hypertension, benign 07/13/2021   Elevated uric acid in blood 07/13/2021   Insomnia 07/13/2021   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     REFERRING PROVIDER: Gershon Mussel, MD  REFERRING DIAG:  Z96.652 (ICD-10-CM) - H/O total knee replacement, left      Rationale for Evaluation and Treatment: Rehabilitation  THERAPY DIAG:  Acute pain of left knee  Difficulty in walking, not elsewhere classified  Stiffness of left knee, not elsewhere classified  ONSET DATE: DOS 10/15/23  Days since surgery: 102   SUBJECTIVE:  SUBJECTIVE STATEMENT: Pt reports pain in back of knee which "comes and goes".  She states that her L knee flexion was not as much last session. She has been working on desensitizing knee.    POOL ACCESS: pt is member of [employer], with membership on hold while in PT.   PERTINENT HISTORY:  Partial TKA 2018  PAIN:  Are you having pain? no: NPRS scale: 0/10 Pain location: L knee Pain description:  Aggravating factors: sitting Relieving factors: rest  PRECAUTIONS:  None  RED FLAGS: None   WEIGHT BEARING  RESTRICTIONS:  No  FALLS:  Has patient fallen in last 6 months? No  LIVING ENVIRONMENT: 14 steps at home  OCCUPATION:  Not working  PLOF:  Independent  PATIENT GOALS:  Stairs, walking, getting ready to move to [place] in June, play with grand children.    OBJECTIVE:  Note: Objective measures were completed at Evaluation unless otherwise noted.  PATIENT SURVEYS:  FOTO 41  COGNITIVE STATUS: Within functional limits for tasks assessed   EDEMA:  Yes: Lt knee  GAIT: Eval: with RW, lacking full knee ext at heel strike   Body Part #1 Knee  PALPATION: Spongy end feel  LOWER EXTREMITY ROM:     Passive  Left eval Lt  12/14 Left  Left 1/15 Left  2/4   Hip flexion       Hip extension       Hip abduction       Hip adduction       Hip internal rotation       Hip external rotation       Knee flexion 100 114 115 107  120  Knee extension 0  4  0   (Blank rows = not tested)       TREATMENT:                                                                                                                              [facility] Adult PT Treatment:                                                DATE: 01/25/24 Pt seen for aquatic therapy today.  Treatment took place in water 3.5-4.75 ft in depth at the [facility] pool. Temp of water was 91.  Pt entered/exited the pool via stairs in step-through pattern independently with bilat rail.  - walking unsupported: forward/ backward  2 laps - marching backward/ forward walking kicks  - side stepping with arm addct/ abdct with yellow hand floats, cues for decreased step length and increased step height - UE on wall: split squats x 10 each LE - light jogging forward/ backward  - UE on yellow hand floats:  leg swings into hip flex/ext and hip abdct/ addct crossing midline - intermittent UE support:  3 way toe tap in L SLS x 10 - light jogging forward with butt kickers (moderate knee discomfort)  - squats (vertical trunk)  pushing yellow hand float under water 2 x 10 - 3 way LE stretch with back against wall ankle on noodle (ITB, hamstring, adductor)  - quad stretch with ankle on noodle  - discussed body mechanics for getting out of tub  Treatment                            01/22/2024: Blank lines following charge title = not provided on this treatment date.   Manual:  TPDN No PROM- 118deg flexion Patella mobilizations STM HS and quads There-ex: Nu-Step L5 x68min SLR 3x10  LAQ 3x10 3# Standing HSC 3# 3x10 There-Act: Bridges 2x15 Squats 3x10 Standing march x20 Self Care:  Nuro-Re-ed:  Gait Training:    2/7 Pt seen for aquatic therapy today.  Treatment took place in water 3.5-4.75 ft in depth at the [facility] pool. Temp of water was 91.  Pt entered/exited the pool via stairs in step-through pattern independently with bilat rail.  - tandem gait forward/ backward - walking unsupported: forward/ backward -> marching for increased L knee flexion squats (vertical trunk) pushing blue hand float under water 2 x 15 - side stepping with arm addct/ abdct with yellow hand floats, cues for decreased step length and increased step height - UE on yellow hand floats:  walking forward lunges -> repeated at wall  - walking forward kicks (hip flex to LAQ)  - light jogging forward/ backward  - with single UE support: L SLS -> 3 way toe taps x 10 LLE - UE on wall:  hip abdct/ addct 2 x 10 - straddling noodle with UE on corner: cycling, hip abdct/ addct, cross country ski   2/4 Manual  PROM L knee Tib/fem mobilizations posterior glide grade II-III Patella mobilizations  There-ex  SLR 3x10  Bridges 2x10  SAQ 3x12 2.5 lbs   LAQ  2.5 lbs 3x12   There act:  Step up 4 inches 2x10  Mini squats 2x10  Heel/toe x20    PATIENT EDUCATION:  Education details: Teacher, music of condition, exercise form/rationale Person educated: Spouse Education method: Explanation, Demonstration, Actor cues,  Verbal cues,  Education comprehension: verbalized understanding, returned demonstration, verbal cues required, tactile cues required, and needs further education  HOME EXERCISE PROGRAM:  [email or web address]  Access Code: [email or web address]   ASSESSMENT:  CLINICAL IMPRESSION: Pt reported some increase in posterior knee pain with jogging in pool with butt kicks; eased with change in exercise. All other exercises tolerated well.  Balance in water improving. Will continue to progress ROM and strengthening as tolerated. Therapist to ck FOTO in upcoming session, as time allows.    REHAB POTENTIAL: Good  CLINICAL DECISION MAKING: Stable/uncomplicated  EVALUATION COMPLEXITY: Low   GOALS: Goals reviewed with patient? Yes  SHORT TERM GOALS: Target date: 12/01/23  ROM 0-120 Baseline: Goal status: Achieved 2/4  2.  Full knee extension at heel strike in gait Baseline:  Goal status: Improved heel strike with gait achieved 2/4  3.  Able to ambulate a few steps without antalgic pattern, without AD Baseline:  Goal status: Achieved 2/4    LONG TERM GOALS: Target date: POC date   Meet FOTO goal Baseline:  Goal status: INITIAL  2.  Perform stairs step over step, ascending and descending Baseline:  Goal status: IN PROGRESS - 01/11/24  3.  Average  pain <=2/10 Baseline:  Goal status: Minimal pain achieved 2/5  4.  Return to gym program at [facility] Baseline:  Goal status: INITIAL    PLAN:  PT FREQUENCY: 1-2x/week  PT DURATION: 12 weeks  PLANNED INTERVENTIONS: 97164- PT Re-evaluation, 97110-Therapeutic exercises, 97530- Therapeutic activity, 97112- Neuromuscular re-education, 97535- Self Care, 16109- Manual therapy, [phone]- Gait training, [phone]- Aquatic Therapy, [phone]- Vasopneumatic device, Patient/Family education, Balance training, Stair training, Taping, Dry Needling, Joint mobilization, Spinal mobilization, Scar mobilization, Cryotherapy, and Moist heat.  PLAN FOR NEXT  SESSION: ROM, glut activation and eccentric hamstrings  Mayer Camel, PTA 01/25/24 5:21 PM [facility] [address] [place], [place], [address] Phone: [phone]   Fax:  [phone]

## 2024-01-29 ENCOUNTER — Ambulatory Visit (HOSPITAL_BASED_OUTPATIENT_CLINIC_OR_DEPARTMENT_OTHER): Payer: Managed Care, Other (non HMO) | Admitting: Physical Therapy

## 2024-01-29 ENCOUNTER — Encounter (HOSPITAL_BASED_OUTPATIENT_CLINIC_OR_DEPARTMENT_OTHER): Payer: Self-pay | Admitting: Physical Therapy

## 2024-01-29 DIAGNOSIS — M25562 Pain in left knee: Secondary | ICD-10-CM | POA: Diagnosis not present

## 2024-01-29 DIAGNOSIS — M25662 Stiffness of left knee, not elsewhere classified: Secondary | ICD-10-CM

## 2024-01-29 DIAGNOSIS — R262 Difficulty in walking, not elsewhere classified: Secondary | ICD-10-CM

## 2024-01-29 NOTE — Therapy (Signed)
OUTPATIENT PHYSICAL THERAPY TREATMENT   Patient Name: Lauren [name] MRN: [id number] DOB:Aug 18, 1961, 63 y.o., female Today's Date: 01/30/2024  END OF SESSION:  PT End of Session - 01/29/24 1301     Visit Number 15    Number of Visits 25    Date for PT Re-Evaluation 02/09/24    PT Start Time 1300    PT Stop Time 1343    PT Time Calculation (min) 43 min    Activity Tolerance Patient tolerated treatment well    Behavior During Therapy WFL for tasks assessed/performed             Past Medical History:  Diagnosis Date   Anemia    CKD stage 3b, GFR 30-44 ml/min (HCC)    Complication of anesthesia    Diabetes mellitus, type 2 (HCC)    Eczema    GERD (gastroesophageal reflux disease)    Glaucoma    Gout    History of kidney stones    Hyperlipidemia    Hypertension    Metabolic dysfunction-associated steatohepatitis (MASH)    PONV (postoperative nausea and vomiting)    Primary biliary cholangitis (HCC)    Sarcoidosis    Sleep apnea    Thyroid cancer (HCC)    Past Surgical History:  Procedure Laterality Date   CATARACT EXTRACTION Bilateral    COLONOSCOPY     GASTRIC BYPASS     KNEE SURGERY Left 2017   LIVER BIOPSY  01/2022   Novosure     TOTAL KNEE REVISION Left 10/15/2023   Procedure: REVISION LEFT PARTIAL KNEE to TOTAL KNEE ARTHROPLASTY;  Surgeon: Tarry Kos, MD;  Location: [facility] OR;  Service: Orthopedics;  Laterality: Left;   TUBAL LIGATION     Patient Active Problem List   Diagnosis Date Noted   Primary osteoarthritis of left knee 10/15/2023   Status post total left knee replacement 10/15/2023   Type 2 diabetes mellitus with hyperglycemia, without long-term current use of insulin (HCC) 02/20/2023   Aortic atherosclerosis (HCC) 02/20/2023   Primary biliary cholangitis (HCC) 10/06/2022   Kidney stone 06/30/2022   Encounter for health maintenance examination in adult 06/30/2022   History of gastric bypass 06/30/2022   Anemia 06/30/2022   Altered mental status  06/30/2022   Nausea and vomiting 06/30/2022   Other constipation 02/14/2022   Liver hematoma 02/05/2022   Elevated LFTs 01/12/2022   Allergic rhinitis due to pollen 09/12/2021   Pain in joints of both feet 09/12/2021   OSA (obstructive sleep apnea) 09/12/2021   Stage 3b chronic kidney disease (HCC) 07/18/2021   Elevated alkaline phosphatase level 07/18/2021   Elevated liver enzymes 07/18/2021   Chronic neck pain 07/13/2021   Chronic back pain 07/13/2021   Urge incontinence of urine 07/13/2021   Incontinence of feces 07/13/2021   Eczema 07/13/2021   Paresthesia of arm 07/13/2021   Status post left partial knee replacement 07/13/2021   History of thyroid cancer 07/13/2021   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   Hyperlipidemia 07/13/2021   Essential hypertension, benign 07/13/2021   Elevated uric acid in blood 07/13/2021   Insomnia 07/13/2021   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     REFERRING PROVIDER: Gershon Mussel, MD  REFERRING DIAG:  Z96.652 (ICD-10-CM) - H/O total knee replacement, left      Rationale for Evaluation and Treatment: Rehabilitation  THERAPY DIAG:  Acute pain of left knee  Difficulty in walking, not elsewhere classified  Stiffness of left knee, not elsewhere classified  ONSET DATE: DOS 10/15/23  Days  since surgery: 106   SUBJECTIVE:                                                                                                                                                                                           SUBJECTIVE STATEMENT: Pt reports pain in back of knee which "comes and goes".  She states that her L knee flexion was not as much last session. She has been working on desensitizing knee.    POOL ACCESS: pt is member of [employer], with membership on hold while in PT.   PERTINENT HISTORY:  Partial TKA 2018  PAIN:  Are you having pain? no: NPRS scale: 0/10 Pain location: L knee Pain description:  Aggravating factors:  sitting Relieving factors: rest  PRECAUTIONS:  None  RED FLAGS: None   WEIGHT BEARING RESTRICTIONS:  No  FALLS:  Has patient fallen in last 6 months? No  LIVING ENVIRONMENT: 14 steps at home  OCCUPATION:  Not working  PLOF:  Independent  PATIENT GOALS:  Stairs, walking, getting ready to move to [place] in June, play with grand children.    OBJECTIVE:  Note: Objective measures were completed at Evaluation unless otherwise noted.  PATIENT SURVEYS:  FOTO 41  COGNITIVE STATUS: Within functional limits for tasks assessed   EDEMA:  Yes: Lt knee  GAIT: Eval: with RW, lacking full knee ext at heel strike   Body Part #1 Knee  PALPATION: Spongy end feel  LOWER EXTREMITY ROM:     Passive  Left eval Lt  12/14 Left  Left 1/15 Left  2/4   Hip flexion       Hip extension       Hip abduction       Hip adduction       Hip internal rotation       Hip external rotation       Knee flexion 100 114 115 107  120  Knee extension 0  4  0   (Blank rows = not tested)       TREATMENT:  2/18 Manual  PROM L knee Tib/fem mobilizations posterior glide grade II-III Patella mobilizations  There-ex  SLR 3x10  Bridges 2x10  SAQ 3x12 2.5 lbs   LAQ  2.5 lbs 3x12  Nustep for self stretch x5 min  Seated hamstring curl 3x15 red  Seated hip abduction 3x15 green   Neuro-re-ed: Step onto air-ex fwd and lateral with left x20      [facility] Adult PT Treatment:                                                DATE: 01/25/24 Pt seen for aquatic therapy today.  Treatment took place in water 3.5-4.75 ft in depth at the [facility] pool. Temp of water was 91.  Pt entered/exited the pool via stairs in step-through pattern independently with bilat rail.  - walking unsupported: forward/ backward  2 laps - marching backward/ forward walking kicks   - side stepping with arm addct/ abdct with yellow hand floats, cues for decreased step length and increased step height - UE on wall: split squats x 10 each LE - light jogging forward/ backward  - UE on yellow hand floats:  leg swings into hip flex/ext and hip abdct/ addct crossing midline - intermittent UE support: 3 way toe tap in L SLS x 10 - light jogging forward with butt kickers (moderate knee discomfort)  - squats (vertical trunk) pushing yellow hand float under water 2 x 10 - 3 way LE stretch with back against wall ankle on noodle (ITB, hamstring, adductor)  - quad stretch with ankle on noodle  - discussed body mechanics for getting out of tub  Treatment                            01/22/2024: Blank lines following charge title = not provided on this treatment date.   Manual:  TPDN No PROM- 118deg flexion Patella mobilizations STM HS and quads There-ex: Nu-Step L5 x76min SLR 3x10  LAQ 3x10 3# Standing HSC 3# 3x10 There-Act: Bridges 2x15 Squats 3x10 Standing march x20 Self Care:  Nuro-Re-ed:  Gait Training:    2/7 Pt seen for aquatic therapy today.  Treatment took place in water 3.5-4.75 ft in depth at the [facility] pool. Temp of water was 91.  Pt entered/exited the pool via stairs in step-through pattern independently with bilat rail.  - tandem gait forward/ backward - walking unsupported: forward/ backward -> marching for increased L knee flexion squats (vertical trunk) pushing blue hand float under water 2 x 15 - side stepping with arm addct/ abdct with yellow hand floats, cues for decreased step length and increased step height - UE on yellow hand floats:  walking forward lunges -> repeated at wall  - walking forward kicks (hip flex to LAQ)  - light jogging forward/ backward  - with single UE support: L SLS -> 3 way toe taps x 10 LLE - UE on wall:  hip abdct/ addct 2 x 10 - straddling noodle with UE on corner: cycling, hip abdct/ addct, cross  country ski   2/4 Manual  PROM L knee Tib/fem mobilizations posterior glide grade II-III Patella mobilizations  There-ex  SLR 3x10  Bridges 2x10  SAQ 3x12 2.5 lbs   LAQ  2.5 lbs 3x12   There act:  Step up 4 inches  2x10  Mini squats 2x10  Heel/toe x20    PATIENT EDUCATION:  Education details: Teacher, music of condition, exercise form/rationale Person educated: Spouse Education method: Explanation, Demonstration, Actor cues, Verbal cues,  Education comprehension: verbalized understanding, returned demonstration, verbal cues required, tactile cues required, and needs further education  HOME EXERCISE PROGRAM:  [email or web address]  Access Code: [email or web address]   ASSESSMENT:  CLINICAL IMPRESSION: The patient came in today with significant pain in her right knee. She reports she think she twisted it. We focused more on seated exercises toay becuas eof her opposite leg. We discussed how to load her exercises. We also worked on instability work on the left  [facility] POTENTIAL: Good  CLINICAL DECISION MAKING: Stable/uncomplicated  EVALUATION COMPLEXITY: Low   GOALS: Goals reviewed with patient? Yes  SHORT TERM GOALS: Target date: 12/01/23  ROM 0-120 Baseline: Goal status: Achieved 2/4  2.  Full knee extension at heel strike in gait Baseline:  Goal status: Improved heel strike with gait achieved 2/4  3.  Able to ambulate a few steps without antalgic pattern, without AD Baseline:  Goal status: Achieved 2/4    LONG TERM GOALS: Target date: POC date   Meet FOTO goal Baseline:  Goal status: INITIAL  2.  Perform stairs step over step, ascending and descending Baseline:  Goal status: IN PROGRESS - 01/11/24  3.  Average pain <=2/10 Baseline:  Goal status: Minimal pain achieved 2/5  4.  Return to gym program at [facility] Baseline:  Goal status: INITIAL    PLAN:  PT FREQUENCY: 1-2x/week  PT DURATION: 12 weeks  PLANNED INTERVENTIONS: 97164- PT Re-evaluation,  97110-Therapeutic exercises, 97530- Therapeutic activity, 97112- Neuromuscular re-education, 97535- Self Care, 32440- Manual therapy, [phone]- Gait training, [phone]- Aquatic Therapy, [phone]- Vasopneumatic device, Patient/Family education, Balance training, Stair training, Taping, Dry Needling, Joint mobilization, Spinal mobilization, Scar mobilization, Cryotherapy, and Moist heat.  PLAN FOR NEXT SESSION: ROM, glut activation and eccentric hamstrings  Lorayne Bender PT DPT 01/30/24 7:18 AM [facility] [address] [place], [place], [address] Phone: [phone]   Fax:  [phone]

## 2024-01-30 ENCOUNTER — Encounter (HOSPITAL_BASED_OUTPATIENT_CLINIC_OR_DEPARTMENT_OTHER): Payer: Self-pay | Admitting: Physical Therapy

## 2024-02-01 ENCOUNTER — Encounter (HOSPITAL_BASED_OUTPATIENT_CLINIC_OR_DEPARTMENT_OTHER): Payer: Self-pay | Admitting: Physical Therapy

## 2024-02-01 ENCOUNTER — Ambulatory Visit (HOSPITAL_BASED_OUTPATIENT_CLINIC_OR_DEPARTMENT_OTHER): Payer: Managed Care, Other (non HMO) | Admitting: Physical Therapy

## 2024-02-01 DIAGNOSIS — M25562 Pain in left knee: Secondary | ICD-10-CM

## 2024-02-01 DIAGNOSIS — R262 Difficulty in walking, not elsewhere classified: Secondary | ICD-10-CM

## 2024-02-01 DIAGNOSIS — M25662 Stiffness of left knee, not elsewhere classified: Secondary | ICD-10-CM

## 2024-02-01 NOTE — Therapy (Signed)
OUTPATIENT PHYSICAL THERAPY TREATMENT   Patient Name: Lauren [name] MRN: [id number] DOB:02-10-1961, 63 y.o., female Today's Date: 02/01/2024  END OF SESSION:  PT End of Session - 02/01/24 1301     Visit Number 16    Number of Visits 25    Date for PT Re-Evaluation 02/09/24    Authorization Type [employer]    PT Start Time 1301    PT Stop Time 1340    PT Time Calculation (min) 39 min             Past Medical History:  Diagnosis Date   Anemia    CKD stage 3b, GFR 30-44 ml/min (HCC)    Complication of anesthesia    Diabetes mellitus, type 2 (HCC)    Eczema    GERD (gastroesophageal reflux disease)    Glaucoma    Gout    History of kidney stones    Hyperlipidemia    Hypertension    Metabolic dysfunction-associated steatohepatitis (MASH)    PONV (postoperative nausea and vomiting)    Primary biliary cholangitis (HCC)    Sarcoidosis    Sleep apnea    Thyroid cancer (HCC)    Past Surgical History:  Procedure Laterality Date   CATARACT EXTRACTION Bilateral    COLONOSCOPY     GASTRIC BYPASS     KNEE SURGERY Left 2017   LIVER BIOPSY  01/2022   Novosure     TOTAL KNEE REVISION Left 10/15/2023   Procedure: REVISION LEFT PARTIAL KNEE to TOTAL KNEE ARTHROPLASTY;  Surgeon: Tarry Kos, MD;  Location: [facility] OR;  Service: Orthopedics;  Laterality: Left;   TUBAL LIGATION     Patient Active Problem List   Diagnosis Date Noted   Primary osteoarthritis of left knee 10/15/2023   Status post total left knee replacement 10/15/2023   Type 2 diabetes mellitus with hyperglycemia, without long-term current use of insulin (HCC) 02/20/2023   Aortic atherosclerosis (HCC) 02/20/2023   Primary biliary cholangitis (HCC) 10/06/2022   Kidney stone 06/30/2022   Encounter for health maintenance examination in adult 06/30/2022   History of gastric bypass 06/30/2022   Anemia 06/30/2022   Altered mental status 06/30/2022   Nausea and vomiting 06/30/2022   Other constipation 02/14/2022   Liver  hematoma 02/05/2022   Elevated LFTs 01/12/2022   Allergic rhinitis due to pollen 09/12/2021   Pain in joints of both feet 09/12/2021   OSA (obstructive sleep apnea) 09/12/2021   Stage 3b chronic kidney disease (HCC) 07/18/2021   Elevated alkaline phosphatase level 07/18/2021   Elevated liver enzymes 07/18/2021   Chronic neck pain 07/13/2021   Chronic back pain 07/13/2021   Urge incontinence of urine 07/13/2021   Incontinence of feces 07/13/2021   Eczema 07/13/2021   Paresthesia of arm 07/13/2021   Status post left partial knee replacement 07/13/2021   History of thyroid cancer 07/13/2021   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   Hyperlipidemia 07/13/2021   Essential hypertension, benign 07/13/2021   Elevated uric acid in blood 07/13/2021   Insomnia 07/13/2021   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     REFERRING PROVIDER: Gershon Mussel, MD  REFERRING DIAG:  Z96.652 (ICD-10-CM) - H/O total knee replacement, left      Rationale for Evaluation and Treatment: Rehabilitation  THERAPY DIAG:  Acute pain of left knee  Difficulty in walking, not elsewhere classified  Stiffness of left knee, not elsewhere classified  ONSET DATE: DOS 10/15/23  Days since surgery: 109   SUBJECTIVE:  SUBJECTIVE STATEMENT: "I got to 123!"   POOL ACCESS: pt is member of [employer], with membership on hold while in PT.   PERTINENT HISTORY:  Partial TKA 2018  PAIN:  Are you having pain? no: NPRS scale: 0/10 Pain location: L knee Pain description:  Aggravating factors: sitting Relieving factors: rest  PRECAUTIONS:  None  RED FLAGS: None   WEIGHT BEARING RESTRICTIONS:  No  FALLS:  Has patient fallen in last 6 months? No  LIVING ENVIRONMENT: 14 steps at home  OCCUPATION:  Not working  PLOF:   Independent  PATIENT GOALS:  Stairs, walking, getting ready to move to [place] in June, play with grand children.    OBJECTIVE:  Note: Objective measures were completed at Evaluation unless otherwise noted.  PATIENT SURVEYS:  FOTO 41 ; goal of 70 on visit 16  01/22/24: 63  COGNITIVE STATUS: Within functional limits for tasks assessed   EDEMA:  Yes: Lt knee  GAIT: Eval: with RW, lacking full knee ext at heel strike   Body Part #1 Knee  PALPATION: Spongy end feel  LOWER EXTREMITY ROM:     Passive  Left eval Lt  12/14 Left  Left 1/15 Left  2/4   Hip flexion       Hip extension       Hip abduction       Hip adduction       Hip internal rotation       Hip external rotation       Knee flexion 100 114 115 107  120  Knee extension 0  4  0   (Blank rows = not tested)       TREATMENT:                                                                                                                              [facility] Adult PT Treatment:                                                DATE: 02/01/24 Pt seen for aquatic therapy today.  Treatment took place in water 3.5-4.75 ft in depth at the [facility] pool. Temp of water was 91.  Pt entered/exited the pool via stairs in step-through pattern independently with bilat rail.  - marching backward/ forward with reciprocal row with yellow hand floats - UE on yellow hand floats -> no support - 3 way toe taps x 10 - UE on yellow hand floats:  3 way LE kick x 10 - tandem gait without UE support in 65ft 6"   - squats (vertical trunk) pushing blue hand float under water 2 x 10 - SLS with noodle stomp without UE support with blue hollow noodle -> yellow noodle, each LE, trial fast and slow and  - 3 way LE stretch  with back against wall ankle on noodle (ITB, hamstring, adductor)  - quad stretch with ankle on noodle - fig 4 at stairs   2/18 Manual  PROM L knee Tib/fem mobilizations posterior glide grade II-III Patella  mobilizations  There-ex  SLR 3x10  Bridges 2x10  SAQ 3x12 2.5 lbs   LAQ  2.5 lbs 3x12  Nustep for self stretch x5 min  Seated hamstring curl 3x15 red  Seated hip abduction 3x15 green   Neuro-re-ed: Step onto air-ex fwd and lateral with left x20      [facility] Adult PT Treatment:                                                DATE: 01/25/24 Pt seen for aquatic therapy today.  Treatment took place in water 3.5-4.75 ft in depth at the [facility] pool. Temp of water was 91.  Pt entered/exited the pool via stairs in step-through pattern independently with bilat rail.  - walking unsupported: forward/ backward  2 laps - marching backward/ forward walking kicks  - side stepping with arm addct/ abdct with yellow hand floats, cues for decreased step length and increased step height - UE on wall: split squats x 10 each LE - light jogging forward/ backward  - UE on yellow hand floats:  leg swings into hip flex/ext and hip abdct/ addct crossing midline - intermittent UE support: 3 way toe tap in L SLS x 10 - light jogging forward with butt kickers (moderate knee discomfort)  - squats (vertical trunk) pushing yellow hand float under water 2 x 10 - 3 way LE stretch with back against wall ankle on noodle (ITB, hamstring, adductor)  - quad stretch with ankle on noodle  - discussed body mechanics for getting out of tub  Treatment                            01/22/2024: Blank lines following charge title = not provided on this treatment date.   Manual:  TPDN No PROM- 118deg flexion Patella mobilizations STM HS and quads There-ex: Nu-Step L5 x57min SLR 3x10  LAQ 3x10 3# Standing HSC 3# 3x10 There-Act: Bridges 2x15 Squats 3x10 Standing march x20 Self Care:  Nuro-Re-ed:  Gait Training:    2/7 Pt seen for aquatic therapy today.  Treatment took place in water 3.5-4.75 ft in depth at the [facility] pool. Temp of water was 91.  Pt entered/exited the pool via stairs in  step-through pattern independently with bilat rail.  - tandem gait forward/ backward - walking unsupported: forward/ backward -> marching for increased L knee flexion squats (vertical trunk) pushing blue hand float under water 2 x 15 - side stepping with arm addct/ abdct with yellow hand floats, cues for decreased step length and increased step height - UE on yellow hand floats:  walking forward lunges -> repeated at wall  - walking forward kicks (hip flex to LAQ)  - light jogging forward/ backward  - with single UE support: L SLS -> 3 way toe taps x 10 LLE - UE on wall:  hip abdct/ addct 2 x 10 - straddling noodle with UE on corner: cycling, hip abdct/ addct, cross country ski   2/4 Manual  PROM L knee Tib/fem mobilizations posterior glide grade II-III Patella mobilizations  There-ex  SLR  3x10  Bridges 2x10  SAQ 3x12 2.5 lbs   LAQ  2.5 lbs 3x12   There act:  Step up 4 inches 2x10  Mini squats 2x10  Heel/toe x20    PATIENT EDUCATION:  Education details: Teacher, music of condition, exercise form/rationale Person educated: Spouse Education method: Explanation, Demonstration, Actor cues, Verbal cues,  Education comprehension: verbalized understanding, returned demonstration, verbal cues required, tactile cues required, and needs further education  HOME EXERCISE PROGRAM:  [email or web address]  Access Code: [email or web address]  Aquatic Access Code: [email or web address] URL: [email or web address] Date: 02/01/2024  * not issued yet   ASSESSMENT:  CLINICAL IMPRESSION: Pt making significant gains towards remaining goals.  PT to assess readiness to d/c at end of POC.  Will issue laminated HEP and review with her at next pool session.  No pain throughout session.   REHAB POTENTIAL: Good  CLINICAL DECISION MAKING: Stable/uncomplicated  EVALUATION COMPLEXITY: Low   GOALS: Goals reviewed with patient? Yes  SHORT TERM GOALS: Target date: 12/01/23  ROM 0-120 Baseline: Goal  status: Achieved 2/4  2.  Full knee extension at heel strike in gait Baseline:  Goal status: Improved heel strike with gait achieved 2/4  3.  Able to ambulate a few steps without antalgic pattern, without AD Baseline:  Goal status: Achieved 2/4    LONG TERM GOALS: Target date: POC date   Meet FOTO goal Baseline:  Goal status:See above   2.  Perform stairs step over step, ascending and descending Baseline:  Goal status: MET - 02/01/24  3.  Average pain <=2/10 Baseline:  Goal status: Minimal pain achieved 2/5  4.  Return to gym program at [facility] Baseline:  Goal status: INITIAL    PLAN:  PT FREQUENCY: 1-2x/week  PT DURATION: 12 weeks  PLANNED INTERVENTIONS: 97164- PT Re-evaluation, 97110-Therapeutic exercises, 97530- Therapeutic activity, 97112- Neuromuscular re-education, 97535- Self Care, 54098- Manual therapy, [phone]- Gait training, [phone]- Aquatic Therapy, [phone]- Vasopneumatic device, Patient/Family education, Balance training, Stair training, Taping, Dry Needling, Joint mobilization, Spinal mobilization, Scar mobilization, Cryotherapy, and Moist heat.  PLAN FOR NEXT SESSION: ROM, glut activation and eccentric hamstrings   Mayer Camel, PTA 02/01/24 5:20 PM [facility] [address] [place], [place], [address] Phone: [phone]   Fax:  [phone]

## 2024-02-04 ENCOUNTER — Ambulatory Visit: Payer: Managed Care, Other (non HMO) | Admitting: Medical

## 2024-02-04 VITALS — BP 110/70 | HR 71 | Temp 98.7°F | Wt 196.8 lb

## 2024-02-04 DIAGNOSIS — M549 Dorsalgia, unspecified: Secondary | ICD-10-CM | POA: Diagnosis not present

## 2024-02-04 DIAGNOSIS — R109 Unspecified abdominal pain: Secondary | ICD-10-CM

## 2024-02-04 DIAGNOSIS — N1832 Chronic kidney disease, stage 3b: Secondary | ICD-10-CM | POA: Diagnosis not present

## 2024-02-04 LAB — POCT URINALYSIS DIP (PROADVANTAGE DEVICE)
Bilirubin, UA: NEGATIVE
Blood, UA: NEGATIVE
Glucose, UA: >=1000 mg/dL — AB
Ketones, POC UA: NEGATIVE mg/dL
Leukocytes, UA: NEGATIVE
Nitrite, UA: NEGATIVE
Protein Ur, POC: 100 mg/dL — AB
Specific Gravity, Urine: 1.015
Urobilinogen, Ur: NEGATIVE
pH, UA: 6 (ref 5.0–8.0)

## 2024-02-04 MED ORDER — TIZANIDINE HCL 4 MG PO TABS: 4.0000 mg | ORAL_TABLET | Freq: Every day | ORAL | 0 refills | Status: AC

## 2024-02-04 NOTE — Progress Notes (Signed)
 Subjective:  Lauren [name] is a 63 y.o. female who presents for Chief Complaint  Patient presents with   pain on left side    Pain on left side x 3 weeks. No problems with the using the bathroom     Here for complaint of pain in the left side x 3 weeks.  Pain is left mid to upper back pain.   If coughing or turning feels the pain.  No rash, no skin sensitivity.   No blood in urine, no urinary frequency or urgency.   No fall, no injury, no trauma.     No problems with bowels.   Pain is intermittent, worse with movement or cough.    In general, not having congestion, sore throat, regular cough.    No shortness of breath, no fever, no bodyaches or chills, no recent change in activity or strenuous lifting that would cause pain  No other aggravating or relieving factors.    No other c/o.  Past Medical History:  Diagnosis Date   Anemia    CKD stage 3b, GFR 30-44 ml/min (HCC)    Complication of anesthesia    Diabetes mellitus, type 2 (HCC)    Eczema    GERD (gastroesophageal reflux disease)    Glaucoma    Gout    History of kidney stones    Hyperlipidemia    Hypertension    Metabolic dysfunction-associated steatohepatitis (MASH)    PONV (postoperative nausea and vomiting)    Primary biliary cholangitis (HCC)    Sarcoidosis    Sleep apnea    Thyroid cancer [facility])    Past Surgical History:  Procedure Laterality Date   CATARACT EXTRACTION Bilateral    COLONOSCOPY     GASTRIC BYPASS     KNEE SURGERY Left 2017   LIVER BIOPSY  01/2022   Novosure     TOTAL KNEE REVISION Left 10/15/2023   Procedure: REVISION LEFT PARTIAL KNEE to TOTAL KNEE ARTHROPLASTY;  Surgeon: Tarry Kos, MD;  Location: [facility] OR;  Service: Orthopedics;  Laterality: Left;   TUBAL LIGATION     Current Outpatient Medications on File Prior to Visit  Medication Sig Dispense Refill   allopurinol (ZYLOPRIM) 100 MG tablet Take 2 tablets (200 mg total) by mouth daily. 180 tablet 3   amLODipine-valsartan (EXFORGE) 5-320  MG tablet Take 1 tablet by mouth daily.     colchicine 0.6 MG tablet 1 tablet BID prn for gout flare 90 tablet 0   Continuous Glucose Sensor (FREESTYLE LIBRE 2 SENSOR) MISC Use on back of arm 6 each 3   Cyanocobalamin (B-12 SL) Place 3 capsules under the tongue daily.     FARXIGA 10 MG TABS tablet Take 10 mg by mouth daily.     Finerenone (KERENDIA) 10 MG TABS Take 10 mg by mouth daily.     LUMIGAN 0.01 % SOLN Place 1 drop into both eyes at bedtime.     Multiple Vitamin (MULTIVITAMIN) capsule Take 1 capsule by mouth daily.     rosuvastatin (CRESTOR) 40 MG tablet Take 1 tablet (40 mg total) by mouth daily. 90 tablet 3   Semaglutide, 2 MG/DOSE, 8 MG/3ML SOPN Inject 2 mg into the skin once a week. 15 mL 2   ursodiol (ACTIGALL) 300 MG capsule Take 600 mg by mouth 2 (two) times daily.     amoxicillin (AMOXIL) 500 MG tablet Take 4 tablets by mouth 1 hour prior to dental procedure. (Patient not taking: Reported on 02/04/2024) 4 tablet 2  No current facility-administered medications on file prior to visit.    The following portions of the patient's history were reviewed and updated as appropriate: allergies, current medications, past family history, past medical history, past social history, past surgical history and problem list.  ROS Otherwise as in subjective above    Objective: BP 110/70   Pulse 71   Temp 98.7 F (37.1 C)   Wt 196 lb 12.8 oz (89.3 kg)   BMI 36.00 kg/m   Wt Readings from Last 3 Encounters:  02/04/24 196 lb 12.8 oz (89.3 kg)  10/15/23 210 lb (95.3 kg)  10/05/23 209 lb 14.4 oz (95.2 kg)    General appearance: alert, no distress, well developed, well nourished Heart: RRR, normal S1, S2, no murmurs Lungs: CTA bilaterally, no wheezes, rhonchi, or rales She is nontender of the back or chest wall, no obvious rash or discoloration on the left upper mid She does seem to have some pain in a specific area of her left upper paraspinal region around T7-T8 region paraspinal  when she is moving such as leaning back on the exam table to lay down to get up.  Otherwise nontender in this area. Abdomen: +bs, soft, non tender, non distended, no masses, no hepatomegaly, no splenomegaly Pulses: 2+ radial pulses, 2+ pedal pulses, normal cap refill Ext: no edema   Assessment: Encounter Diagnoses  Name Primary?   Upper back pain Yes   Side pain    Stage 3b chronic kidney disease (HCC)      Plan: Symptoms and exam would suggest a musculoskeletal cause of your pain such as latissimus dorsi muscles.  Typically this muscle could be injured pulling up to something or doing a pulling motion such as pulling a cart towards you or pulling against an object  Treatment recommendations include heat, massage, stretching and possibly using a muscle relaxer for a few days.  Do some gentle stretching daily, consider getting a massage, you can use heat such as hot bath or hot shower or hot towel twice daily  Begin short term use of muscle relaxer the next few nights, then as needed for spasm/pain.  Caution with sedation with this medicaiton.  Recheck kidney labs today at her request.   Zekiah was seen today for pain on left side.  Diagnoses and all orders for this visit:  Upper back pain -     POCT Urinalysis DIP (Proadvantage Device)  Side pain -     POCT Urinalysis DIP (Proadvantage Device)  Stage 3b chronic kidney disease (HCC) -     Renal Function Panel  Other orders -     tiZANidine (ZANAFLEX) 4 MG tablet; Take 1 tablet (4 mg total) by mouth at bedtime.    Follow up: Pending labs or if any new symptoms start

## 2024-02-05 ENCOUNTER — Ambulatory Visit (HOSPITAL_BASED_OUTPATIENT_CLINIC_OR_DEPARTMENT_OTHER): Payer: Managed Care, Other (non HMO) | Admitting: Physical Therapy

## 2024-02-05 ENCOUNTER — Encounter (HOSPITAL_BASED_OUTPATIENT_CLINIC_OR_DEPARTMENT_OTHER): Payer: Self-pay | Admitting: Physical Therapy

## 2024-02-05 ENCOUNTER — Ambulatory Visit: Payer: Managed Care, Other (non HMO) | Admitting: Medical

## 2024-02-05 DIAGNOSIS — M25562 Pain in left knee: Secondary | ICD-10-CM | POA: Diagnosis not present

## 2024-02-05 DIAGNOSIS — M25662 Stiffness of left knee, not elsewhere classified: Secondary | ICD-10-CM

## 2024-02-05 DIAGNOSIS — R262 Difficulty in walking, not elsewhere classified: Secondary | ICD-10-CM

## 2024-02-05 LAB — RENAL FUNCTION PANEL
Albumin: 4.7 g/dL (ref 3.9–4.9)
BUN/Creatinine Ratio: 9 — ABNORMAL LOW (ref 12–28)
BUN: 15 mg/dL (ref 8–27)
CO2: 19 mmol/L — ABNORMAL LOW (ref 20–29)
Calcium: 10 mg/dL (ref 8.7–10.3)
Chloride: 107 mmol/L — ABNORMAL HIGH (ref 96–106)
Creatinine, Ser: 1.6 mg/dL — ABNORMAL HIGH (ref 0.57–1.00)
Glucose: 96 mg/dL (ref 70–99)
Phosphorus: 3.1 mg/dL (ref 3.0–4.3)
Potassium: 4.3 mmol/L (ref 3.5–5.2)
Sodium: 145 mmol/L — ABNORMAL HIGH (ref 134–144)
eGFR: 36 mL/min/{1.73_m2} — ABNORMAL LOW (ref >59)

## 2024-02-05 NOTE — Therapy (Unsigned)
 OUTPATIENT PHYSICAL THERAPY TREATMENT   Patient Name: Lauren [name] MRN: [id number] DOB:[birth year], 63 y.o., female Today's Date: 02/06/2024  END OF SESSION:  PT End of Session - 02/06/24 0845     Visit Number 17    Number of Visits 25    Date for PT Re-Evaluation 02/09/24    Authorization Type [employer]    PT Start Time 1300    PT Stop Time 1343    PT Time Calculation (min) 43 min    Activity Tolerance Patient tolerated treatment well    Behavior During Therapy WFL for tasks assessed/performed              Past Medical History:  Diagnosis Date   Anemia    CKD stage 3b, GFR 30-44 ml/min (HCC)    Complication of anesthesia    Diabetes mellitus, type 2 (HCC)    Eczema    GERD (gastroesophageal reflux disease)    Glaucoma    Gout    History of kidney stones    Hyperlipidemia    Hypertension    Metabolic dysfunction-associated steatohepatitis (MASH)    PONV (postoperative nausea and vomiting)    Primary biliary cholangitis (HCC)    Sarcoidosis    Sleep apnea    Thyroid cancer (HCC)    Past Surgical History:  Procedure Laterality Date   CATARACT EXTRACTION Bilateral    COLONOSCOPY     GASTRIC BYPASS     KNEE SURGERY Left 2017   LIVER BIOPSY  01/2022   Novosure     TOTAL KNEE REVISION Left 10/15/2023   Procedure: REVISION LEFT PARTIAL KNEE to TOTAL KNEE ARTHROPLASTY;  Surgeon: Tarry Kos, MD;  Location: [facility] OR;  Service: Orthopedics;  Laterality: Left;   TUBAL LIGATION     Patient Active Problem List   Diagnosis Date Noted   Primary osteoarthritis of left knee 10/15/2023   Status post total left knee replacement 10/15/2023   Type 2 diabetes mellitus with hyperglycemia, without long-term current use of insulin (HCC) 02/20/2023   Aortic atherosclerosis (HCC) 02/20/2023   Primary biliary cholangitis (HCC) 10/06/2022   Kidney stone 06/30/2022   Encounter for health maintenance examination in adult 06/30/2022   History of gastric bypass 06/30/2022   Anemia  06/30/2022   Altered mental status 06/30/2022   Nausea and vomiting 06/30/2022   Other constipation 02/14/2022   Liver hematoma 02/05/2022   Elevated LFTs 01/12/2022   Allergic rhinitis due to pollen 09/12/2021   Pain in joints of both feet 09/12/2021   OSA (obstructive sleep apnea) 09/12/2021   Stage 3b chronic kidney disease (HCC) 07/18/2021   Elevated alkaline phosphatase level 07/18/2021   Elevated liver enzymes 07/18/2021   Chronic neck pain 07/13/2021   Chronic back pain 07/13/2021   Urge incontinence of urine 07/13/2021   Incontinence of feces 07/13/2021   Eczema 07/13/2021   Paresthesia of arm 07/13/2021   Status post left partial knee replacement 07/13/2021   History of thyroid cancer 07/13/2021   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   Hyperlipidemia 07/13/2021   Essential hypertension, benign 07/13/2021   Elevated uric acid in blood 07/13/2021   Insomnia 07/13/2021   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     REFERRING PROVIDER: Gershon Mussel, MD  REFERRING DIAG:  Z96.652 (ICD-10-CM) - H/O total knee replacement, left      Rationale for Evaluation and Treatment: Rehabilitation  THERAPY DIAG:  Acute pain of left knee  Difficulty in walking, not elsewhere classified  Stiffness of left knee, not elsewhere classified  ONSET DATE: DOS 10/15/23  Days since surgery: 113   SUBJECTIVE:                                                                                                                                                                                           SUBJECTIVE STATEMENT: Patient has a tragedy in her family.  She has not been able to work on much.   POOL ACCESS: pt is member of [employer], with membership on hold while in PT.   PERTINENT HISTORY:  Partial TKA 2018  PAIN:  Are you having pain? no: NPRS scale: 0/10 Pain location: L knee Pain description:  Aggravating factors: sitting Relieving factors: rest  PRECAUTIONS:  None  RED  FLAGS: None   WEIGHT BEARING RESTRICTIONS:  No  FALLS:  Has patient fallen in last 6 months? No  LIVING ENVIRONMENT: 14 steps at home  OCCUPATION:  Not working  PLOF:  Independent  PATIENT GOALS:  Stairs, walking, getting ready to move to [place] in June, play with grand children.    OBJECTIVE:  Note: Objective measures were completed at Evaluation unless otherwise noted.  PATIENT SURVEYS:  FOTO 41 ; goal of 70 on visit 16  01/22/24: 63  COGNITIVE STATUS: Within functional limits for tasks assessed   EDEMA:  Yes: Lt knee  GAIT: Eval: with RW, lacking full knee ext at heel strike   Body Part #1 Knee  PALPATION: Spongy end feel  LOWER EXTREMITY ROM:     Passive  Left eval Lt  12/14 Left  Left 1/15 Left  2/4  2/26  Hip flexion        Hip extension        Hip abduction        Hip adduction        Hip internal rotation        Hip external rotation        Knee flexion 100 114 115 107  120 123  Knee extension 0  4  0 0   (Blank rows = not tested)       TREATMENT:  2/25 Manual  PROM L knee Tib/fem mobilizations posterior glide grade II-III Patella mobilizations Extension stretching with distraction   There-ex  SLR 3x10  SAQ 3x12 3lbs   LAQ  2.5 lbs 3x12  Leg Press Cybex 50 lbs low RPE so moved to 70 lbs  Reviewed use of LF leg press  Knee extension bilateral 10 lbs RPE of 3 3x15  LF hip abduction 3x15 10 lbs   Reviewed right start program     [facility] Adult PT Treatment:                                                DATE: 02/01/24 Pt seen for aquatic therapy today.  Treatment took place in water 3.5-4.75 ft in depth at the [facility] pool. Temp of water was 91.  Pt entered/exited the pool via stairs in step-through pattern independently with bilat rail.  - marching backward/ forward with reciprocal row with  yellow hand floats - UE on yellow hand floats -> no support - 3 way toe taps x 10 - UE on yellow hand floats:  3 way LE kick x 10 - tandem gait without UE support in 90ft 6"   - squats (vertical trunk) pushing blue hand float under water 2 x 10 - SLS with noodle stomp without UE support with blue hollow noodle -> yellow noodle, each LE, trial fast and slow and  - 3 way LE stretch with back against wall ankle on noodle (ITB, hamstring, adductor)  - quad stretch with ankle on noodle - fig 4 at stairs   2/18 Manual  PROM L knee Tib/fem mobilizations posterior glide grade II-III Patella mobilizations  There-ex  SLR 3x10  Bridges 2x10  SAQ 3x12 2.5 lbs   LAQ  2.5 lbs 3x12  Nustep for self stretch x5 min  Seated hamstring curl 3x15 red  Seated hip abduction 3x15 green   Neuro-re-ed: Step onto air-ex fwd and lateral with left x20      [facility] Adult PT Treatment:                                                DATE: 01/25/24 Pt seen for aquatic therapy today.  Treatment took place in water 3.5-4.75 ft in depth at the [facility] pool. Temp of water was 91.  Pt entered/exited the pool via stairs in step-through pattern independently with bilat rail.  - walking unsupported: forward/ backward  2 laps - marching backward/ forward walking kicks  - side stepping with arm addct/ abdct with yellow hand floats, cues for decreased step length and increased step height - UE on wall: split squats x 10 each LE - light jogging forward/ backward  - UE on yellow hand floats:  leg swings into hip flex/ext and hip abdct/ addct crossing midline - intermittent UE support: 3 way toe tap in L SLS x 10 - light jogging forward with butt kickers (moderate knee discomfort)  - squats (vertical trunk) pushing yellow hand float under water 2 x 10 - 3 way LE stretch with back against wall ankle on noodle (ITB, hamstring, adductor)  - quad stretch with ankle on noodle  - discussed body mechanics for  getting out of tub  Treatment  01/22/2024: Blank lines following charge title = not provided on this treatment date.   Manual:  TPDN No PROM- 118deg flexion Patella mobilizations STM HS and quads There-ex: Nu-Step L5 x27min SLR 3x10  LAQ 3x10 3# Standing HSC 3# 3x10 There-Act: Bridges 2x15 Squats 3x10 Standing march x20 Self Care:  Nuro-Re-ed:  Investment banker, operational:    PATIENT EDUCATION:  Education details: Teacher, music of condition, exercise form/rationale Person educated: Spouse Education method: Explanation, Facilities manager, Actor cues, Verbal cues,  Education comprehension: verbalized understanding, returned demonstration, verbal cues required, tactile cues required, and needs further education  HOME EXERCISE PROGRAM:  [email or web address]  Access Code: [email or web address]  Aquatic Access Code: [email or web address] URL: [email or web address] Date: 02/01/2024  * not issued yet   ASSESSMENT:  CLINICAL IMPRESSION: The patient continues to progress well. She had no significant pain with gym activity.  Spoke with the patient about the right start program.  She is interested.  She feels comfortable with her exercises.  She is given the option to do a few more visits in the gym.  She did not say whether she plans on doing so.  She has full range of motion.  She has no further need for manual therapy.  She has 1 more pool session.  She will likely discharge to [facility] at that time.  We reviewed how to grade her exercises at home using RPE today. REHAB POTENTIAL: Good  CLINICAL DECISION MAKING: Stable/uncomplicated  EVALUATION COMPLEXITY: Low   GOALS: Goals reviewed with patient? Yes  SHORT TERM GOALS: Target date: 12/01/23  ROM 0-120 Baseline: Goal status: Achieved 2/4  2.  Full knee extension at heel strike in gait Baseline:  Goal status: Improved heel strike with gait achieved 2/4  3.  Able to ambulate a few steps without antalgic pattern, without  AD Baseline:  Goal status: Achieved 2/4    LONG TERM GOALS: Target date: POC date   Meet FOTO goal Baseline:  Goal status:See above   2.  Perform stairs step over step, ascending and descending Baseline:  Goal status: MET - 02/01/24  3.  Average pain <=2/10 Baseline:  Goal status: Minimal pain achieved 2/5  4.  Return to gym program at [facility] Baseline:  Goal status: INITIAL    PLAN:  PT FREQUENCY: 1-2x/week  PT DURATION: 12 weeks  PLANNED INTERVENTIONS: 97164- PT Re-evaluation, 97110-Therapeutic exercises, 97530- Therapeutic activity, 97112- Neuromuscular re-education, 97535- Self Care, 54098- Manual therapy, [phone]- Gait training, [phone]- Aquatic Therapy, [phone]- Vasopneumatic device, Patient/Family education, Balance training, Stair training, Taping, Dry Needling, Joint mobilization, Spinal mobilization, Scar mobilization, Cryotherapy, and Moist heat.  PLAN FOR NEXT SESSION: ROM, glut activation and eccentric hamstrings  Lorayne Bender PT DTaP  02/06/24 8:48 AM [facility] [address] [place], [place], [address] Phone: [phone]   Fax:  [phone]

## 2024-02-05 NOTE — Progress Notes (Signed)
 Results sent through MyChart

## 2024-02-06 ENCOUNTER — Encounter (HOSPITAL_BASED_OUTPATIENT_CLINIC_OR_DEPARTMENT_OTHER): Payer: Self-pay | Admitting: Physical Therapy

## 2024-02-08 ENCOUNTER — Encounter (HOSPITAL_BASED_OUTPATIENT_CLINIC_OR_DEPARTMENT_OTHER): Payer: Self-pay | Admitting: Physical Therapy

## 2024-02-08 ENCOUNTER — Ambulatory Visit (HOSPITAL_BASED_OUTPATIENT_CLINIC_OR_DEPARTMENT_OTHER): Payer: Managed Care, Other (non HMO) | Admitting: Physical Therapy

## 2024-02-08 DIAGNOSIS — M25562 Pain in left knee: Secondary | ICD-10-CM | POA: Diagnosis not present

## 2024-02-08 DIAGNOSIS — M25662 Stiffness of left knee, not elsewhere classified: Secondary | ICD-10-CM

## 2024-02-08 DIAGNOSIS — R262 Difficulty in walking, not elsewhere classified: Secondary | ICD-10-CM

## 2024-02-08 NOTE — Therapy (Signed)
 OUTPATIENT PHYSICAL THERAPY TREATMENT   Patient Name: Lauren [name] MRN: [id number] DOB:1961-07-26, 63 y.o., female Today's Date: 02/08/2024  END OF SESSION:  PT End of Session - 02/08/24 1313     Visit Number 18    Number of Visits 25    Date for PT Re-Evaluation 02/09/24    Authorization Type [employer]    PT Start Time 1300    PT Stop Time 1340    PT Time Calculation (min) 40 min    Behavior During Therapy WFL for tasks assessed/performed              Past Medical History:  Diagnosis Date   Anemia    CKD stage 3b, GFR 30-44 ml/min (HCC)    Complication of anesthesia    Diabetes mellitus, type 2 (HCC)    Eczema    GERD (gastroesophageal reflux disease)    Glaucoma    Gout    History of kidney stones    Hyperlipidemia    Hypertension    Metabolic dysfunction-associated steatohepatitis (MASH)    PONV (postoperative nausea and vomiting)    Primary biliary cholangitis (HCC)    Sarcoidosis    Sleep apnea    Thyroid cancer (HCC)    Past Surgical History:  Procedure Laterality Date   CATARACT EXTRACTION Bilateral    COLONOSCOPY     GASTRIC BYPASS     KNEE SURGERY Left 2017   LIVER BIOPSY  01/2022   Novosure     TOTAL KNEE REVISION Left 10/15/2023   Procedure: REVISION LEFT PARTIAL KNEE to TOTAL KNEE ARTHROPLASTY;  Surgeon: Tarry Kos, MD;  Location: [facility] OR;  Service: Orthopedics;  Laterality: Left;   TUBAL LIGATION     Patient Active Problem List   Diagnosis Date Noted   Primary osteoarthritis of left knee 10/15/2023   Status post total left knee replacement 10/15/2023   Type 2 diabetes mellitus with hyperglycemia, without long-term current use of insulin (HCC) 02/20/2023   Aortic atherosclerosis (HCC) 02/20/2023   Primary biliary cholangitis (HCC) 10/06/2022   Kidney stone 06/30/2022   Encounter for health maintenance examination in adult 06/30/2022   History of gastric bypass 06/30/2022   Anemia 06/30/2022   Altered mental status 06/30/2022   Nausea and  vomiting 06/30/2022   Other constipation 02/14/2022   Liver hematoma 02/05/2022   Elevated LFTs 01/12/2022   Allergic rhinitis due to pollen 09/12/2021   Pain in joints of both feet 09/12/2021   OSA (obstructive sleep apnea) 09/12/2021   Stage 3b chronic kidney disease (HCC) 07/18/2021   Elevated alkaline phosphatase level 07/18/2021   Elevated liver enzymes 07/18/2021   Chronic neck pain 07/13/2021   Chronic back pain 07/13/2021   Urge incontinence of urine 07/13/2021   Incontinence of feces 07/13/2021   Eczema 07/13/2021   Paresthesia of arm 07/13/2021   Status post left partial knee replacement 07/13/2021   History of thyroid cancer 07/13/2021   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   Hyperlipidemia 07/13/2021   Essential hypertension, benign 07/13/2021   Elevated uric acid in blood 07/13/2021   Insomnia 07/13/2021   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     REFERRING PROVIDER: Gershon Mussel, MD  REFERRING DIAG:  Z96.652 (ICD-10-CM) - H/O total knee replacement, left      Rationale for Evaluation and Treatment: Rehabilitation  THERAPY DIAG:  Acute pain of left knee  Difficulty in walking, not elsewhere classified  Stiffness of left knee, not elsewhere classified  ONSET DATE: DOS 10/15/23  Days since surgery:  116   SUBJECTIVE:                                                                                                                                                                                           SUBJECTIVE STATEMENT: Patient has a tragedy in her family. She will be traveling a lot in upcoming weeks for funeral and graduation.    POOL ACCESS: pt is member of [employer], with membership on hold while in PT.   PERTINENT HISTORY:  Partial TKA 2018  PAIN:  Are you having pain? no: NPRS scale: 0/10 Pain location: L knee Pain description:  Aggravating factors: sitting Relieving factors: rest  PRECAUTIONS:  None  RED FLAGS: None   WEIGHT  BEARING RESTRICTIONS:  No  FALLS:  Has patient fallen in last 6 months? No  LIVING ENVIRONMENT: 14 steps at home  OCCUPATION:  Not working  PLOF:  Independent  PATIENT GOALS:  Stairs, walking, getting ready to move to [place] in June, play with grand children.    OBJECTIVE:  Note: Objective measures were completed at Evaluation unless otherwise noted.  PATIENT SURVEYS:  FOTO 41 ; goal of 70 on visit 16  01/22/24: 63  COGNITIVE STATUS: Within functional limits for tasks assessed   EDEMA:  Yes: Lt knee  GAIT: Eval: with RW, lacking full knee ext at heel strike   Body Part #1 Knee  PALPATION: Spongy end feel  LOWER EXTREMITY ROM:     Passive  Left eval Lt  12/14 Left  Left 1/15 Left  2/4  2/26  Hip flexion        Hip extension        Hip abduction        Hip adduction        Hip internal rotation        Hip external rotation        Knee flexion 100 114 115 107  120 123  Knee extension 0  4  0 0   (Blank rows = not tested)       TREATMENT:  [facility] Adult PT Treatment:                                                DATE: 02/08/24 Pt seen for aquatic therapy today.  Treatment took place in water 3.5-4.75 ft in depth at the [facility] pool. Temp of water was 91.  Pt entered/exited the pool via stairs in step-through pattern independently with bilat rail.  - side stepping with arm addct/ abdct with yellow hand floats  - UE on yellow hand floats- 3 way toe taps x 10 - UE on yellow hand floats:  3 way LE kick x 10 - farmer carry with single yellow hand float at side, walking forward/ backward - marching forward/ backward  - squats (vertical trunk) pushing blue hand float under water 20 - SLS with noodle stomp without UE support with yellow noodle, each LE x10 fast, 10 slow - into hip flex and in abdct - staggered stance with  kick board row - calf stretch with bilat heels off of step x 20s - runners step ups x 10 - 3 way LE stretch with back against wall ankle on noodle (ITB, hamstring, adductor)  - quad stretch with ankle on noodle   2/25 Manual  PROM L knee Tib/fem mobilizations posterior glide grade II-III Patella mobilizations Extension stretching with distraction   There-ex  SLR 3x10  SAQ 3x12 3lbs   LAQ  2.5 lbs 3x12  Leg Press Cybex 50 lbs low RPE so moved to 70 lbs  Reviewed use of LF leg press  Knee extension bilateral 10 lbs RPE of 3 3x15  LF hip abduction 3x15 10 lbs   Reviewed right start program     [facility] Adult PT Treatment:                                                DATE: 02/01/24 Pt seen for aquatic therapy today.  Treatment took place in water 3.5-4.75 ft in depth at the [facility] pool. Temp of water was 91.  Pt entered/exited the pool via stairs in step-through pattern independently with bilat rail.  - marching backward/ forward with reciprocal row with yellow hand floats - UE on yellow hand floats -> no support - 3 way toe taps x 10 - UE on yellow hand floats:  3 way LE kick x 10 - tandem gait without UE support in 47ft 6"   - squats (vertical trunk) pushing blue hand float under water 2 x 10 - SLS with noodle stomp without UE support with blue hollow noodle -> yellow noodle, each LE, trial fast and slow and  - 3 way LE stretch with back against wall ankle on noodle (ITB, hamstring, adductor)  - quad stretch with ankle on noodle - fig 4 at stairs   2/18 Manual  PROM L knee Tib/fem mobilizations posterior glide grade II-III Patella mobilizations  There-ex  SLR 3x10  Bridges 2x10  SAQ 3x12 2.5 lbs   LAQ  2.5 lbs 3x12  Nustep for self stretch x5 min  Seated hamstring curl 3x15 red  Seated hip abduction 3x15 green   Neuro-re-ed: Step onto air-ex fwd and lateral with left x20      [facility] Adult PT  Treatment:                                                 DATE: 01/25/24 Pt seen for aquatic therapy today.  Treatment took place in water 3.5-4.75 ft in depth at the [facility] pool. Temp of water was 91.  Pt entered/exited the pool via stairs in step-through pattern independently with bilat rail.  - walking unsupported: forward/ backward  2 laps - marching backward/ forward walking kicks  - side stepping with arm addct/ abdct with yellow hand floats, cues for decreased step length and increased step height - UE on wall: split squats x 10 each LE - light jogging forward/ backward  - UE on yellow hand floats:  leg swings into hip flex/ext and hip abdct/ addct crossing midline - intermittent UE support: 3 way toe tap in L SLS x 10 - light jogging forward with butt kickers (moderate knee discomfort)  - squats (vertical trunk) pushing yellow hand float under water 2 x 10 - 3 way LE stretch with back against wall ankle on noodle (ITB, hamstring, adductor)  - quad stretch with ankle on noodle  - discussed body mechanics for getting out of tub  Treatment                            01/22/2024: Blank lines following charge title = not provided on this treatment date.   Manual:  TPDN No PROM- 118deg flexion Patella mobilizations STM HS and quads There-ex: Nu-Step L5 x39min SLR 3x10  LAQ 3x10 3# Standing HSC 3# 3x10 There-Act: Bridges 2x15 Squats 3x10 Standing march x20   PATIENT EDUCATION:  Education details:  exercise form/rationale Person educated: patient Education method: Explanation, Demonstration, Tactile cues, Verbal cues,  Education comprehension: verbalized understanding, returned demonstration, verbal cues required, tactile cues required, and needs further education  HOME EXERCISE PROGRAM:  [email or web address]  Access Code: [email or web address]  Aquatic Access Code: [email or web address] URL: [email or web address] This aquatic home exercise program from MedBridge utilizes pictures from land based exercises, but has been  adapted prior to lamination and issuance.     ASSESSMENT:  CLINICAL IMPRESSION: Pt was issued laminated aquatic HEP and verbally reviewed. She has met all but the FOTO goal.  Pt verbalized readiness to d/c and is pleased with current level of function.   REHAB POTENTIAL: Good  CLINICAL DECISION MAKING: Stable/uncomplicated  EVALUATION COMPLEXITY: Low   GOALS: Goals reviewed with patient? Yes  SHORT TERM GOALS: Target date: 12/01/23  ROM 0-120 Baseline: Goal status: Achieved 2/4  2.  Full knee extension at heel strike in gait Baseline:  Goal status: Improved heel strike with gait-  Achieved 2/4  3.  Able to ambulate a few steps without antalgic pattern, without AD Baseline:  Goal status: Achieved 2/4    LONG TERM GOALS: Target date: POC date   Meet FOTO goal Baseline: not met 01/26/24 Goal status:See above   2.  Perform stairs step over step, ascending and descending Baseline:  Goal status: MET - 02/01/24  3.  Average pain <=2/10 Baseline:  Goal status: Minimal pain - achieved 2/5  4.  [facility] Baseline:  Goal status: met -02/08/24    PLAN:  PT FREQUENCY: 1-2x/week  PT DURATION: 12 weeks  PLANNED INTERVENTIONS: 97164- PT Re-evaluation, 97110-Therapeutic exercises, 97530-  Therapeutic activity, [id number]- Neuromuscular re-education, [phone]- Self Care, 19147- Manual therapy, [phone]- Gait training, [phone]- Aquatic Therapy, [phone]- Vasopneumatic device, Patient/Family education, Balance training, Stair training, Taping, Dry Needling, Joint mobilization, Spinal mobilization, Scar mobilization, Cryotherapy, and Moist heat.   Mayer Camel, PTA 02/08/24 1:52 PM [facility] [address] [place], [place], [address] Phone: [phone]   Fax:  [phone]

## 2024-02-11 ENCOUNTER — Other Ambulatory Visit: Payer: Self-pay | Admitting: Medical

## 2024-02-11 DIAGNOSIS — R0789 Other chest pain: Secondary | ICD-10-CM

## 2024-02-12 ENCOUNTER — Ambulatory Visit
Admission: RE | Admit: 2024-02-12 | Discharge: 2024-02-12 | Disposition: A | Discharge status: Home / Self Care | Source: Ambulatory Visit | Attending: Medical | Admitting: Medical

## 2024-02-12 DIAGNOSIS — R0789 Other chest pain: Secondary | ICD-10-CM

## 2024-02-12 NOTE — Progress Notes (Signed)
 Results sent through MyChart

## 2024-02-13 ENCOUNTER — Other Ambulatory Visit: Payer: Self-pay | Admitting: Medical

## 2024-02-13 MED ORDER — TRAMADOL HCL ER 100 MG PO TB24: 100.0000 mg | ORAL_TABLET | Freq: Every day | ORAL | 0 refills | Status: AC

## 2024-02-13 MED ORDER — PREDNISONE 10 MG PO TABS: ORAL_TABLET | ORAL | 0 refills | Status: AC

## 2024-02-14 ENCOUNTER — Other Ambulatory Visit (HOSPITAL_COMMUNITY): Payer: Self-pay

## 2024-02-14 ENCOUNTER — Telehealth: Payer: Self-pay

## 2024-02-14 NOTE — Telephone Encounter (Signed)
 Please note that I have received addt information on 3/5/25for a  Prior Authorization pertaining to the pts Rx tramadol. This information was completed and faxed back on 02/14/24. Status is pending

## 2024-02-15 ENCOUNTER — Other Ambulatory Visit (HOSPITAL_COMMUNITY): Payer: Self-pay

## 2024-02-15 ENCOUNTER — Other Ambulatory Visit: Payer: Self-pay | Admitting: Medical

## 2024-02-15 ENCOUNTER — Telehealth: Payer: Self-pay

## 2024-02-15 MED ORDER — ACETAMINOPHEN-CODEINE 300-30 MG PO TABS: 1.0000 | ORAL_TABLET | Freq: Two times a day (BID) | ORAL | 0 refills | Status: AC | PRN

## 2024-02-15 NOTE — Telephone Encounter (Signed)
 Pharmacy Patient Advocate Encounter   Received notification from Physician's Office that prior authorization for traMADol HCl ER 100MG  er tablets is required/requested.   Insurance verification completed.   The patient is insured through [employer] .   Per test claim: PA required; PA submitted to above mentioned insurance via CoverMyMeds Key/confirmation #/EOC (Key: [id number])       Status is pending

## 2024-02-17 NOTE — Progress Notes (Unsigned)
 Post-Op Visit Note   Patient: Lauren [name]           Date of Birth: [birth year]           MRN: [id number] Visit Date: 02/19/2024 PCP: Jac Canavan, PA-C   Assessment & Plan:  Chief Complaint: No chief complaint on file.  Visit Diagnoses: No diagnosis found.  Plan: ***  Follow-Up Instructions: No follow-ups on file.   Orders:  No orders of the defined types were placed in this encounter.  No orders of the defined types were placed in this encounter.   Imaging: No results found.  PMFS History: Patient Active Problem List   Diagnosis Date Noted   Primary osteoarthritis of left knee 10/15/2023   Status post total left knee replacement 10/15/2023   Type 2 diabetes mellitus with hyperglycemia, without long-term current use of insulin (HCC) 02/20/2023   Aortic atherosclerosis (HCC) 02/20/2023   Primary biliary cholangitis (HCC) 10/06/2022   Kidney stone 06/30/2022   Encounter for health maintenance examination in adult 06/30/2022   History of gastric bypass 06/30/2022   Anemia 06/30/2022   Altered mental status 06/30/2022   Nausea and vomiting 06/30/2022   Other constipation 02/14/2022   Liver hematoma 02/05/2022   Elevated LFTs 01/12/2022   Allergic rhinitis due to pollen 09/12/2021   Pain in joints of both feet 09/12/2021   OSA (obstructive sleep apnea) 09/12/2021   Stage 3b chronic kidney disease (HCC) 07/18/2021   Elevated alkaline phosphatase level 07/18/2021   Elevated liver enzymes 07/18/2021   Chronic neck pain 07/13/2021   Chronic back pain 07/13/2021   Urge incontinence of urine 07/13/2021   Incontinence of feces 07/13/2021   Eczema 07/13/2021   Paresthesia of arm 07/13/2021   Status post left partial knee replacement 07/13/2021   History of thyroid cancer 07/13/2021   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   Hyperlipidemia 07/13/2021   Essential hypertension, benign 07/13/2021   Elevated uric acid in blood 07/13/2021   Insomnia 07/13/2021    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   Past Medical History:  Diagnosis Date   Anemia    CKD stage 3b, GFR 30-44 ml/min (HCC)    Complication of anesthesia    Diabetes mellitus, type 2 (HCC)    Eczema    GERD (gastroesophageal reflux disease)    Glaucoma    Gout    History of kidney stones    Hyperlipidemia    Hypertension    Metabolic dysfunction-associated steatohepatitis (MASH)    PONV (postoperative nausea and vomiting)    Primary biliary cholangitis (HCC)    Sarcoidosis    Sleep apnea    Thyroid cancer (HCC)     Family History  Problem Relation Age of Onset   Breast cancer Mother    Breast cancer Maternal Grandmother    Diabetes Mellitus II Paternal Grandfather    Throat cancer Paternal Grandfather    Stomach cancer Neg Hx    Esophageal cancer Neg Hx    Pancreatic cancer Neg Hx    Colon cancer Neg Hx     Past Surgical History:  Procedure Laterality Date   CATARACT EXTRACTION Bilateral    COLONOSCOPY     GASTRIC BYPASS     KNEE SURGERY Left 2017   LIVER BIOPSY  01/2022   Novosure     TOTAL KNEE REVISION Left 10/15/2023   Procedure: REVISION LEFT PARTIAL KNEE to TOTAL KNEE ARTHROPLASTY;  Surgeon: Tarry Kos, MD;  Location: [facility] OR;  Service: Orthopedics;  Laterality: Left;  TUBAL LIGATION     Social History   Occupational History   Not on file  Tobacco Use   Smoking status: Former    Current packs/day: 0.00    Average packs/day: 1.3 packs/day for 22.0 years (27.5 ttl pk-yrs)    Types: E-cigarettes, Cigarettes    Start date: 52    Quit date: 2002    Years since quitting: 23.2   Smokeless tobacco: Never   Tobacco comments:    Stopped smoking 85yrs ago  Vaping Use   Vaping status: Never Used  Substance and Sexual Activity   Alcohol use: Yes    Comment: Occ.   Drug use: Not Currently    Comment: gummies   Sexual activity: Yes

## 2024-02-18 NOTE — Telephone Encounter (Signed)
 Pharmacy Patient Advocate Encounter  Received notification from [facility] that Prior Authorization for TRAMADOL 100 TAB ER 24HR has been DENIED.  Full denial letter will be uploaded to the media tab. See denial reason below.  Initial denial

## 2024-02-19 ENCOUNTER — Other Ambulatory Visit (INDEPENDENT_AMBULATORY_CARE_PROVIDER_SITE_OTHER): Payer: Self-pay

## 2024-02-19 ENCOUNTER — Ambulatory Visit: Payer: Managed Care, Other (non HMO) | Admitting: Orthopaedic Surgery

## 2024-02-19 DIAGNOSIS — Z96652 Presence of left artificial knee joint: Secondary | ICD-10-CM

## 2024-02-21 ENCOUNTER — Ambulatory Visit: Payer: Self-pay | Admitting: Medical

## 2024-02-21 ENCOUNTER — Other Ambulatory Visit (HOSPITAL_COMMUNITY): Payer: Self-pay

## 2024-02-21 NOTE — Telephone Encounter (Signed)
 UPDATE:  I have called to check status of tramadol .Insurance states they have until 22nd of march to make a decision but also states that it wont take that long.  [phone]

## 2024-02-21 NOTE — Telephone Encounter (Signed)
 Left message for pt to call me back

## 2024-02-21 NOTE — Telephone Encounter (Signed)
 Copied from CRM [id number]. Topic: Clinical - Prescription Issue >> Feb 21, 2024  4:15 PM Marlow Baars wrote: Reason for CRM: Wynona Canes pharmacist with [facility] called in wanting to speak with a nurse regarding acetaminophen-codeine (TYLENOL #3) 300-30 MG tablet. She states the patient has had a reaction with Oxycodone which has tylenol sO she would like to speak with a nurse. I will transfer her to E2C2 NT    Wynona Canes from [facility] pharmacy stated that they received a prescription for Acetaminophen-codeine (Tylenol #3) 300-30 mg tablet. She stated that their system shows an allergy to Oxycodone. She is inquiring if patient is able to tolerate this medication. Please advise.  Contact information Phone Number: [phone] Reference # [id number]

## 2024-02-22 NOTE — Telephone Encounter (Signed)
 Left message for pt

## 2024-02-25 ENCOUNTER — Other Ambulatory Visit (HOSPITAL_COMMUNITY): Payer: Self-pay

## 2024-02-25 NOTE — Telephone Encounter (Signed)
 Pharmacy Patient Advocate Encounter  Received notification from [facility] that Prior Authorization for Tramadol has been DENIED.  Full denial letter will be uploaded to the media tab. See denial reason below.                Also Alt therapy was sent in

## 2024-02-29 ENCOUNTER — Other Ambulatory Visit: Payer: Self-pay | Admitting: Medical

## 2024-02-29 ENCOUNTER — Telehealth: Payer: Self-pay | Admitting: *Deleted

## 2024-02-29 ENCOUNTER — Encounter: Payer: Self-pay | Admitting: Internal Medicine

## 2024-02-29 DIAGNOSIS — R079 Chest pain, unspecified: Secondary | ICD-10-CM

## 2024-02-29 NOTE — Telephone Encounter (Signed)
 Patient called back and states that she can tolerate codeine just not oxycotin . [facility] cancelled out the rx and pt would like you to re-send in. You can put on note, she can tolerate codeine for the tylenol #3

## 2024-02-29 NOTE — Progress Notes (Signed)
 Marland Kitchen  dc

## 2024-02-29 NOTE — Telephone Encounter (Signed)
 Pt would like to do CT chest first to make sure everything is ok. Normal activities she is ok, but coughing or laying down, when she will have pain

## 2024-02-29 NOTE — Telephone Encounter (Signed)
 Patient returned your call.

## 2024-03-18 ENCOUNTER — Ambulatory Visit
Admission: RE | Admit: 2024-03-18 | Discharge: 2024-03-18 | Disposition: A | Discharge status: Home / Self Care | Source: Ambulatory Visit | Attending: Medical | Admitting: Medical

## 2024-03-18 DIAGNOSIS — R079 Chest pain, unspecified: Secondary | ICD-10-CM

## 2024-03-21 ENCOUNTER — Telehealth: Payer: Self-pay | Admitting: Medical

## 2024-03-21 NOTE — Progress Notes (Signed)
 Scan didn't show anything new or worrisome.  If still having pain, describe the pain, what makes it worse?  Any issues with breathing, palpitations, out of breath easy with exercise?

## 2024-03-21 NOTE — Telephone Encounter (Signed)
(  Pt is aware Vincenza Hews is out of the office next week)  Response to imaging results: She states the pain feels internal around the area a heimlich maneuver is done when someone is choking. When she is laying in bed and has to change positions it irritates it. It has been worsening in the last 4 days and the pain is consistent and sharp but a heating pad does help. It hurts more when she coughs, yawns, twist, or stretches. Although she has no issues breathing.  She asks if Martie Lee can please give her a call on Monday.

## 2024-03-25 NOTE — Telephone Encounter (Signed)
 Sent to Dr.Lalonde since Jimmye Moulds is out this week

## 2024-03-31 ENCOUNTER — Encounter: Payer: Self-pay | Admitting: Internal Medicine

## 2024-03-31 NOTE — Telephone Encounter (Signed)
 Called and left message for pt and also sent her a mychart to read and respond that way would be faster

## 2024-04-30 ENCOUNTER — Other Ambulatory Visit: Payer: Self-pay | Admitting: *Deleted

## 2024-04-30 MED ORDER — FREESTYLE LIBRE 2 PLUS SENSOR MISC: 1.0000 | 1 refills | Status: AC

## 2024-05-07 ENCOUNTER — Other Ambulatory Visit: Payer: Self-pay | Admitting: Obstetrics and Gynecology

## 2024-05-07 DIAGNOSIS — Z1231 Encounter for screening mammogram for malignant neoplasm of breast: Secondary | ICD-10-CM

## 2024-05-13 ENCOUNTER — Ambulatory Visit
Admission: RE | Admit: 2024-05-13 | Discharge: 2024-05-13 | Disposition: A | Discharge status: Home / Self Care | Source: Ambulatory Visit | Attending: Obstetrics and Gynecology | Admitting: Obstetrics and Gynecology

## 2024-05-13 DIAGNOSIS — Z1231 Encounter for screening mammogram for malignant neoplasm of breast: Secondary | ICD-10-CM

## 2024-05-20 ENCOUNTER — Ambulatory Visit: Admitting: Orthopaedic Surgery

## 2024-07-22 ENCOUNTER — Other Ambulatory Visit: Payer: Self-pay | Admitting: Medical

## 2024-07-22 NOTE — Telephone Encounter (Signed)
 Moved to [place] 

## 2024-10-13 ENCOUNTER — Encounter: Payer: Self-pay | Admitting: Radiology

## 2024-10-20 ENCOUNTER — Other Ambulatory Visit: Payer: Self-pay | Admitting: Medical

## 2024-11-09 ENCOUNTER — Other Ambulatory Visit: Payer: Self-pay | Admitting: Medical
